# Patient Record
Sex: Male | Born: 1935
Health system: Southern US, Community
[De-identification: ages and names within clinical notes are randomized; demographics above are authoritative.]

## PROBLEM LIST (undated history)

## (undated) DIAGNOSIS — I4510 Unspecified right bundle-branch block: Secondary | ICD-10-CM

## (undated) DIAGNOSIS — R972 Elevated prostate specific antigen [PSA]: Secondary | ICD-10-CM

## (undated) DIAGNOSIS — I5189 Other ill-defined heart diseases: Secondary | ICD-10-CM

## (undated) DIAGNOSIS — I10 Essential (primary) hypertension: Secondary | ICD-10-CM

## (undated) DIAGNOSIS — Z9289 Personal history of other medical treatment: Secondary | ICD-10-CM

## (undated) DIAGNOSIS — I493 Ventricular premature depolarization: Secondary | ICD-10-CM

## (undated) DIAGNOSIS — C61 Malignant neoplasm of prostate: Secondary | ICD-10-CM

## (undated) HISTORY — DX: Essential (primary) hypertension: I10

## (undated) HISTORY — DX: Personal history of other medical treatment: Z92.89

## (undated) HISTORY — DX: Ventricular premature depolarization: I49.3

## (undated) HISTORY — DX: Elevated prostate specific antigen (PSA): R97.20

## (undated) HISTORY — DX: Unspecified right bundle-branch block: I45.10

## (undated) HISTORY — PX: NO PAST SURGERIES: SHX2092

## (undated) HISTORY — DX: Other ill-defined heart diseases: I51.89

## (undated) HISTORY — DX: Malignant neoplasm of prostate: C61

---

## 2015-05-22 DIAGNOSIS — M7520 Bicipital tendinitis, unspecified shoulder: Secondary | ICD-10-CM | POA: Insufficient documentation

## 2016-09-14 ENCOUNTER — Ambulatory Visit (INDEPENDENT_AMBULATORY_CARE_PROVIDER_SITE_OTHER): Payer: Medicare Other | Admitting: Family Medicine

## 2016-09-14 ENCOUNTER — Encounter: Payer: Self-pay | Admitting: Family Medicine

## 2016-09-14 VITALS — BP 142/72 | HR 40 | Temp 97.4°F | Ht 69.0 in | Wt 170.4 lb

## 2016-09-14 DIAGNOSIS — Z0001 Encounter for general adult medical examination with abnormal findings: Secondary | ICD-10-CM | POA: Insufficient documentation

## 2016-09-14 DIAGNOSIS — Z0110 Encounter for examination of ears and hearing without abnormal findings: Secondary | ICD-10-CM | POA: Insufficient documentation

## 2016-09-14 DIAGNOSIS — R001 Bradycardia, unspecified: Secondary | ICD-10-CM

## 2016-09-14 NOTE — Progress Notes (Signed)
Pre visit review using our clinic review tool, if applicable. No additional management support is needed unless otherwise documented below in the visit note. 

## 2016-09-14 NOTE — Patient Instructions (Signed)
We will call with your labs.  Follow up annually.  Take care  Dr. Adriana Simas   Health Maintenance, Male A healthy lifestyle and preventive care is important for your health and wellness. Ask your health care provider about what schedule of regular examinations is right for you. What should I know about weight and diet?  Eat a Healthy Diet  Eat plenty of vegetables, fruits, whole grains, low-fat dairy products, and lean protein.  Do not eat a lot of foods high in solid fats, added sugars, or salt. Maintain a Healthy Weight  Regular exercise can help you achieve or maintain a healthy weight. You should:  Do at least 150 minutes of exercise each week. The exercise should increase your heart rate and make you sweat (moderate-intensity exercise).  Do strength-training exercises at least twice a week. Watch Your Levels of Cholesterol and Blood Lipids  Have your blood tested for lipids and cholesterol every 5 years starting at 81 years of age. If you are at high risk for heart disease, you should start having your blood tested when you are 81 years old. You may need to have your cholesterol levels checked more often if:  Your lipid or cholesterol levels are high.  You are older than 81 years of age.  You are at high risk for heart disease. What should I know about cancer screening? Many types of cancers can be detected early and may often be prevented. Lung Cancer  You should be screened every year for lung cancer if:  You are a current smoker who has smoked for at least 30 years.  You are a former smoker who has quit within the past 15 years.  Talk to your health care provider about your screening options, when you should start screening, and how often you should be screened. Colorectal Cancer  Routine colorectal cancer screening usually begins at 81 years of age and should be repeated every 5-10 years until you are 81 years old. You may need to be screened more often if early forms  of precancerous polyps or small growths are found. Your health care provider may recommend screening at an earlier age if you have risk factors for colon cancer.  Your health care provider may recommend using home test kits to check for hidden blood in the stool.  A small camera at the end of a tube can be used to examine your colon (sigmoidoscopy or colonoscopy). This checks for the earliest forms of colorectal cancer. Prostate and Testicular Cancer  Depending on your age and overall health, your health care provider may do certain tests to screen for prostate and testicular cancer.  Talk to your health care provider about any symptoms or concerns you have about testicular or prostate cancer. Skin Cancer  Check your skin from head to toe regularly.  Tell your health care provider about any new moles or changes in moles, especially if:  There is a change in a mole's size, shape, or color.  You have a mole that is larger than a pencil eraser.  Always use sunscreen. Apply sunscreen liberally and repeat throughout the day.  Protect yourself by wearing long sleeves, pants, a wide-brimmed hat, and sunglasses when outside. What should I know about heart disease, diabetes, and high blood pressure?  If you are 22-51 years of age, have your blood pressure checked every 3-5 years. If you are 91 years of age or older, have your blood pressure checked every year. You should have your blood pressure  measured twice-once when you are at a hospital or clinic, and once when you are not at a hospital or clinic. Record the average of the two measurements. To check your blood pressure when you are not at a hospital or clinic, you can use:  An automated blood pressure machine at a pharmacy.  A home blood pressure monitor.  Talk to your health care provider about your target blood pressure.  If you are between 6-43 years old, ask your health care provider if you should take aspirin to prevent heart  disease.  Have regular diabetes screenings by checking your fasting blood sugar level.  If you are at a normal weight and have a low risk for diabetes, have this test once every three years after the age of 80.  If you are overweight and have a high risk for diabetes, consider being tested at a younger age or more often.  A one-time screening for abdominal aortic aneurysm (AAA) by ultrasound is recommended for men aged 76-75 years who are current or former smokers. What should I know about preventing infection? Hepatitis B  If you have a higher risk for hepatitis B, you should be screened for this virus. Talk with your health care provider to find out if you are at risk for hepatitis B infection. Hepatitis C  Blood testing is recommended for:  Everyone born from [birth year] through 1963.  Anyone with known risk factors for hepatitis C. Sexually Transmitted Diseases (STDs)  You should be screened each year for STDs including gonorrhea and chlamydia if:  You are sexually active and are younger than 80 years of age.  You are older than 81 years of age and your health care provider tells you that you are at risk for this type of infection.  Your sexual activity has changed since you were last screened and you are at an increased risk for chlamydia or gonorrhea. Ask your health care provider if you are at risk.  Talk with your health care provider about whether you are at high risk of being infected with HIV. Your health care provider may recommend a prescription medicine to help prevent HIV infection. What else can I do?  Schedule regular health, dental, and eye exams.  Stay current with your vaccines (immunizations).  Do not use any tobacco products, such as cigarettes, chewing tobacco, and e-cigarettes. If you need help quitting, ask your health care provider.  Limit alcohol intake to no more than 2 drinks per day. One drink equals 12 ounces of beer, 5 ounces of wine, or 1 ounces of hard  liquor.  Do not use street drugs.  Do not share needles.  Ask your health care provider for help if you need support or information about quitting drugs.  Tell your health care provider if you often feel depressed.  Tell your health care provider if you have ever been abused or do not feel safe at home. This information is not intended to replace advice given to you by your health care provider. Make sure you discuss any questions you have with your health care provider. Document Released: 10/29/2007 Document Revised: 12/30/2015 Document Reviewed: 02/03/2015 Elsevier Interactive Patient Education  2017 [employer].

## 2016-09-14 NOTE — Progress Notes (Signed)
   Subjective:  Patient ID: Joshua Sandoval, male    DOB: 26-Sep-1932  Age: 83 y.o. MRN: [id number]  CC: Establish care/physical exam   HPI [name] is a 83 y.o. male presents to the clinic today to establish care.  Preventative Healthcare  Colonoscopy: No longer indicated given age.   Immunizations - Declines immunizations.  Labs: Screening labs today.   Exercise: Tries to stay active; walking, playing golf.  Alcohol use: No.  Smoking/tobacco use: No.  PMH, Surgical Hx, Family Hx, Social History reviewed and updated as below.  PMH - Patient denies any PMH.  Past Surgical History:  Procedure Laterality Date  . NO PAST SURGERIES     Family History  Problem Relation Age of Onset  . Hypertension Mother    Social History  Substance Use Topics  . Smoking status: Never Smoker  . Smokeless tobacco: Never Used  . Alcohol use No   Review of Systems  Gastrointestinal:       Gas.  All other systems reviewed and are negative.   Objective:   Today's Vitals: BP (!) 142/72   Pulse (!) 40   Temp 97.4 F (36.3 C) (Oral)   Ht  (1.753 m)   Wt 170 lb 6 oz (77.3 kg)   SpO2 93%   BMI 25.16 kg/m   Physical Exam  Constitutional: He is oriented to person, place, and time. He appears well-developed. No distress.  HENT:  Head: Normocephalic and atraumatic.  Mouth/Throat: Oropharynx is clear and moist.  Eyes: Conjunctivae are normal.  Neck: Neck supple. No thyromegaly present.  Cardiovascular:  Regularly irregular. Bradycardic. 1-2/6 systolic murmur.  Pulmonary/Chest: Effort normal. He has no wheezes. He has no rales.  Abdominal: Soft. He exhibits no distension. There is no tenderness. There is no rebound and no guarding.  Musculoskeletal: Normal range of motion.  Neurological: He is alert and oriented to person, place, and time.  Skin: No rash noted.  Psychiatric: He has a normal mood and affect.  Vitals reviewed.  Assessment & Plan:   Problem List Items Addressed This  Visit    Encounter for health maintenance examination with abnormal findings - Primary    Declined immunizations. Screening labs today. Bradycardia noted on exam. As a result, EKG was obtained. It revealed right bundle-branch block and frequent PVC's. BP slightly elevated but reasonably well controlled given age. Will continue to monitor.       Relevant Orders   CBC   Hemoglobin A1c   Comprehensive metabolic panel   Lipid panel    Other Visit Diagnoses    Bradycardia       Relevant Orders   EKG 12-Lead (Completed)      Follow-up: Annual  Everlene Other DO [facility]

## 2016-09-14 NOTE — Assessment & Plan Note (Signed)
Declined immunizations. Screening labs today. Bradycardia noted on exam. As a result, EKG was obtained. It revealed right bundle-branch block and frequent PVC's. BP slightly elevated but reasonably well controlled given age. Will continue to monitor.

## 2016-09-14 NOTE — Addendum Note (Signed)
Addended by: Warden Fillers on: 09/14/2016 10:06 AM   Modules accepted: Orders

## 2017-12-18 ENCOUNTER — Ambulatory Visit: Payer: Medicare Other | Admitting: Family

## 2017-12-18 ENCOUNTER — Encounter: Payer: Self-pay | Admitting: Family

## 2017-12-18 VITALS — BP 165/90 | HR 87 | Temp 97.7°F | Resp 16 | Ht 68.0 in | Wt 165.2 lb

## 2017-12-18 DIAGNOSIS — R030 Elevated blood-pressure reading, without diagnosis of hypertension: Secondary | ICD-10-CM

## 2017-12-18 DIAGNOSIS — I10 Essential (primary) hypertension: Secondary | ICD-10-CM | POA: Diagnosis not present

## 2017-12-18 MED ORDER — AMLODIPINE BESYLATE 5 MG PO TABS: 5.0000 mg | ORAL_TABLET | Freq: Every day | ORAL | 3 refills | Status: DC

## 2017-12-18 NOTE — Progress Notes (Signed)
Subjective:    Patient ID: Joshua Sandoval, male    DOB: [birth year], 84 y.o.   MRN: [id number]  CC: Joshua Sandoval is a 84 y.o. male who presents today to establish care.    HPI: No h/o HTN. Feels well today.    Accompanied by wife.   Home care nurse checked blood pressure last week and it 190/93.   Denies exertional chest pain or pressure, numbness or tingling radiating to left arm or jaw, palpitations, dizziness, frequent headaches, changes in vision, or shortness of breath.  folllowing with Dr Hyacinth Meeker for right arm pain. No pain in arm today.        Seen at [facility] clinic for elevated blood pressure. 164/96. PVCsreferred to cardiology HISTORY:  History reviewed. No pertinent past medical history. Past Surgical History:  Procedure Laterality Date  . NO PAST SURGERIES     Family History  Problem Relation Age of Onset  . Hypertension Mother     Allergies: Other Current Outpatient Medications on File Prior to Visit  Medication Sig Dispense Refill  . aspirin EC 81 MG tablet Take 81 mg by mouth daily.     No current facility-administered medications on file prior to visit.     Social History   Tobacco Use  . Smoking status: Never Smoker  . Smokeless tobacco: Never Used  Substance Use Topics  . Alcohol use: No  . Drug use: No    Review of Systems  Constitutional: Negative for chills and fever.  Eyes: Negative for visual disturbance.  Respiratory: Negative for cough and shortness of breath.   Cardiovascular: Negative for chest pain and palpitations.  Gastrointestinal: Negative for nausea and vomiting.  Neurological: Negative for dizziness and headaches.      Objective:    BP (!) 165/90   Pulse 87   Temp 97.7 F (36.5 C) (Oral)   Resp 16   Ht 5\' 8"  (1.727 m)   Wt 165 lb 4 oz (75 kg)   SpO2 98%   BMI 25.13 kg/m  BP Readings from Last 3 Encounters:  12/18/17 (!) 165/90  09/14/16 (!) 142/72   Wt Readings from Last 3 Encounters:  12/18/17 165 lb 4 oz (75 kg)    09/14/16 170 lb 6 oz (77.3 kg)    Physical Exam  Constitutional: He appears well-developed and well-nourished.  HENT:  Right Ear: Hearing normal.  Left Ear: Hearing normal.  Mouth/Throat: Uvula is midline, oropharynx is clear and moist and mucous membranes are normal. No posterior oropharyngeal edema or posterior oropharyngeal erythema.  Eyes: Pupils are equal, round, and reactive to light. Conjunctivae, EOM and lids are normal. Lids are everted and swept, no foreign bodies found.  Normal fundus bilaterally.  Cardiovascular: Normal heart sounds. An irregular rhythm present.  Pulmonary/Chest: Effort normal and breath sounds normal. No respiratory distress. He has no wheezes. He has no rhonchi. He has no rales.  Lymphadenopathy:       Head (right side): No submental, no submandibular, no tonsillar, no preauricular, no posterior auricular and no occipital adenopathy present.       Head (left side): No submental, no submandibular, no tonsillar, no preauricular, no posterior auricular and no occipital adenopathy present.    He has no cervical adenopathy.  Neurological: He is alert. He has normal strength. No cranial nerve deficit or sensory deficit. He displays a negative Romberg sign.  Reflex Scores:      Bicep reflexes are 2+ on the right side and 2+ on the left  side.      Patellar reflexes are 2+ on the right side and 2+ on the left side. Grip equal and strong bilateral upper extremities. Gait strong and steady. Able to perform rapid alternating movement without difficulty.  Skin: Skin is warm and dry.  Psychiatric: He has a normal mood and affect. His speech is normal and behavior is normal.  Vitals reviewed.      Assessment & Plan:   Problem List Items Addressed This Visit      Cardiovascular and Mediastinum   HTN (hypertension) - Primary    Elevated today however improved as patient rested in exam room.  I am reassured by no signs or symptoms of hypertensive emergency or urgency  at this time.  EKG today shows sinus rhythm.  No ischemia or acute changes seen when compared to prior EKG of May 2018.  Patient is pending a cardiology referral this week.  I am concerned by his right bundle branch block and his irregular heart rate.  Advised patient and wife today to maintain very close vigilance in this interim while he is being evaluated.  Information regarding heart attack, stroke given to patient with strong emphasis on going to emergency room or call 911 if any symptoms present.  We will go ahead and start patient on low-dose amlodipine with close follow-up. Discussed with Dr Darrick Huntsmanullo regarding R BBB and other features to consider ( pulmonary HTN, OSA). Will discuss whether further evaluation appropriate at follow up after patient has been evaluated by cardiology.      Relevant Medications   aspirin EC 81 MG tablet   amLODipine (NORVASC) 5 MG tablet   Other Relevant Orders   Ambulatory referral to Cardiology       I am having Joshua Sandoval start on amLODipine. I am also having him maintain his aspirin EC.   Meds ordered this encounter  Medications  . amLODipine (NORVASC) 5 MG tablet    Sig: Take 1 tablet (5 mg total) by mouth daily.    Dispense:  90 tablet    Refill:  3    Order Specific Question:   Supervising Provider    Answer:   Sherlene ShamsULLO, TERESA L [[id number]]    Return precautions given.   Risks, benefits, and alternatives of the medications and treatment plan prescribed today were discussed, and patient expressed understanding.   Education regarding symptom management and diagnosis given to patient on AVS.  Continue to follow with Allegra GranaArnett, Paticia Moster G, FNP for routine health maintenance.   Joshua ArgyleEarl Sandoval and I agreed with plan.   Rennie PlowmanMargaret Jenie Parish, FNP

## 2017-12-18 NOTE — Patient Instructions (Addendum)
I am very concerned about your blood pressure today as we discussed at length.  Goal is to slowly bring  blood pressure down.  We will start the amlodipine 5 mg and you need to take this once a day.    As we also discussed, please make remain very vigilant regarding any signs or symptoms to suggest heart attack or stroke.  Literature given below.    Will await appointment with cardiology and we work to move earlier.    Stroke Prevention Some medical conditions and behaviors are associated with a higher chance of having a stroke. You can help prevent a stroke by making nutrition, lifestyle, and other changes, including managing any medical conditions you may have. What nutrition changes can be made?  Eat healthy foods. You can do this by: ? Choosing foods high in fiber, such as fresh fruits and vegetables and whole grains. ? Eating at least 5 or more servings of fruits and vegetables a day. Try to fill half of your plate at each meal with fruits and vegetables. ? Choosing lean protein foods, such as lean cuts of meat, poultry without skin, fish, tofu, beans, and nuts. ? Eating low-fat dairy products. ? Avoiding foods that are high in salt (sodium). This can help lower blood pressure. ? Avoiding foods that have saturated fat, trans fat, and cholesterol. This can help prevent high cholesterol. ? Avoiding processed and premade foods.  Follow your health care provider's specific guidelines for losing weight, controlling high blood pressure (hypertension), lowering high cholesterol, and managing diabetes. These may include: ? Reducing your daily calorie intake. ? Limiting your daily sodium intake to 1,500 milligrams (mg). ? Using only healthy fats for cooking, such as olive oil, canola oil, or sunflower oil. ? Counting your daily carbohydrate intake. What lifestyle changes can be made?  Maintain a healthy weight. Talk to your health care provider about your ideal weight.  Get at least 30  minutes of moderate physical activity at least 5 days a week. Moderate activity includes brisk walking, biking, and swimming.  Do not use any products that contain nicotine or tobacco, such as cigarettes and e-cigarettes. If you need help quitting, ask your health care provider. It may also be helpful to avoid exposure to secondhand smoke.  Limit alcohol intake to no more than 1 drink a day for nonpregnant women and 2 drinks a day for men. One drink equals 12 oz of beer, 5 oz of wine, or 1 oz of hard liquor.  Stop any illegal drug use.  Avoid taking birth control pills. Talk to your health care provider about the risks of taking birth control pills if: ? You are over 49 years old. ? You smoke. ? You get migraines. ? You have ever had a blood clot. What other changes can be made?  Manage your cholesterol levels. ? Eating a healthy diet is important for preventing high cholesterol. If cholesterol cannot be managed through diet alone, you may also need to take medicines. ? Take any prescribed medicines to control your cholesterol as told by your health care provider.  Manage your diabetes. ? Eating a healthy diet and exercising regularly are important parts of managing your blood sugar. If your blood sugar cannot be managed through diet and exercise, you may need to take medicines. ? Take any prescribed medicines to control your diabetes as told by your health care provider.  Control your hypertension. ? To reduce your risk of stroke, try to keep your blood pressure  below 130/80. ? Eating a healthy diet and exercising regularly are an important part of controlling your blood pressure. If your blood pressure cannot be managed through diet and exercise, you may need to take medicines. ? Take any prescribed medicines to control hypertension as told by your health care provider. ? Ask your health care provider if you should monitor your blood pressure at home. ? Have your blood pressure checked  every year, even if your blood pressure is normal. Blood pressure increases with age and some medical conditions.  Get evaluated for sleep disorders (sleep apnea). Talk to your health care provider about getting a sleep evaluation if you snore a lot or have excessive sleepiness.  Take over-the-counter and prescription medicines only as told by your health care provider. Aspirin or blood thinners (antiplatelets or anticoagulants) may be recommended to reduce your risk of forming blood clots that can lead to stroke.  Make sure that any other medical conditions you have, such as atrial fibrillation or atherosclerosis, are managed. What are the warning signs of a stroke? The warning signs of a stroke can be easily remembered as BEFAST.  B is for balance. Signs include: ? Dizziness. ? Loss of balance or coordination. ? Sudden trouble walking.  E is for eyes. Signs include: ? A sudden change in vision. ? Trouble seeing.  F is for face. Signs include: ? Sudden weakness or numbness of the face. ? The face or eyelid drooping to one side.  A is for arms. Signs include: ? Sudden weakness or numbness of the arm, usually on one side of the body.  S is for speech. Signs include: ? Trouble speaking (aphasia). ? Trouble understanding.  T is for time. ? These symptoms may represent a serious problem that is an emergency. Do not wait to see if the symptoms will go away. Get medical help right away. Call your local emergency services (911 in the [place] Do not drive yourself to the hospital.  Other signs of stroke may include: ? A sudden, severe headache with no known cause. ? Nausea or vomiting. ? Seizure.  Where to find more information: For more information, visit:  American Stroke Association: www.strokeassociation.org  National Stroke Association: www.stroke.org  Summary  You can prevent a stroke by eating healthy, exercising, not smoking, limiting alcohol intake, and managing any  medical conditions you may have.  Do not use any products that contain nicotine or tobacco, such as cigarettes and e-cigarettes. If you need help quitting, ask your health care provider. It may also be helpful to avoid exposure to secondhand smoke.  Remember BEFAST for warning signs of stroke. Get help right away if you or a loved one has any of these signs. This information is not intended to replace advice given to you by your health care provider. Make sure you discuss any questions you have with your health care provider. Document Released: 06/09/2004 Document Revised: 06/07/2016 Document Reviewed: 06/07/2016 Elsevier Interactive Patient Education  2018 [employer]  Heart Attack A heart attack (myocardial infarction, MI) causes damage to the heart that cannot be fixed. A heart attack often happens when a blood clot or other blockage cuts blood flow to the heart. When this happens, certain areas of the heart begin to die. This causes the pain you feel during a heart attack. Follow these instructions at home:  Take medicine as told by your doctor. You may need medicine to: ? Keep your blood from clotting too easily. ? Control your blood pressure. ?  Lower your cholesterol. ? Control abnormal heart rhythms.  Change certain behaviors as told by your doctor. This may include: ? Quitting smoking. ? Being active. ? Eating a heart-healthy diet. Ask your doctor for help with this diet. ? Keeping a healthy weight. ? Keeping your diabetes under control. ? Lessening stress. ? Limiting how much alcohol you drink. Do not take these medicines unless your doctor says that you can:  Nonsteroidal anti-inflammatory drugs (NSAIDs). These include: ? Ibuprofen. ? Naproxen. ? Celecoxib.  Vitamin supplements that have vitamin A, vitamin E, or both.  Hormone therapy that contains estrogen with or without progestin.  Get help right away if:  You have sudden chest discomfort.  You have sudden  discomfort in your: ? Arms. ? Back. ? Neck. ? Jaw.  You have shortness of breath at any time.  You have sudden sweating or clammy skin.  You feel sick to your stomach (nauseous) or throw up (vomit).  You suddenly get light-headed or dizzy.  You feel your heart beating fast or skipping beats. These symptoms may be an emergency. Do not wait to see if the symptoms will go away. Get medical help right away. Call your local emergency services (911 in the [place] Do not drive yourself to the hospital. This information is not intended to replace advice given to you by your health care provider. Make sure you discuss any questions you have with your health care provider. Document Released: 11/01/2011 Document Revised: 10/08/2015 Document Reviewed: 07/05/2013 Elsevier Interactive Patient Education  2017 [employer].

## 2017-12-20 NOTE — Assessment & Plan Note (Addendum)
Elevated today however improved as patient rested in exam room.  I am reassured by no signs or symptoms of hypertensive emergency or urgency at this time.  EKG today shows sinus rhythm.  No ischemia or acute changes seen when compared to prior EKG of May 2018.  Patient is pending a cardiology referral this week.  I am concerned by his right bundle branch block and his irregular heart rate.  Advised patient and wife today to maintain very close vigilance in this interim while he is being evaluated.  Information regarding heart attack, stroke given to patient with strong emphasis on going to emergency room or call 911 if any symptoms present.  We will go ahead and start patient on low-dose amlodipine with close follow-up. Discussed with Dr Darrick Huntsmanullo regarding R BBB and other features to consider ( pulmonary HTN, OSA). Will discuss whether further evaluation appropriate at follow up after patient has been evaluated by cardiology.

## 2017-12-21 ENCOUNTER — Encounter

## 2017-12-21 ENCOUNTER — Encounter: Payer: Self-pay | Admitting: Internal Medicine

## 2017-12-21 ENCOUNTER — Ambulatory Visit: Payer: Medicare Other | Admitting: Internal Medicine

## 2017-12-21 VITALS — BP 170/80 | HR 90 | Ht 70.5 in | Wt 162.5 lb

## 2017-12-21 DIAGNOSIS — I10 Essential (primary) hypertension: Secondary | ICD-10-CM | POA: Diagnosis not present

## 2017-12-21 DIAGNOSIS — I4510 Unspecified right bundle-branch block: Secondary | ICD-10-CM

## 2017-12-21 NOTE — Progress Notes (Signed)
New Outpatient Visit Date: 12/21/2017  Referring Provider: Rennie PlowmanMargaret Arnett, NP [address] [place] [id number]  Chief Complaint: High blood pressure  HPI:  [name] is a 84 y.o. male who is being seen today for the evaluation of hypertension at the request of Rennie PlowmanMargaret Arnett, NP. He has a history of recently diagnosed hypertension.  He notes that he has not followed with physicians regularly.  He was recently seen by a [facility] health nurse from [facility], who noted significantly elevated blood pressure and irregular heartbeat.  [name] was advised to go to the ER and he subsequently presented to urgent care where he was noted to be hypertensive.  He was placed on amlodipine and followed up with Ms. Arnett in the office.  He is now taking amlodipine for 3 days and is tolerating it well.  He is without complaints, denying chest pain, shortness of breath, palpitations, lightheadedness, orthopnea, PND, and edema.  Mr. Arloa KohDaye's only complaint is of intermittent right shoulder pain that has been present for more than a year.  He states that he fell and injured his right chest and shoulder in the remote past and continues to have occasional pain when he moves his right arm in certain ways.  He does not have any exertional chest pain.  [facility].  If anything, he feels like his energy and stamina have improved with regular exercise.  --------------------------------------------------------------------------------------------------  Cardiovascular History & Procedures: Cardiovascular Problems:  Hypertension  Risk Factors:  Hypertension, male gender, and age greater than 5355  Cath/PCI:  None  CV Surgery:  None  EP Procedures and Devices:  None  Non-Invasive Evaluation(s):  None  --------------------------------------------------------------------------------------------------  Past Medical History:  Diagnosis Date  .  Hypertension     Past Surgical History:  Procedure Laterality Date  . NO PAST SURGERIES      No outpatient medications have been marked as taking for the 12/21/17 encounter (Office Visit) with Torii Royse, Cristal Deerhristopher, MD.    Allergies: Other  Social History   Tobacco Use  . Smoking status: Former Smoker    Types: Cigarettes    Last attempt to quit: 1980    Years since quitting: 39.6  . Smokeless tobacco: Never Used  Substance Use Topics  . Alcohol use: Not Currently  . Drug use: No    Family History  Problem Relation Age of Onset  . Hypertension Mother   . Stroke Mother     Review of Systems: A 12-system review of systems was performed and was negative except as noted in the HPI.  --------------------------------------------------------------------------------------------------  Physical Exam: BP (!) 170/80 (BP Location: Right Arm, Patient Position: Sitting, Cuff Size: Normal)   Pulse 90   Ht 5' 10.5" (1.791 m)   Wt 162 lb 8 oz (73.7 kg)   SpO2 98%   BMI 22.99 kg/m   General: NAD.  Accompanied by his wife. HEENT: No conjunctival pallor or scleral icterus. Moist mucous membranes. OP clear. Neck: Supple without lymphadenopathy, thyromegaly, JVD, or HJR. No carotid bruit. Lungs: Normal work of breathing. Clear to auscultation bilaterally without wheezes or crackles. Heart: Regular rate and rhythm with occasional extrasystoles.  No murmurs, rubs, or gallops. Non-displaced PMI. Abd: Bowel sounds present. Soft, NT/ND without hepatosplenomegaly Ext: No lower extremity edema. Radial, PT, and DP pulses are 2+ bilaterally Skin: Warm and dry without rash. Neuro: CNIII-XII intact. Strength and fine-touch sensation intact in upper and lower extremities bilaterally. Psych: Normal mood and  affect.  EKG: Normal sinus rhythm with left axis deviation and RBBB.  Borderline LVH.  No results found for: WBC, HGB, HCT, MCV, PLT  No results found for: NA, K, CL, CO2, BUN, CREATININE,  GLUCOSE, ALT  No results found for: CHOL, HDL, LDLCALC, LDLDIRECT, TRIG, CHOLHDL   --------------------------------------------------------------------------------------------------  ASSESSMENT AND PLAN: Essential hypertension and right bundle branch block Recently diagnosed, though likely long-standing given lack of medical care for many years.  Fortunately, the patient is asymptomatic.  We have discussed the importance of blood pressure control for prevention of cardiovascular disease.  EKG shows some changes that could be associated with hypertensive heart disease including right bundle branch block and borderline LVH.  We have agreed to obtain a transthoracic echocardiogram for further evaluation.  I think is reasonable to continue amlodipine, which was just recently started.  I have also counseled Mr. Maish on sodium restriction and have provided him with information about the DASH diet.  If his blood pressure remains suboptimally controlled follow-up, escalation of amlodipine and or addition of a second agent will need to be considered.  We will defer additional risk stratification such as lipid panel for now, though this will need to be considered in the future.  Follow-up: Return to clinic in 1 month.  Yvonne Kendall, MD 12/23/2017 10:32 AM

## 2017-12-21 NOTE — Patient Instructions (Addendum)
Medication Instructions:  Your physician recommends that you continue on your current medications as directed. Please refer to the Current Medication list given to you today.   Labwork: none  Testing/Procedures: Your physician has requested that you have an echocardiogram. Echocardiography is a painless test that uses sound waves to create images of your heart. It provides your doctor with information about the size and shape of your heart and how well your heart's chambers and valves are working. This procedure takes approximately one hour. There are no restrictions for this procedure. You may get an IV, if needed, to receive an ultrasound enhancing agent through to better visualize your heart.    Follow-Up: Your physician recommends that you schedule a follow-up appointment in: 1 MONTH WITH DR END OR APP.    Echocardiogram An echocardiogram, or echocardiography, uses sound waves (ultrasound) to produce an image of your heart. The echocardiogram is simple, painless, obtained within a short period of time, and offers valuable information to your health care provider. The images from an echocardiogram can provide information such as:  Evidence of coronary artery disease (CAD).  Heart size.  Heart muscle function.  Heart valve function.  Aneurysm detection.  Evidence of a past heart attack.  Fluid buildup around the heart.  Heart muscle thickening.  Assess heart valve function.  Tell a health care provider about:  Any allergies you have.  All medicines you are taking, including vitamins, herbs, eye drops, creams, and over-the-counter medicines.  Any problems you or family members have had with anesthetic medicines.  Any blood disorders you have.  Any surgeries you have had.  Any medical conditions you have.  Whether you are pregnant or may be pregnant. What happens before the procedure? No special preparation is needed. Eat and drink normally. What happens during  the procedure?  In order to produce an image of your heart, gel will be applied to your chest and a wand-like tool (transducer) will be moved over your chest. The gel will help transmit the sound waves from the transducer. The sound waves will harmlessly bounce off your heart to allow the heart images to be captured in real-time motion. These images will then be recorded.  You may need an IV to receive a medicine that improves the quality of the pictures. What happens after the procedure? You may return to your normal schedule including diet, activities, and medicines, unless your health care provider tells you otherwise. This information is not intended to replace advice given to you by your health care provider. Make sure you discuss any questions you have with your health care provider. Document Released: 04/29/2000 Document Revised: 12/19/2015 Document Reviewed: 01/07/2013 Elsevier Interactive Patient Education  2017 Elsevier Inc.  DASH Eating Plan DASH stands for "Dietary Approaches to Stop Hypertension." The DASH eating plan is a healthy eating plan that has been shown to reduce high blood pressure (hypertension). It may also reduce your risk for type 2 diabetes, heart disease, and stroke. The DASH eating plan may also help with weight loss. What are tips for following this plan? General guidelines  Avoid eating more than 2,300 mg (milligrams) of salt (sodium) a day. If you have hypertension, you may need to reduce your sodium intake to 1,500 mg a day.  Limit alcohol intake to no more than 1 drink a day for nonpregnant women and 2 drinks a day for men. One drink equals 12 oz of beer, 5 oz of wine, or 1 oz of hard liquor.  Work  with your health care provider to maintain a healthy body weight or to lose weight. Ask what an ideal weight is for you.  Get at least 30 minutes of exercise that causes your heart to beat faster (aerobic exercise) most days of the week. Activities may include  walking, swimming, or biking.  Work with your health care provider or diet and nutrition specialist (dietitian) to adjust your eating plan to your individual calorie needs. Reading food labels  Check food labels for the amount of sodium per serving. Choose foods with less than 5 percent of the Daily Value of sodium. Generally, foods with less than 300 mg of sodium per serving fit into this eating plan.  To find whole grains, look for the word "whole" as the first word in the ingredient list. Shopping  Buy products labeled as "low-sodium" or "no salt added."  Buy fresh foods. Avoid canned foods and premade or frozen meals. Cooking  Avoid adding salt when cooking. Use salt-free seasonings or herbs instead of table salt or sea salt. Check with your health care provider or pharmacist before using salt substitutes.  Do not fry foods. Cook foods using healthy methods such as baking, boiling, grilling, and broiling instead.  Cook with heart-healthy oils, such as olive, canola, soybean, or sunflower oil. Meal planning   Eat a balanced diet that includes: ? 5 or more servings of fruits and vegetables each day. At each meal, try to fill half of your plate with fruits and vegetables. ? Up to 6-8 servings of whole grains each day. ? Less than 6 oz of lean meat, poultry, or fish each day. A 3-oz serving of meat is about the same size as a deck of cards. One egg equals 1 oz. ? 2 servings of low-fat dairy each day. ? A serving of nuts, seeds, or beans 5 times each week. ? Heart-healthy fats. Healthy fats called Omega-3 fatty acids are found in foods such as flaxseeds and coldwater fish, like sardines, salmon, and mackerel.  Limit how much you eat of the following: ? Canned or prepackaged foods. ? Food that is high in trans fat, such as fried foods. ? Food that is high in saturated fat, such as fatty meat. ? Sweets, desserts, sugary drinks, and other foods with added sugar. ? Full-fat dairy  products.  Do not salt foods before eating.  Try to eat at least 2 vegetarian meals each week.  Eat more home-cooked food and less restaurant, buffet, and fast food.  When eating at a restaurant, ask that your food be prepared with less salt or no salt, if possible. What foods are recommended? The items listed may not be a complete list. Talk with your dietitian about what dietary choices are best for you. Grains Whole-grain or whole-wheat bread. Whole-grain or whole-wheat pasta. Brown rice. Orpah Cobb. Bulgur. Whole-grain and low-sodium cereals. Pita bread. Low-fat, low-sodium crackers. Whole-wheat flour tortillas. Vegetables Fresh or frozen vegetables (raw, steamed, roasted, or grilled). Low-sodium or reduced-sodium tomato and vegetable juice. Low-sodium or reduced-sodium tomato sauce and tomato paste. Low-sodium or reduced-sodium canned vegetables. Fruits All fresh, dried, or frozen fruit. Canned fruit in natural juice (without added sugar). Meat and other protein foods Skinless chicken or [place]. Ground chicken or [place]. Pork with fat trimmed off. Fish and seafood. Egg whites. Dried beans, peas, or lentils. Unsalted nuts, nut butters, and seeds. Unsalted canned beans. Lean cuts of beef with fat trimmed off. Low-sodium, lean deli meat. Dairy Low-fat (1%) or fat-free (skim) milk.  Fat-free, low-fat, or reduced-fat cheeses. Nonfat, low-sodium ricotta or cottage cheese. Low-fat or nonfat yogurt. Low-fat, low-sodium cheese. Fats and oils Soft margarine without trans fats. Vegetable oil. Low-fat, reduced-fat, or light mayonnaise and salad dressings (reduced-sodium). Canola, safflower, olive, soybean, and sunflower oils. Avocado. Seasoning and other foods Herbs. Spices. Seasoning mixes without salt. Unsalted popcorn and pretzels. Fat-free sweets. What foods are not recommended? The items listed may not be a complete list. Talk with your dietitian about what dietary choices are best for  you. Grains Baked goods made with fat, such as croissants, muffins, or some breads. Dry pasta or rice meal packs. Vegetables Creamed or fried vegetables. Vegetables in a cheese sauce. Regular canned vegetables (not low-sodium or reduced-sodium). Regular canned tomato sauce and paste (not low-sodium or reduced-sodium). Regular tomato and vegetable juice (not low-sodium or reduced-sodium). Rosita FirePickles. Olives. Fruits Canned fruit in a light or heavy syrup. Fried fruit. Fruit in cream or butter sauce. Meat and other protein foods Fatty cuts of meat. Ribs. Fried meat. Tomasa BlaseBacon. Sausage. Bologna and other processed lunch meats. Salami. Fatback. Hotdogs. Bratwurst. Salted nuts and seeds. Canned beans with added salt. Canned or smoked fish. Whole eggs or egg yolks. Chicken or [place] with skin. Dairy Whole or 2% milk, cream, and half-and-half. Whole or full-fat cream cheese. Whole-fat or sweetened yogurt. Full-fat cheese. Nondairy creamers. Whipped toppings. Processed cheese and cheese spreads. Fats and oils Butter. Stick margarine. Lard. Shortening. Ghee. Bacon fat. Tropical oils, such as coconut, palm kernel, or palm oil. Seasoning and other foods Salted popcorn and pretzels. Onion salt, garlic salt, seasoned salt, table salt, and sea salt. Worcestershire sauce. Tartar sauce. Barbecue sauce. Teriyaki sauce. Soy sauce, including reduced-sodium. Steak sauce. Canned and packaged gravies. Fish sauce. Oyster sauce. Cocktail sauce. Horseradish that you find on the shelf. Ketchup. Mustard. Meat flavorings and tenderizers. Bouillon cubes. Hot sauce and Tabasco sauce. Premade or packaged marinades. Premade or packaged taco seasonings. Relishes. Regular salad dressings. Where to find more information:  National Heart, Lung, and Blood Institute: PopSteam.iswww.nhlbi.nih.gov  American Heart Association: www.heart.org Summary  The DASH eating plan is a healthy eating plan that has been shown to reduce high blood pressure  (hypertension). It may also reduce your risk for type 2 diabetes, heart disease, and stroke.  With the DASH eating plan, you should limit salt (sodium) intake to 2,300 mg a day. If you have hypertension, you may need to reduce your sodium intake to 1,500 mg a day.  When on the DASH eating plan, aim to eat more fresh fruits and vegetables, whole grains, lean proteins, low-fat dairy, and heart-healthy fats.  Work with your health care provider or diet and nutrition specialist (dietitian) to adjust your eating plan to your individual calorie needs. This information is not intended to replace advice given to you by your health care provider. Make sure you discuss any questions you have with your health care provider. Document Released: 04/21/2011 Document Revised: 04/25/2016 Document Reviewed: 04/25/2016 Elsevier Interactive Patient Education  [employer]2018 Elsevier Inc.

## 2017-12-22 ENCOUNTER — Other Ambulatory Visit: Payer: Self-pay | Admitting: Internal Medicine

## 2017-12-23 ENCOUNTER — Encounter: Payer: Self-pay | Admitting: Internal Medicine

## 2017-12-23 DIAGNOSIS — I4510 Unspecified right bundle-branch block: Secondary | ICD-10-CM | POA: Insufficient documentation

## 2018-01-01 ENCOUNTER — Ambulatory Visit (INDEPENDENT_AMBULATORY_CARE_PROVIDER_SITE_OTHER): Payer: Medicare Other

## 2018-01-01 ENCOUNTER — Other Ambulatory Visit: Payer: Self-pay

## 2018-01-01 DIAGNOSIS — I4510 Unspecified right bundle-branch block: Secondary | ICD-10-CM

## 2018-01-01 DIAGNOSIS — I10 Essential (primary) hypertension: Secondary | ICD-10-CM | POA: Diagnosis not present

## 2018-01-22 ENCOUNTER — Ambulatory Visit: Payer: Medicare Other | Admitting: Family

## 2018-01-29 ENCOUNTER — Ambulatory Visit (INDEPENDENT_AMBULATORY_CARE_PROVIDER_SITE_OTHER): Payer: Medicare Other | Admitting: Nurse Practitioner

## 2018-01-29 ENCOUNTER — Encounter: Payer: Self-pay | Admitting: Nurse Practitioner

## 2018-01-29 VITALS — BP 128/60 | HR 88 | Ht 71.0 in | Wt 166.0 lb

## 2018-01-29 DIAGNOSIS — I5189 Other ill-defined heart diseases: Secondary | ICD-10-CM | POA: Diagnosis not present

## 2018-01-29 DIAGNOSIS — I493 Ventricular premature depolarization: Secondary | ICD-10-CM

## 2018-01-29 DIAGNOSIS — I10 Essential (primary) hypertension: Secondary | ICD-10-CM | POA: Diagnosis not present

## 2018-01-29 MED ORDER — METOPROLOL SUCCINATE ER 25 MG PO TB24: 12.5000 mg | ORAL_TABLET | Freq: Every day | ORAL | 1 refills | Status: DC

## 2018-01-29 NOTE — Progress Notes (Signed)
Office Visit    Patient Name: Joshua Sandoval Date of Encounter: 01/29/2018  Primary Care Provider:  Allegra GranaArnett, Margaret G, FNP Primary Cardiologist:  Yvonne Kendallhristopher End, MD  Chief Complaint    84 year old male who presents for follow-up related to hypertension and diastolic dysfunction.  Past Medical History    Past Medical History:  Diagnosis Date  . Diastolic dysfunction    a. 12/2017 Echo: EF 50-55%, no rwma, Gr1 DD, nl RV fxn.  . Hypertension   . PVC's (premature ventricular contractions)   . RBBB    Past Surgical History:  Procedure Laterality Date  . NO PAST SURGERIES      Allergies  Allergies  Allergen Reactions  . Other     NKDA    History of Present Illness    84 year old male with the above past medical history including recently diagnosed poorly controlled hypertension.  In that setting, he was placed on amlodipine therapy by his primary care provider and referred to Dr. Okey DupreEnd for evaluation.  He underwent echocardiography in August which showed low normal LV function with an EF of 50 to 55%.  Grade 1 diastolic dysfunction was noted.  Frequent PVCs occurred during the echocardiogram as well.  Since his last visit, he has done well.  He checks his blood pressure periodically and notes that it has been much better controlled.  Typically in the 120s.  [facility], mostly light weight training, 3 days a week.  He is not so a walking for exercise but is able to complete yard work such as push mowing without any chest pain or dyspnea.  He has frequent PVCs today and he denies palpitations, presyncope, or any history of syncope.  Further, he denies PND, orthopnea, edema, or early satiety.  Home Medications    Prior to Admission medications   Medication Sig Start Date End Date Taking? Authorizing Provider  amLODipine (NORVASC) 5 MG tablet Take 1 tablet (5 mg total) by mouth daily. 12/18/17   Allegra GranaArnett, Margaret G, FNP  aspirin EC 81 MG tablet Take 81 mg by  mouth daily.    [provider]    Review of Systems    He denies chest pain, palpitations, dyspnea, pnd, orthopnea, n, v, dizziness, syncope, edema, weight gain, or early satiety.  All other systems reviewed and are otherwise negative except as noted above.  Physical Exam    VS:  BP 128/60 (BP Location: Left Arm, Patient Position: Sitting, Cuff Size: Normal)   Pulse 88   Ht 5\' 11"  (1.803 m)   Wt 166 lb (75.3 kg)   BMI 23.15 kg/m  , BMI Body mass index is 23.15 kg/m. GEN: Well nourished, well developed, in no acute distress. HEENT: normal. Neck: Supple, no JVD, carotid bruits, or masses. Cardiac: Irreg, freq ectopy, no murmurs, rubs, or gallops. No clubbing, cyanosis, edema.  Radials/DP/PT 2+ and equal bilaterally.  Respiratory:  Respirations regular and unlabored, clear to auscultation bilaterally. GI: Soft, nontender, nondistended, BS + x 4. MS: no deformity or atrophy. Skin: warm and dry, no rash. Neuro:  Strength and sensation are intact. Psych: Normal affect.  Accessory Clinical Findings    ECG personally reviewed by me today -regular sinus rhythm, 88, left axis, right bundle branch block, frequent PVCs- no acute changes.  Assessment & Plan    1.  Essential hypertension: Blood pressure much better controlled on amlodipine 5 mg daily.  He has frequent PVCs and I am adding low-dose metoprolol as  well.  2.  Diastolic dysfunction: Recent echo showed low normal LV function with an EF of 50 to 55% and grade 1 diastolic dysfunction.  He is euvolemic on exam.  With frequent PVCs, adding low-dose beta-blocker.  3.  PVCs: Asymptomatic.  I will check a basic metabolic panel, magnesium, and TSH today.  I am also adding Toprol-XL 12.5 mg daily.  In light of frequency of PVCs, I will pursue an ischemic evaluation with exercise stress testing and also place a 24-hour Holter monitor to assess his burden of PVCs.  4.  Lipid status: Currently unknown.  I will check lipids and a  direct LDL today.  5.  Disposition: Follow-up lab work and testing as above.  Follow-up in clinic in 1 month or sooner if necessary.   Nicolasa Ducking, NP 01/29/2018, 2:54 PM

## 2018-01-29 NOTE — Patient Instructions (Addendum)
Medication Instructions: START Metoprolol (Toprol XL) 12.5 mg daily.  If you need a refill on your cardiac medications before your next appointment, please call your pharmacy.   Procedures: Your physician has recommended that you wear 24 holter monitor. Holter monitors are medical devices that record the heart's electrical activity. Doctors most often use these monitors to diagnose arrhythmias. Arrhythmias are problems with the speed or rhythm of the heartbeat. The monitor is a small, portable device. You can wear one while you do your normal daily activities. This is usually used to diagnose what is causing palpitations/syncope (passing out).  Your physician has requested that you have a lexiscan myoview. For further information please visit [email or web address]. Please follow instruction sheet, as given.   Labwork: Your provider would like for you to have the following labs today: BMET, TSH, Lipids, Direct LDL, Magnesium.  Follow-Up: Your physician wants you to follow-up in one month with Dr. Okey DupreEnd.   Instructions:   [facility] MY[facility]IEW  Your provider has ordered a Stress Test with nuclear imaging. The purpose of this test is to evaluate the blood supply to your heart muscle. This procedure is referred to as a "Non-Invasive Stress Test." This is because other than having an IV started in your vein, nothing is inserted or "invades" your body. Cardiac stress tests are done to find areas of poor blood flow to the heart by determining the extent of coronary artery disease (CAD). Some patients exercise on a treadmill, which naturally increases the blood flow to your heart, while others who are unable to walk on a treadmill due to physical limitations have a pharmacologic/chemical stress agent called Lexiscan . This medicine will mimic walking on a treadmill by temporarily increasing your coronary blood flow.   Please note: these test may take anywhere between 2-4 hours to complete  PLEASE REPORT TO [facility]  MEDICAL MALL ENTRANCE  THE VOLUNTEERS AT THE FIRST DESK WILL DIRECT YOU WHERE TO GO  Date of Procedure:_____________________________________  Arrival Time for Procedure:______________________________  Instructions regarding medication:   __X_:  Hold betablocker(s) night before procedure and morning of procedure (Hold Metoprolol)   PLEASE NOTIFY THE OFFICE AT LEAST 24 HOURS IN ADVANCE IF YOU ARE UNABLE TO KEEP YOUR APPOINTMENT.  [phone] AND  PLEASE NOTIFY [facility] AT [facility] AT LEAST 24 HOURS IN ADVANCE IF YOU ARE UNABLE TO KEEP YOUR APPOINTMENT. [phone]  How to prepare for your Myoview test:  1. Do not eat or drink after midnight 2. No caffeine for 24 hours prior to test 3. No smoking 24 hours prior to test. 4. Your medication may be taken with water.  If your doctor stopped a medication because of this test, do not take that medication. 5. Ladies, please do not wear dresses.  Skirts or pants are appropriate. Please wear a short sleeve shirt. 6. No perfume, cologne or lotion. 7. Wear comfortable walking shoes. No heels!    [facility] you for choosing [facility]!

## 2018-01-30 LAB — BASIC METABOLIC PANEL
BUN/Creatinine Ratio: 19 (ref 10–24)
BUN: 19 mg/dL (ref 8–27)
CALCIUM: 9.5 mg/dL (ref 8.6–10.2)
CO2: 23 mmol/L (ref 20–29)
Chloride: 100 mmol/L (ref 96–106)
Creatinine, Ser: 1.01 mg/dL (ref 0.76–1.27)
GFR calc non Af Amer: 68 mL/min/{1.73_m2} (ref >59)
GFR, EST AFRICAN AMERICAN: 79 mL/min/{1.73_m2} (ref >59)
Glucose: 145 mg/dL — ABNORMAL HIGH (ref 65–99)
Potassium: 4.1 mmol/L (ref 3.5–5.2)
Sodium: 139 mmol/L (ref 134–144)

## 2018-01-30 LAB — LIPID PANEL
CHOL/HDL RATIO: 3.8 ratio (ref 0.0–5.0)
Cholesterol, Total: 224 mg/dL — ABNORMAL HIGH (ref 100–199)
HDL: 59 mg/dL (ref >39)
LDL Calculated: 146 mg/dL — ABNORMAL HIGH (ref 0–99)
TRIGLYCERIDES: 93 mg/dL (ref 0–149)
VLDL Cholesterol Cal: 19 mg/dL (ref 5–40)

## 2018-01-30 LAB — TSH: TSH: 1.21 u[IU]/mL (ref 0.450–4.500)

## 2018-01-30 LAB — MAGNESIUM: Magnesium: 2 mg/dL (ref 1.6–2.3)

## 2018-01-30 LAB — LDL CHOLESTEROL, DIRECT: LDL Direct: 150 mg/dL — ABNORMAL HIGH (ref 0–99)

## 2018-01-31 ENCOUNTER — Telehealth: Payer: Self-pay | Admitting: Nurse Practitioner

## 2018-01-31 ENCOUNTER — Telehealth: Payer: Self-pay | Admitting: *Deleted

## 2018-01-31 NOTE — Telephone Encounter (Signed)
-----   Message from Creig Hineshristopher Ronald Berge, NP sent at 01/30/2018  3:57 PM EDT ----- Electrolytes, kidney function, thyroid function.  His cholesterol is elevated.  At the age of 84, it's less clear to what extent that he might benefit from a cholesterol medicine.  Let's wait and see how his stress test turns out and we can make a decision at that point.

## 2018-01-31 NOTE — Telephone Encounter (Signed)
Results called to pt. Pt verbalized understanding of results and plan of care.  

## 2018-01-31 NOTE — Telephone Encounter (Signed)
Pt is returning your call

## 2018-01-31 NOTE — Telephone Encounter (Signed)
lmov to schedule  °

## 2018-01-31 NOTE — Telephone Encounter (Signed)
No answer. Left message to call back.   

## 2018-01-31 NOTE — Telephone Encounter (Signed)
-----   Message from Sandi MariscalLisa Y Richardson, RN sent at 01/31/2018 10:54 AM EDT ----- Regarding: 24 hour monitor Patient needs to be scheduled for a 24 hour monitor, please. Thank you

## 2018-02-02 NOTE — Telephone Encounter (Signed)
Patient scheduled for 03/02/18   Nothing else needed.

## 2018-02-05 ENCOUNTER — Ambulatory Visit: Payer: Medicare Other | Admitting: Cardiovascular Disease

## 2018-02-06 ENCOUNTER — Encounter
Admission: RE | Admit: 2018-02-06 | Discharge: 2018-02-06 | Disposition: A | Discharge status: Home / Self Care | Payer: Medicare Other | Source: Ambulatory Visit | Attending: Nurse Practitioner | Admitting: Nurse Practitioner

## 2018-02-06 DIAGNOSIS — I493 Ventricular premature depolarization: Secondary | ICD-10-CM | POA: Insufficient documentation

## 2018-02-06 LAB — NM MYOCAR MULTI W/SPECT W/WALL MOTION / EF
CSEPPHR: 142 {beats}/min
Estimated workload: 4.6 METS
Exercise duration (min): 3 min
Exercise duration (sec): 35 s
LV dias vol: 105 mL (ref 62–150)
LV sys vol: 47 mL
NUC STRESS TID: 1.2
Percent HR: 103 %
Rest HR: 75 {beats}/min

## 2018-02-06 MED ORDER — TECHNETIUM TC 99M TETROFOSMIN IV KIT
10.0000 | PACK | Freq: Once | INTRAVENOUS | Status: AC | PRN
  Administered 2018-02-06: 10.89 via INTRAVENOUS

## 2018-02-06 MED ORDER — TECHNETIUM TC 99M TETROFOSMIN IV KIT
30.0000 | PACK | Freq: Once | INTRAVENOUS | Status: AC | PRN
  Administered 2018-02-06: 30.93 via INTRAVENOUS

## 2018-02-07 ENCOUNTER — Telehealth: Payer: Self-pay | Admitting: *Deleted

## 2018-02-07 NOTE — Telephone Encounter (Signed)
Results called to pt. Pt verbalized understanding.  

## 2018-02-07 NOTE — Telephone Encounter (Signed)
Pt is returning your call

## 2018-02-07 NOTE — Telephone Encounter (Signed)
-----   Message from Creig Hines, NP sent at 02/07/2018  9:30 AM EDT ----- Stress test did not show any indication of poor blood flow.  This is good news and he should keep on exercising.

## 2018-02-07 NOTE — Telephone Encounter (Signed)
No answer. Left message to call back.   

## 2018-02-19 ENCOUNTER — Encounter: Payer: Self-pay | Admitting: *Deleted

## 2018-02-19 ENCOUNTER — Emergency Department
Admission: EM | Admit: 2018-02-19 | Discharge: 2018-02-19 | Disposition: A | Discharge status: Home / Self Care | Payer: Medicare Other | Attending: Emergency Medicine | Admitting: Emergency Medicine

## 2018-02-19 ENCOUNTER — Other Ambulatory Visit: Payer: Self-pay

## 2018-02-19 DIAGNOSIS — R21 Rash and other nonspecific skin eruption: Secondary | ICD-10-CM | POA: Diagnosis present

## 2018-02-19 DIAGNOSIS — I5032 Chronic diastolic (congestive) heart failure: Secondary | ICD-10-CM | POA: Diagnosis not present

## 2018-02-19 DIAGNOSIS — Z79899 Other long term (current) drug therapy: Secondary | ICD-10-CM | POA: Diagnosis not present

## 2018-02-19 DIAGNOSIS — L2389 Allergic contact dermatitis due to other agents: Secondary | ICD-10-CM | POA: Insufficient documentation

## 2018-02-19 DIAGNOSIS — Z7982 Long term (current) use of aspirin: Secondary | ICD-10-CM | POA: Insufficient documentation

## 2018-02-19 DIAGNOSIS — I110 Hypertensive heart disease with heart failure: Secondary | ICD-10-CM | POA: Diagnosis not present

## 2018-02-19 DIAGNOSIS — Z87891 Personal history of nicotine dependence: Secondary | ICD-10-CM | POA: Diagnosis not present

## 2018-02-19 MED ORDER — PREDNISONE 50 MG PO TABS: ORAL_TABLET | ORAL | 0 refills | Status: DC

## 2018-02-19 MED ORDER — PREDNISONE 20 MG PO TABS
60.0000 mg | ORAL_TABLET | Freq: Once | ORAL | Status: AC
  Administered 2018-02-19: 60 mg via ORAL
  Filled 2018-02-19: qty 3

## 2018-02-19 NOTE — ED Notes (Signed)
Sunday when he was at his brothers house he was sitting on the couch watching a football game. When he got home around 9pm his back was itching and red, today during the day he was fine and then this evening it started itching again

## 2018-02-19 NOTE — ED Provider Notes (Signed)
[facility] Emergency Department Provider Note  ____________________________________________  Time seen: Approximately 9:42 PM  I have reviewed the triage vital signs and the nursing notes.   HISTORY  Chief Complaint Rash    HPI Joshua Sandoval is a 84 y.o. male presents to the emergency department with hives of the back and left posterior arm.  Patient is unsure of a source for urticaria.  He does report that he has changed soaps recently.  No shortness of breath, chest tightness, chest pain, nausea, vomiting, diarrhea or syncope.  Patient does have an extensive cardiac history and is hesitant to take new medications.  No alleviating measures have been attempted.   Past Medical History:  Diagnosis Date  . Diastolic dysfunction    a. 12/2017 Echo: EF 50-55%, no rwma, Gr1 DD, nl RV fxn.  . Hypertension   . PVC's (premature ventricular contractions)   . RBBB     Patient Active Problem List   Diagnosis Date Noted  . Right bundle branch block 12/23/2017  . HTN (hypertension) 12/18/2017  . Encounter for health maintenance examination with abnormal findings 09/14/2016    Past Surgical History:  Procedure Laterality Date  . NO PAST SURGERIES      Prior to Admission medications   Medication Sig Start Date End Date Taking? Authorizing Provider  amLODipine (NORVASC) 5 MG tablet Take 1 tablet (5 mg total) by mouth daily. 12/18/17   Allegra Grana, FNP  aspirin EC 81 MG tablet Take 81 mg by mouth daily.    [provider]  metoprolol succinate (TOPROL XL) 25 MG 24 hr tablet Take 0.5 tablets (12.5 mg total) by mouth daily. 01/29/18   Creig Hines, NP  predniSONE (DELTASONE) 50 MG tablet Take one 50 mg tablet once daily for the next five days. 02/19/18   Orvil Feil, PA-C    Allergies Other  Family History  Problem Relation Age of Onset  . Hypertension Mother   . Stroke Mother     Social History Social History   Tobacco Use  .  Smoking status: Former Smoker    Types: Cigarettes    Last attempt to quit: 1980    Years since quitting: 39.7  . Smokeless tobacco: Never Used  Substance Use Topics  . Alcohol use: Not Currently  . Drug use: No     Review of Systems  Constitutional: No fever/chills Eyes: No visual changes. No discharge ENT: No upper respiratory complaints. Cardiovascular: no chest pain. Respiratory: no cough. No SOB. Gastrointestinal: No abdominal pain.  No nausea, no vomiting.  No diarrhea.  No constipation. Genitourinary: Negative for dysuria. No hematuria Musculoskeletal: Negative for musculoskeletal pain. Skin: Patient has urticaria of back and left upper arm. Neurological: Negative for headaches, focal weakness or numbness.   ____________________________________________   PHYSICAL EXAM:  VITAL SIGNS: ED Triage Vitals  Enc Vitals Group     BP 02/19/18 2052 (!) 161/87     Pulse Rate 02/19/18 2052 87     Resp 02/19/18 2052 20     Temp 02/19/18 2052 97.8 F (36.6 C)     Temp Source 02/19/18 2052 Oral     SpO2 02/19/18 2052 97 %     Weight 02/19/18 2053 162 lb (73.5 kg)     Height 02/19/18 2053 5\' 11"  (1.803 m)     Head Circumference --      Peak Flow --      Pain Score 02/19/18 2053 0     Pain  Loc --      Pain Edu? --      Excl. in GC? --      Constitutional: Alert and oriented. Well appearing and in no acute distress. Eyes: Conjunctivae are normal. PERRL. EOMI. Head: Atraumatic. ENT:      Ears: TMs are pearly.      Nose: No congestion/rhinnorhea.      Mouth/Throat: Mucous membranes are moist.  Neck: No stridor.  No cervical spine tenderness to palpation. Cardiovascular: Normal rate, regular rhythm. Normal S1 and S2.  Good peripheral circulation. Respiratory: Normal respiratory effort without tachypnea or retractions. Lungs CTAB. Good air entry to the bases with no decreased or absent breath sounds. Musculoskeletal: Full range of motion to all extremities. No gross  deformities appreciated. Neurologic:  Normal speech and language. No gross focal neurologic deficits are appreciated.  Skin: Patient has urticaria of back and left upper arm. Psychiatric: Mood and affect are normal. Speech and behavior are normal. Patient exhibits appropriate insight and judgement.   ____________________________________________   LABS (all labs ordered are listed, but only abnormal results are displayed)  Labs Reviewed - No data to display ____________________________________________  EKG   ____________________________________________  RADIOLOGY  No results found.  ____________________________________________    PROCEDURES  Procedure(s) performed:    Procedures    Medications  predniSONE (DELTASONE) tablet 60 mg (has no administration in time range)     ____________________________________________   INITIAL IMPRESSION / ASSESSMENT AND PLAN / ED COURSE  Pertinent labs & imaging results that were available during my care of the patient were reviewed by me and considered in my medical decision making (see chart for details).  Review of the [place] CSRS was performed in accordance of the [facility] prior to dispensing any controlled drugs.      Assessment and plan Urticaria Patient presents to the emergency department with urticaria of the back and left upper arm.  No shortness of breath, chest tightness, chest pain, nausea, vomiting, diarrhea or syncope that would suggest anaphylaxis.  Patient's wife showed me pictures of urticaria from earlier in the night and hives seem to be improving on their own.  Patient is very hesitant to take multiple medications as he has an extensive cardiac history.  Patient was discharged with prednisone given his first dose of prednisone in the emergency department.  Strict return precautions were given to return with new or worsening symptoms.  Vital signs are reassuring prior to discharge.  All patient questions were  answered.    ____________________________________________  FINAL CLINICAL IMPRESSION(S) / ED DIAGNOSES  Final diagnoses:  Allergic contact dermatitis due to other agents      NEW MEDICATIONS STARTED DURING THIS VISIT:  ED Discharge Orders         Ordered    predniSONE (DELTASONE) 50 MG tablet     02/19/18 2136              This chart was dictated using voice recognition software/Dragon. Despite best efforts to proofread, errors can occur which can change the meaning. Any change was purely unintentional.    Gasper Lloyd 02/19/18 2146    Sharman Cheek, MD 02/19/18 2315

## 2018-02-19 NOTE — ED Triage Notes (Signed)
Pt has red rash on back.  No pain.  Pt has itching.   sx began last night.  Pt alert.

## 2018-02-20 ENCOUNTER — Ambulatory Visit (INDEPENDENT_AMBULATORY_CARE_PROVIDER_SITE_OTHER): Payer: Medicare Other

## 2018-02-20 DIAGNOSIS — I493 Ventricular premature depolarization: Secondary | ICD-10-CM | POA: Diagnosis not present

## 2018-02-22 ENCOUNTER — Ambulatory Visit
Admission: RE | Admit: 2018-02-22 | Discharge: 2018-02-22 | Disposition: A | Discharge status: Home / Self Care | Payer: Medicare Other | Source: Ambulatory Visit | Attending: Nurse Practitioner | Admitting: Nurse Practitioner

## 2018-02-22 DIAGNOSIS — I4510 Unspecified right bundle-branch block: Secondary | ICD-10-CM | POA: Insufficient documentation

## 2018-02-23 ENCOUNTER — Other Ambulatory Visit: Payer: Self-pay

## 2018-02-23 ENCOUNTER — Encounter: Payer: Self-pay | Admitting: Emergency Medicine

## 2018-02-23 ENCOUNTER — Emergency Department
Admission: EM | Admit: 2018-02-23 | Discharge: 2018-02-23 | Disposition: A | Discharge status: Home / Self Care | Payer: Medicare Other | Attending: Emergency Medicine | Admitting: Emergency Medicine

## 2018-02-23 ENCOUNTER — Emergency Department: Payer: Medicare Other

## 2018-02-23 DIAGNOSIS — Z79899 Other long term (current) drug therapy: Secondary | ICD-10-CM | POA: Insufficient documentation

## 2018-02-23 DIAGNOSIS — Y92410 Unspecified street and highway as the place of occurrence of the external cause: Secondary | ICD-10-CM | POA: Insufficient documentation

## 2018-02-23 DIAGNOSIS — Z7982 Long term (current) use of aspirin: Secondary | ICD-10-CM | POA: Diagnosis not present

## 2018-02-23 DIAGNOSIS — Z87891 Personal history of nicotine dependence: Secondary | ICD-10-CM | POA: Insufficient documentation

## 2018-02-23 DIAGNOSIS — Y939 Activity, unspecified: Secondary | ICD-10-CM | POA: Diagnosis not present

## 2018-02-23 DIAGNOSIS — I10 Essential (primary) hypertension: Secondary | ICD-10-CM | POA: Diagnosis not present

## 2018-02-23 DIAGNOSIS — Y999 Unspecified external cause status: Secondary | ICD-10-CM | POA: Diagnosis not present

## 2018-02-23 DIAGNOSIS — S0990XA Unspecified injury of head, initial encounter: Secondary | ICD-10-CM | POA: Insufficient documentation
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
## 2018-02-23 MED ORDER — TRAMADOL HCL 50 MG PO TABS
50.0000 mg | ORAL_TABLET | Freq: Once | ORAL | Status: AC
  Administered 2018-02-23: 50 mg via ORAL
  Filled 2018-02-23: qty 1

## 2018-02-23 MED ORDER — CYCLOBENZAPRINE HCL 10 MG PO TABS
5.0000 mg | ORAL_TABLET | Freq: Once | ORAL | Status: AC
  Administered 2018-02-23: 5 mg via ORAL
  Filled 2018-02-23: qty 1

## 2018-02-23 MED ORDER — TRAMADOL HCL 50 MG PO TABS: 50.0000 mg | ORAL_TABLET | Freq: Two times a day (BID) | ORAL | 0 refills | Status: DC | PRN
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
## 2018-02-23 NOTE — ED Notes (Signed)
Pt alert and oriented X4, active, cooperative, pt in NAD. RR even and unlabored, color WNL.  Pt informed to return if any life threatening symptoms occur.  Discharge and followup instructions reviewed. Ambulates safely. Pt laughing at joking with family at bedside.

## 2018-02-23 NOTE — ED Notes (Signed)
Pt appears in no distress, able to move all extremities, walked to bathroom with no issues.

## 2018-02-26 ENCOUNTER — Encounter: Payer: Self-pay | Admitting: Family Medicine

## 2018-02-26 ENCOUNTER — Ambulatory Visit (INDEPENDENT_AMBULATORY_CARE_PROVIDER_SITE_OTHER): Payer: Medicare Other

## 2018-02-26 ENCOUNTER — Ambulatory Visit: Payer: Medicare Other | Admitting: Family Medicine

## 2018-02-26 DIAGNOSIS — R0781 Pleurodynia: Secondary | ICD-10-CM

## 2018-02-26 DIAGNOSIS — S4991XA Unspecified injury of right shoulder and upper arm, initial encounter: Secondary | ICD-10-CM

## 2018-02-26 DIAGNOSIS — S299XXA Unspecified injury of thorax, initial encounter: Secondary | ICD-10-CM | POA: Diagnosis not present

## 2018-02-26 DIAGNOSIS — M25511 Pain in right shoulder: Secondary | ICD-10-CM | POA: Diagnosis not present
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
## 2018-02-26 MED ORDER — METHYLPREDNISOLONE ACETATE 40 MG/ML IJ SUSP
40.0000 mg | Freq: Once | INTRAMUSCULAR | Status: AC
  Administered 2018-02-26: 40 mg via INTRAMUSCULAR

## 2018-02-26 MED ORDER — KETOROLAC TROMETHAMINE 30 MG/ML IJ SOLN
30.0000 mg | Freq: Once | INTRAMUSCULAR | Status: AC
  Administered 2018-02-26: 30 mg via INTRAMUSCULAR
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
## 2018-02-26 NOTE — Patient Instructions (Signed)
You can take 3000 mg of tylenol total in 24 hour period  You can also use aleve 220 mg 2 times per day WITH FOOD (440 mg total in 24 hour period) for pain for the next 1-2 weeks.

## 2018-03-01 ENCOUNTER — Telehealth: Payer: Self-pay | Admitting: Family

## 2018-03-01 NOTE — Telephone Encounter (Signed)
Pt copy of CD of xrays done in office are placed up front in the folder for pt to pick up.

## 2018-03-01 NOTE — Telephone Encounter (Signed)
Copied from CRM [id number]. Topic: General - Inquiry >> Mar 01, 2018 12:28 PM Lorrine Kin, NT wrote: Patient's daughter calling and state that the patient is seeing an orthopedic doctor tomorrow(03/02/18). States that they need to have a copy of his x-rays/CT/MRI's (any imaging) that was done, so they can take that to the orthopedic doctor. States that the patient's wife will be pick that up when it is ready.  CB#: [phone]

## 2018-03-01 NOTE — Telephone Encounter (Signed)
I called the daughters cell phone twice ,but her voice mail has not been set up. Therefore, I called Mr. Poage to inform him to go by [facility] where he had two of  his images done and come by her to obtain the CD of the xray that was done here.

## 2018-03-02 DIAGNOSIS — R0781 Pleurodynia: Secondary | ICD-10-CM | POA: Insufficient documentation

## 2018-03-02 DIAGNOSIS — S46019A Strain of muscle(s) and tendon(s) of the rotator cuff of unspecified shoulder, initial encounter: Secondary | ICD-10-CM | POA: Insufficient documentation

## 2018-03-02 NOTE — Telephone Encounter (Signed)
Patient calling back to let us know he doesn't need these.

## 2018-03-05 NOTE — Progress Notes (Unsigned)
Closing Encounter.

## 2018-03-07 ENCOUNTER — Ambulatory Visit (INDEPENDENT_AMBULATORY_CARE_PROVIDER_SITE_OTHER): Payer: Medicare Other | Admitting: Internal Medicine

## 2018-03-07 ENCOUNTER — Encounter: Payer: Self-pay | Admitting: Internal Medicine

## 2018-03-07 VITALS — BP 150/80 | HR 75 | Ht 71.0 in | Wt 162.8 lb

## 2018-03-07 DIAGNOSIS — E7800 Pure hypercholesterolemia, unspecified: Secondary | ICD-10-CM

## 2018-03-07 DIAGNOSIS — Z23 Encounter for immunization: Secondary | ICD-10-CM | POA: Diagnosis not present

## 2018-03-07 DIAGNOSIS — I493 Ventricular premature depolarization: Secondary | ICD-10-CM | POA: Diagnosis not present

## 2018-03-07 DIAGNOSIS — I10 Essential (primary) hypertension: Secondary | ICD-10-CM

## 2018-03-07 MED ORDER — METOPROLOL SUCCINATE ER 25 MG PO TB24: 25.0000 mg | ORAL_TABLET | Freq: Every day | ORAL | 2 refills | Status: DC

## 2018-03-07 NOTE — Patient Instructions (Signed)
Medication Instructions:  Your physician has recommended you make the following change in your medication:  1- INCREASE Metoprolol succinate to 25 mg (1 tablet) by mouth once a day.  If you need a refill on your cardiac medications before your next appointment, please call your pharmacy.   Lab work: none If you have labs (blood work) drawn today and your tests are completely normal, you will receive your results only by: Marland Kitchen MyChart Message (if you have MyChart) OR . A paper copy in the mail If you have any lab test that is abnormal or we need to change your treatment, we will call you to review the results.  Testing/Procedures: none  Follow-Up: At [facility], you and your health needs are our priority.  As part of our continuing mission to provide you with exceptional heart care, we have created designated Provider Care Teams.  These Care Teams include your primary Cardiologist (physician) and Advanced Practice Providers (APPs -  Physician Assistants and Nurse Practitioners) who all work together to provide you with the care you need, when you need it. You will need a follow up appointment in 3 months. You may see Yvonne Kendall, MD or one of the following Advanced Practice Providers on your designated Care Team:   Nicolasa Ducking, NP Eula Listen, PA-C . Marisue Ivan, PA-C
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
## 2018-03-13 ENCOUNTER — Other Ambulatory Visit: Payer: Self-pay | Admitting: *Deleted

## 2018-03-13 ENCOUNTER — Telehealth: Payer: Self-pay | Admitting: Internal Medicine

## 2018-03-13 DIAGNOSIS — I10 Essential (primary) hypertension: Secondary | ICD-10-CM

## 2018-03-13 MED ORDER — AMLODIPINE BESYLATE 5 MG PO TABS: 5.0000 mg | ORAL_TABLET | Freq: Every day | ORAL | 2 refills | Status: DC

## 2018-03-13 NOTE — Telephone Encounter (Signed)
°*  STAT* If patient is at the pharmacy, call can be transferred to refill team.   1. Which medications need to be refilled? (please list name of each medication and dose if known) patient came by office - wants a blood pressure medication refilled but medication name is unknown at this time  2. Which pharmacy/location (including street and city if local pharmacy) is medication to be sent to? [facility] on [address] and [address]  3. Do they need a 30 day or 90 day supply? 30 day

## 2018-03-13 NOTE — Telephone Encounter (Signed)
Requested Prescriptions   Signed Prescriptions Disp Refills  . amLODipine (NORVASC) 5 MG tablet 30 tablet 2    Sig: Take 1 tablet (5 mg total) by mouth daily.    Authorizing Provider: END, CHRISTOPHER    Ordering User: Kendrick Fries

## 2018-05-30 ENCOUNTER — Encounter: Payer: Self-pay | Admitting: Internal Medicine

## 2018-05-30 ENCOUNTER — Ambulatory Visit: Payer: Medicare Other | Admitting: Internal Medicine

## 2018-05-30 VITALS — BP 140/62 | HR 66 | Ht 71.0 in | Wt 170.2 lb

## 2018-05-30 DIAGNOSIS — I493 Ventricular premature depolarization: Secondary | ICD-10-CM

## 2018-05-30 DIAGNOSIS — I10 Essential (primary) hypertension: Secondary | ICD-10-CM | POA: Diagnosis not present

## 2018-05-30 NOTE — Patient Instructions (Signed)
Medication Instructions:  Your physician recommends that you continue on your current medications as directed. Please refer to the Current Medication list given to you today.  If you need a refill on your cardiac medications before your next appointment, please call your pharmacy.   Lab work: NONE If you have labs (blood work) drawn today and your tests are completely normal, you will receive your results only by: Marland Kitchen MyChart Message (if you have MyChart) OR . A paper copy in the mail If you have any lab test that is abnormal or we need to change your treatment, we will call you to review the results.  Testing/Procedures: NONE  Follow-Up: At [facility], you and your health needs are our priority.  As part of our continuing mission to provide you with exceptional heart care, we have created designated Provider Care Teams.  These Care Teams include your primary Cardiologist (physician) and Advanced Practice Providers (APPs -  Physician Assistants and Nurse Practitioners) who all work together to provide you with the care you need, when you need it. You will need a follow up appointment in 6 months.  Please call our office 2 months in advance to schedule this appointment.  You may see Yvonne Kendall, MD or one of the following Advanced Practice Providers on your designated Care Team:   Nicolasa Ducking, NP Eula Listen, PA-C . Marisue Ivan, PA-C  Any Other Special Instructions Will Be Listed Below (If Applicable).  Please call us if your blood pressure consistently greater than 140/90 when you check it at home.       DASH Eating Plan DASH stands for "Dietary Approaches to Stop Hypertension." The DASH eating plan is a healthy eating plan that has been shown to reduce high blood pressure (hypertension). It may also reduce your risk for type 2 diabetes, heart disease, and stroke. The DASH eating plan may also help with weight loss. What are tips for following this plan?  General  guidelines  Avoid eating more than 2,300 mg (milligrams) of salt (sodium) a day. If you have hypertension, you may need to reduce your sodium intake to 1,500 mg a day.  Limit alcohol intake to no more than 1 drink a day for nonpregnant women and 2 drinks a day for men. One drink equals 12 oz of beer, 5 oz of wine, or 1 oz of hard liquor.  Work with your health care provider to maintain a healthy body weight or to lose weight. Ask what an ideal weight is for you.  Get at least 30 minutes of exercise that causes your heart to beat faster (aerobic exercise) most days of the week. Activities may include walking, swimming, or biking.  Work with your health care provider or diet and nutrition specialist (dietitian) to adjust your eating plan to your individual calorie needs. Reading food labels   Check food labels for the amount of sodium per serving. Choose foods with less than 5 percent of the Daily Value of sodium. Generally, foods with less than 300 mg of sodium per serving fit into this eating plan.  To find whole grains, look for the word "whole" as the first word in the ingredient list. Shopping  Buy products labeled as "low-sodium" or "no salt added."  Buy fresh foods. Avoid canned foods and premade or frozen meals. Cooking  Avoid adding salt when cooking. Use salt-free seasonings or herbs instead of table salt or sea salt. Check with your health care provider or pharmacist before using salt substitutes.  Do not fry foods. Cook foods using healthy methods such as baking, boiling, grilling, and broiling instead.  Cook with heart-healthy oils, such as olive, canola, soybean, or sunflower oil. Meal planning  Eat a balanced diet that includes: ? 5 or more servings of fruits and vegetables each day. At each meal, try to fill half of your plate with fruits and vegetables. ? Up to 6-8 servings of whole grains each day. ? Less than 6 oz of lean meat, poultry, or fish each day. A 3-oz  serving of meat is about the same size as a deck of cards. One egg equals 1 oz. ? 2 servings of low-fat dairy each day. ? A serving of nuts, seeds, or beans 5 times each week. ? Heart-healthy fats. Healthy fats called Omega-3 fatty acids are found in foods such as flaxseeds and coldwater fish, like sardines, salmon, and mackerel.  Limit how much you eat of the following: ? Canned or prepackaged foods. ? Food that is high in trans fat, such as fried foods. ? Food that is high in saturated fat, such as fatty meat. ? Sweets, desserts, sugary drinks, and other foods with added sugar. ? Full-fat dairy products.  Do not salt foods before eating.  Try to eat at least 2 vegetarian meals each week.  Eat more home-cooked food and less restaurant, buffet, and fast food.  When eating at a restaurant, ask that your food be prepared with less salt or no salt, if possible. What foods are recommended? The items listed may not be a complete list. Talk with your dietitian about what dietary choices are best for you. Grains Whole-grain or whole-wheat bread. Whole-grain or whole-wheat pasta. Brown rice. Orpah Cobbatmeal. Quinoa. Bulgur. Whole-grain and low-sodium cereals. Pita bread. Low-fat, low-sodium crackers. Whole-wheat flour tortillas. Vegetables Fresh or frozen vegetables (raw, steamed, roasted, or grilled). Low-sodium or reduced-sodium tomato and vegetable juice. Low-sodium or reduced-sodium tomato sauce and tomato paste. Low-sodium or reduced-sodium canned vegetables. Fruits All fresh, dried, or frozen fruit. Canned fruit in natural juice (without added sugar). Meat and other protein foods Skinless chicken or Malawiturkey. Ground chicken or Mal[place]urkey. Pork with fat trimmed off. Fish and seafood. Egg whites. Dried beans, peas, or lentils. Unsalted nuts, nut butters, and seeds. Unsalted canned beans. Lean cuts of beef with fat trimmed off. Low-sodium, lean deli meat. Dairy Low-fat (1%) or fat-free (skim) milk.  Fat-free, low-fat, or reduced-fat cheeses. Nonfat, low-sodium ricotta or cottage cheese. Low-fat or nonfat yogurt. Low-fat, low-sodium cheese. Fats and oils Soft margarine without trans fats. Vegetable oil. Low-fat, reduced-fat, or light mayonnaise and salad dressings (reduced-sodium). Canola, safflower, olive, soybean, and sunflower oils. Avocado. Seasoning and other foods Herbs. Spices. Seasoning mixes without salt. Unsalted popcorn and pretzels. Fat-free sweets. What foods are not recommended? The items listed may not be a complete list. Talk with your dietitian about what dietary choices are best for you. Grains Baked goods made with fat, such as croissants, muffins, or some breads. Dry pasta or rice meal packs. Vegetables Creamed or fried vegetables. Vegetables in a cheese sauce. Regular canned vegetables (not low-sodium or reduced-sodium). Regular canned tomato sauce and paste (not low-sodium or reduced-sodium). Regular tomato and vegetable juice (not low-sodium or reduced-sodium). Rosita FirePickles. Olives. Fruits Canned fruit in a light or heavy syrup. Fried fruit. Fruit in cream or butter sauce. Meat and other protein foods Fatty cuts of meat. Ribs. Fried meat. Tomasa BlaseBacon. Sausage. Bologna and other processed lunch meats. Salami. Fatback. Hotdogs. Bratwurst. Salted nuts and seeds. Canned beans with  added salt. Canned or smoked fish. Whole eggs or egg yolks. Chicken or Malaw[place]urkey with skin. Dairy Whole or 2% milk, cream, and half-and-half. Whole or full-fat cream cheese. Whole-fat or sweetened yogurt. Full-fat cheese. Nondairy creamers. Whipped toppings. Processed cheese and cheese spreads. Fats and oils Butter. Stick margarine. Lard. Shortening. Ghee. Bacon fat. Tropical oils, such as coconut, palm kernel, or palm oil. Seasoning and other foods Salted popcorn and pretzels. Onion salt, garlic salt, seasoned salt, table salt, and sea salt. Worcestershire sauce. Tartar sauce. Barbecue sauce. Teriyaki sauce.  Soy sauce, including reduced-sodium. Steak sauce. Canned and packaged gravies. Fish sauce. Oyster sauce. Cocktail sauce. Horseradish that you find on the shelf. Ketchup. Mustard. Meat flavorings and tenderizers. Bouillon cubes. Hot sauce and Tabasco sauce. Premade or packaged marinades. Premade or packaged taco seasonings. Relishes. Regular salad dressings. Where to find more information:  National Heart, Lung, and Blood Institute: PopSteam.iswww.nhlbi.nih.gov  American Heart Association: www.heart.org Summary  The DASH eating plan is a healthy eating plan that has been shown to reduce high blood pressure (hypertension). It may also reduce your risk for type 2 diabetes, heart disease, and stroke.  With the DASH eating plan, you should limit salt (sodium) intake to 2,300 mg a day. If you have hypertension, you may need to reduce your sodium intake to 1,500 mg a day.  When on the DASH eating plan, aim to eat more fresh fruits and vegetables, whole grains, lean proteins, low-fat dairy, and heart-healthy fats.  Work with your health care provider or diet and nutrition specialist (dietitian) to adjust your eating plan to your individual calorie needs. This information is not intended to replace advice given to you by your health care provider. Make sure you discuss any questions you have with your health care provider. Document Released: 04/21/2011 Document Revised: 04/25/2016 Document Reviewed: 04/25/2016 Elsevier Interactive Patient Education  2019 [employer]
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
## 2018-07-30 ENCOUNTER — Other Ambulatory Visit: Payer: Self-pay | Admitting: Internal Medicine

## 2018-07-30 MED ORDER — METOPROLOL SUCCINATE ER 25 MG PO TB24: 25.0000 mg | ORAL_TABLET | Freq: Every day | ORAL | 3 refills | Status: DC

## 2018-07-30 NOTE — Telephone Encounter (Signed)
°*  STAT* If patient is at the pharmacy, call can be transferred to refill team.   1. Which medications need to be refilled? (please list name of each medication and dose if known) metoprolol succinate 25 MG 1/2 tablet daily   2. Which pharmacy/location (including street and city if local pharmacy) is medication to be sent to? [facility] on [address]  3. Do they need a 30 day or 90 day supply? 90 day

## 2018-11-22 DIAGNOSIS — M545 Low back pain, unspecified: Secondary | ICD-10-CM | POA: Insufficient documentation

## 2018-12-11 ENCOUNTER — Telehealth: Payer: Self-pay

## 2018-12-11 NOTE — Telephone Encounter (Signed)
Copied from [place] [id number]. Topic: General - Other >> Dec 11, 2018  9:37 AM Celene Kras A wrote: Reason for CRM: Pt called stating his PT told him at his visit yesterday that there were four shots that he needed to be taking. Please advise.

## 2018-12-14 NOTE — Telephone Encounter (Signed)
I called patient & informed him of below. He is scheduled 8/18 to have Prevnar 13 in office.

## 2018-12-14 NOTE — Telephone Encounter (Signed)
Call pt Yes, he may have shingrex series and tdap at local pharmacy.  You may schedule prevnar 13; one year later he may have pneumococcal 23.   I dont think he needs a rx for shingrex. I have not written those, but certainly if you know otherwise, please send for him   Thank you!

## 2019-01-01 ENCOUNTER — Other Ambulatory Visit: Payer: Self-pay

## 2019-01-01 ENCOUNTER — Ambulatory Visit (INDEPENDENT_AMBULATORY_CARE_PROVIDER_SITE_OTHER): Payer: Medicare Other

## 2019-01-01 DIAGNOSIS — Z23 Encounter for immunization: Secondary | ICD-10-CM | POA: Diagnosis not present

## 2019-01-01 NOTE — Progress Notes (Signed)
Pt presents today for B12 injection. Left deltoid, IM. Pt voiced no concerns nor showed and signs of distress during injection.

## 2019-01-28 ENCOUNTER — Ambulatory Visit: Payer: Medicare Other | Admitting: Internal Medicine

## 2019-01-28 ENCOUNTER — Other Ambulatory Visit: Payer: Self-pay

## 2019-01-28 ENCOUNTER — Encounter: Payer: Self-pay | Admitting: Internal Medicine

## 2019-01-28 VITALS — BP 166/80 | HR 89 | Ht 70.0 in | Wt 166.0 lb

## 2019-01-28 DIAGNOSIS — I10 Essential (primary) hypertension: Secondary | ICD-10-CM

## 2019-01-28 DIAGNOSIS — I493 Ventricular premature depolarization: Secondary | ICD-10-CM | POA: Diagnosis not present

## 2019-01-28 MED ORDER — METOPROLOL SUCCINATE ER 50 MG PO TB24: 50.0000 mg | ORAL_TABLET | Freq: Every day | ORAL | 3 refills | Status: DC

## 2019-01-28 NOTE — Patient Instructions (Signed)
Medication Instructions:  Your physician has recommended you make the following change in your medication:  1- INCREASE Metoprolol to 50 mg by mouth once a day.  If you need a refill on your cardiac medications before your next appointment, please call your pharmacy.   Lab work: - None ordered.  If you have labs (blood work) drawn today and your tests are completely normal, you will receive your results only by: Marland Kitchen MyChart Message (if you have MyChart) OR . A paper copy in the mail If you have any lab test that is abnormal or we need to change your treatment, we will call you to review the results.  Testing/Procedures: - None ordered.   Follow-Up: At [facility], you and your health needs are our priority.  As part of our continuing mission to provide you with exceptional heart care, we have created designated Provider Care Teams.  These Care Teams include your primary Cardiologist (physician) and Advanced Practice Providers (APPs -  Physician Assistants and Nurse Practitioners) who all work together to provide you with the care you need, when you need it. You will need a follow up appointment in 3 months.  Please call our office 2 months in advance to schedule this appointment.  You may see Nelva Bush, MD or one of the following Advanced Practice Providers on your designated Care Team:   Murray Hodgkins, NP Christell Faith, PA-C . Marrianne Mood, PA-C   Your physician has requested that you regularly monitor and record your blood pressure readings at home. Please use the same machine at the same time of day to check your readings and let us know if it is running consistently greater than 140/90.
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
## 2019-01-29 ENCOUNTER — Encounter: Payer: Self-pay | Admitting: Internal Medicine

## 2019-02-26 ENCOUNTER — Ambulatory Visit (INDEPENDENT_AMBULATORY_CARE_PROVIDER_SITE_OTHER): Payer: Medicare Other

## 2019-02-26 ENCOUNTER — Other Ambulatory Visit: Payer: Self-pay

## 2019-02-26 DIAGNOSIS — Z0000 Encounter for general adult medical examination without abnormal findings: Secondary | ICD-10-CM | POA: Diagnosis not present

## 2019-02-26 NOTE — Progress Notes (Signed)
Subjective:   Joshua Sandoval is a [age] y.o. male who presents for an Initial Medicare Annual Wellness Visit.  Review of Systems  No ROS.  Medicare Wellness Virtual Visit.  Visual/audio telehealth visit, UTA vital signs.   See social history for additional risk factors.   Cardiac Risk Factors include: advanced age (>3755men, 64>65 women);male gender;hypertension    Objective:    Today's Vitals   There is no height or weight on file to calculate BMI.  Advanced Directives 02/26/2019 02/23/2018  Does Patient Have a Medical Advance Directive? No No  Would patient like information on creating a medical advance directive? No - Patient declined -    Current Medications (verified) Outpatient Encounter Medications as of 02/26/2019  Medication Sig  . amLODipine (NORVASC) 5 MG tablet Take 1 tablet (5 mg total) by mouth daily.  Marland Kitchen. aspirin EC 81 MG tablet Take 81 mg by mouth daily.  . metoprolol succinate (TOPROL-XL) 50 MG 24 hr tablet Take 1 tablet (50 mg total) by mouth daily. Take with or immediately following a meal.  . traMADol (ULTRAM) 50 MG tablet Take 1 tablet (50 mg total) by mouth every 12 (twelve) hours as needed.   No facility-administered encounter medications on file as of 02/26/2019.     Allergies (verified) Other   History: Past Medical History:  Diagnosis Date  . Diastolic dysfunction    a. 12/2017 Echo: EF 50-55%, no rwma, Gr1 DD, nl RV fxn.  . Hypertension   . PVC's (premature ventricular contractions)   . RBBB    Past Surgical History:  Procedure Laterality Date  . NO PAST SURGERIES     Family History  Problem Relation Age of Onset  . Hypertension Mother   . Stroke Mother    Social History   Socioeconomic History  . Marital status: Married    Spouse name: Not on file  . Number of children: Not on file  . Years of education: Not on file  . Highest education level: Not on file  Occupational History  . Not on file  Social Needs  . Financial resource strain:  Not hard at all  . Food insecurity    Worry: Never true    Inability: Never true  . Transportation needs    Medical: No    Non-medical: No  Tobacco Use  . Smoking status: Former Smoker    Types: Cigarettes    Quit date: 1980    Years since quitting: 40.8  . Smokeless tobacco: Never Used  Substance and Sexual Activity  . Alcohol use: Not Currently  . Drug use: No  . Sexual activity: Yes    Partners: Female  Lifestyle  . Physical activity    Days per week: 1 day    Minutes per session: 60 min  . Stress: Not at all  Relationships  . Social Musicianconnections    Talks on phone: Not on file    Gets together: Not on file    Attends religious service: Not on file    Active member of club or organization: Not on file    Attends meetings of clubs or organizations: Not on file    Relationship status: Not on file  Other Topics Concern  . Not on file  Social History Narrative  . Not on file   Tobacco Counseling Counseling given: Not Answered   Clinical Intake:  Pre-visit preparation completed: Yes        Diabetes: No  How often do you need to have  someone help you when you read instructions, pamphlets, or other written materials from your doctor or pharmacy?: 1 - Never  Interpreter Needed?: No     Activities of Daily Living In your present state of health, do you have any difficulty performing the following activities: 02/26/2019  Hearing? N  Vision? N  Difficulty concentrating or making decisions? N  Walking or climbing stairs? N  Dressing or bathing? N  Doing errands, shopping? N  Preparing Food and eating ? N  Using the Toilet? N  In the past six months, have you accidently leaked urine? N  Do you have problems with loss of bowel control? N  Managing your Medications? N  Managing your Finances? N  Housekeeping or managing your Housekeeping? N  Some recent data might be hidden     Immunizations and Health Maintenance Immunization History  Administered  Date(s) Administered  . Influenza,inj,Quad PF,6+ Mos 03/07/2018  . Pneumococcal Conjugate-13 01/01/2019   Health Maintenance Due  Topic Date Due  . TETANUS/TDAP  10/21/1951  . INFLUENZA VACCINE  12/15/2018    Patient Care Team: Allegra Grana, FNP as PCP - General (Family Medicine) End, Cristal Deer, MD as PCP - Cardiology (Cardiology)  Indicate any recent Medical Services you may have received from other than [facility] providers in the past year (date may be approximate).    Assessment:   This is a routine wellness examination for Joshua Sandoval.  I connected with patient 02/26/19 at  1:00 PM EDT by an audio enabled telemedicine application and verified that I am speaking with the correct person using two identifiers. Patient stated full name and DOB. Patient gave permission to continue with virtual visit. Patient's location was at home and Nurse's location was at [place] office.   Health Maintenance Due: -Influenza vaccine 2020- discussed; to be completed in season with doctor or local pharmacy.   Update all pending maintenance due as appropriate.   See completed HM at the end of note.   Eye: Visual acuity not assessed. Virtual visit. Wears corrective lenses. Followed by their ophthalmologist.  Dental: Dentures- yes  Hearing: Demonstrates normal hearing during visit.  Safety:  Patient feels safe at home- yes Patient does have smoke detectors at home- yes Patient does wear sunscreen or protective clothing when in direct sunlight - yes Patient does wear seat belt when in a moving vehicle - yes Patient drives- yes Adequate lighting in walkways free from debris- yes Grab bars and handrails used as appropriate- yes Ambulates with no assistive device Cell phone on person when ambulating outside of the home- yes  Social: Alcohol intake - no   Smoking history- former    Smokers in home? none Illicit drug use? none  Depression: PHQ 2 &9 complete. See screening below. Denies  irritability, anhedonia, sadness/tearfullness.    Falls: See screening below.    Medication: Taking as directed and without issues.   Covid-19: Precautions and sickness symptoms discussed. Wears mask, social distancing, hand hygiene as appropriate.   Activities of Daily Living Patient denies needing assistance with: household chores, feeding themselves, getting from bed to chair, getting to the toilet, bathing/showering, dressing, managing money, or preparing meals.   Memory: Patient is alert. Patient denies difficulty focusing or concentrating. Correctly identified the president of the [place], season and recall.  BMI- discussed the importance of a healthy diet, water intake and the benefits of aerobic exercise.  Educational material provided.  Physical activity- physical therapy once a week, 60 minutes.  Diet: regular Water: good intake  Caffeine: none  Other Providers Patient Care Team: Burnard Hawthorne, FNP as PCP - General (Family Medicine) End, Harrell Gave, MD as PCP - Cardiology (Cardiology)  Hearing/Vision screen  Hearing Screening   125Hz  250Hz  500Hz  1000Hz  2000Hz  3000Hz  4000Hz  6000Hz  8000Hz   Right ear:           Left ear:           Comments: Patient is able to hear conversational tones without difficulty.  No issues reported.  Vision Screening Comments: Visual acuity not assessed, virtual visit.       Dietary issues and exercise activities discussed: Current Exercise Habits: Structured exercise class, Intensity: Moderate  Goals      Patient Stated   . Increase physical activity (pt-stated)     I want to start lifting weights again       Depression Screen PHQ 2/9 Scores 02/26/2019 09/14/2016  PHQ - 2 Score 0 0  PHQ- 9 Score - 0    Fall Risk Fall Risk  02/26/2019 09/14/2016  Falls in the past year? 0 No   Timed Get Up and Go performed: no, virtual visit  Cognitive Function:     6CIT Screen 02/26/2019  What Year? 0 points  What month? 0 points   What time? 0 points  Count back from 20 0 points  Months in reverse 0 points  Repeat phrase 2 points  Total Score 2    Screening Tests Health Maintenance  Topic Date Due  . TETANUS/TDAP  10/21/1951  . INFLUENZA VACCINE  12/15/2018  . PNA vac Low Risk Adult (2 of 2 - PPSV23) 01/01/2020       Plan:   Keep all routine maintenance appointments.   Next nurse visit. 02/27/19. Flu shot.  Medicare Attestation I have personally reviewed: The patient's medical and social history Their use of alcohol, tobacco or illicit drugs Their current medications and supplements The patient's functional ability including ADLs,fall risks, home safety risks, cognitive, and hearing and visual impairment Diet and physical activities Evidence for depression   In addition, I have reviewed and discussed with patient certain preventive protocols, quality metrics, and best practice recommendations. A written personalized care plan for preventive services as well as general preventive health recommendations were provided to patient via mail.     Varney Biles, LPN   [id number]

## 2019-02-26 NOTE — Patient Instructions (Addendum)
  Mr. Hargadon , Thank you for taking time to come for your Medicare Wellness Visit. I appreciate your ongoing commitment to your health goals. Please review the following plan we discussed and let me know if I can assist you in the future.   These are the goals we discussed: Goals      Patient Stated   . Increase physical activity (pt-stated)     I want to start lifting weights again        This is a list of the screening recommended for you and due dates:  Health Maintenance  Topic Date Due  . Tetanus Vaccine  10/21/1951  . Flu Shot  12/15/2018  . Pneumonia vaccines (2 of 2 - PPSV23) 01/01/2020

## 2019-02-27 ENCOUNTER — Other Ambulatory Visit: Payer: Self-pay

## 2019-02-27 ENCOUNTER — Ambulatory Visit (INDEPENDENT_AMBULATORY_CARE_PROVIDER_SITE_OTHER): Payer: Medicare Other

## 2019-02-27 DIAGNOSIS — Z23 Encounter for immunization: Secondary | ICD-10-CM

## 2019-03-05 ENCOUNTER — Other Ambulatory Visit: Payer: Self-pay | Admitting: Internal Medicine

## 2019-03-05 DIAGNOSIS — I10 Essential (primary) hypertension: Secondary | ICD-10-CM

## 2019-03-05 MED ORDER — AMLODIPINE BESYLATE 5 MG PO TABS: 5.0000 mg | ORAL_TABLET | Freq: Every day | ORAL | 2 refills | Status: DC

## 2019-03-05 NOTE — Telephone Encounter (Signed)
Requested Prescriptions   Signed Prescriptions Disp Refills  . amLODipine (NORVASC) 5 MG tablet 30 tablet 2    Sig: Take 1 tablet (5 mg total) by mouth daily.    Authorizing Provider: END, CHRISTOPHER    Ordering User: Raelene Bott, BRANDY L

## 2019-03-05 NOTE — Telephone Encounter (Signed)
°*  STAT* If patient is at the pharmacy, call can be transferred to refill team.   1. Which medications need to be refilled? (please list name of each medication and dose if known) amlodipine 5 MG 1 tablet daily    2. Which pharmacy/location (including street and city if local pharmacy) is medication to be sent to? [facility] on [address]   3. Do they need a 30 day or 90 day supply? 30 day

## 2019-04-15 ENCOUNTER — Other Ambulatory Visit: Payer: Self-pay

## 2019-04-15 DIAGNOSIS — Z20822 Contact with and (suspected) exposure to covid-19: Secondary | ICD-10-CM

## 2019-04-18 LAB — NOVEL CORONAVIRUS, NAA: SARS-CoV-2, NAA: NOT DETECTED

## 2019-05-01 ENCOUNTER — Encounter: Payer: Self-pay | Admitting: Internal Medicine

## 2019-05-01 ENCOUNTER — Other Ambulatory Visit: Payer: Self-pay

## 2019-05-01 ENCOUNTER — Ambulatory Visit (INDEPENDENT_AMBULATORY_CARE_PROVIDER_SITE_OTHER): Payer: Medicare Other | Admitting: Internal Medicine

## 2019-05-01 VITALS — BP 140/78 | HR 84 | Ht 70.5 in | Wt 165.2 lb

## 2019-05-01 DIAGNOSIS — I10 Essential (primary) hypertension: Secondary | ICD-10-CM

## 2019-05-01 DIAGNOSIS — I493 Ventricular premature depolarization: Secondary | ICD-10-CM | POA: Diagnosis not present

## 2019-05-01 DIAGNOSIS — R200 Anesthesia of skin: Secondary | ICD-10-CM | POA: Diagnosis not present

## 2019-05-01 MED ORDER — AMLODIPINE BESYLATE 10 MG PO TABS: 10.0000 mg | ORAL_TABLET | Freq: Every day | ORAL | 3 refills | Status: DC

## 2019-05-01 NOTE — Patient Instructions (Signed)
Medication Instructions:  Your physician has recommended you make the following change in your medication:  1- INCREASE Amlodipine to 10 mg by mouth once a day.  *If you need a refill on your cardiac medications before your next appointment, please call your pharmacy*  Lab Work: none If you have labs (blood work) drawn today and your tests are completely normal, you will receive your results only by: Marland Kitchen MyChart Message (if you have MyChart) OR . A paper copy in the mail If you have any lab test that is abnormal or we need to change your treatment, we will call you to review the results.  Testing/Procedures: none  Follow-Up: At [facility], you and your health needs are our priority.  As part of our continuing mission to provide you with exceptional heart care, we have created designated Provider Care Teams.  These Care Teams include your primary Cardiologist (physician) and Advanced Practice Providers (APPs -  Physician Assistants and Nurse Practitioners) who all work together to provide you with the care you need, when you need it.  Your next appointment:   3 month(s)  The format for your next appointment:   In Person  Provider:    You may see Nelva Bush, MD or one of the following Advanced Practice Providers on your designated Care Team:    Murray Hodgkins, NP  Christell Faith, PA-C  Marrianne Mood, PA-C
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
## 2019-05-03 ENCOUNTER — Encounter: Payer: Self-pay | Admitting: Internal Medicine

## 2019-05-03 DIAGNOSIS — R200 Anesthesia of skin: Secondary | ICD-10-CM | POA: Insufficient documentation

## 2019-05-29 ENCOUNTER — Other Ambulatory Visit: Payer: Self-pay

## 2019-05-29 ENCOUNTER — Telehealth: Payer: Self-pay | Admitting: Internal Medicine

## 2019-05-29 MED ORDER — METOPROLOL SUCCINATE ER 50 MG PO TB24: 50.0000 mg | ORAL_TABLET | Freq: Every day | ORAL | 0 refills | Status: DC

## 2019-05-29 NOTE — Telephone Encounter (Signed)
°*  STAT* If patient is at the pharmacy, call can be transferred to refill team.   1. Which medications need to be refilled? (please list name of each medication and dose if known) metoprolol 50 mg daily  2. Which pharmacy/location (including street and city if local pharmacy) is medication to be sent to? [facility] on [address]  3. Do they need a 30 day or 90 day supply? 90

## 2019-05-29 NOTE — Telephone Encounter (Signed)
Requested Prescriptions   Signed Prescriptions Disp Refills  . metoprolol succinate (TOPROL-XL) 50 MG 24 hr tablet 30 tablet 0    Sig: Take 1 tablet (50 mg total) by mouth daily. Take with or immediately following a meal.    Authorizing Provider: END, CHRISTOPHER    Ordering User: Ethelle Lyon

## 2019-05-29 NOTE — Telephone Encounter (Signed)
Requested Prescriptions   Signed Prescriptions Disp Refills  . metoprolol succinate (TOPROL-XL) 50 MG 24 hr tablet 30 tablet 0    Sig: Take 1 tablet (50 mg total) by mouth daily. Take with or immediately following a meal.    Authorizing Provider: END, CHRISTOPHER    Ordering User: Kolbie Lepkowski M    

## 2019-06-24 ENCOUNTER — Other Ambulatory Visit: Payer: Self-pay

## 2019-06-24 ENCOUNTER — Ambulatory Visit: Payer: Medicare PPO | Attending: Internal Medicine

## 2019-06-24 ENCOUNTER — Ambulatory Visit: Payer: Medicare Other | Admitting: Podiatry

## 2019-06-24 DIAGNOSIS — Z23 Encounter for immunization: Secondary | ICD-10-CM

## 2019-06-24 NOTE — Progress Notes (Signed)
   Covid-19 Vaccination Clinic  Name:  Donne Baley    MRN: [id number] DOB: April 06, 1933  06/24/2019  Mr. Gallego was observed post Covid-19 immunization for 15 minutes without incidence. He was provided with Vaccine Information Sheet and instruction to access the V-Safe system.   Mr. Kaeser was instructed to call 911 with any severe reactions post vaccine: Marland Kitchen Difficulty breathing  . Swelling of your face and throat  . A fast heartbeat  . A bad rash all over your body  . Dizziness and weakness    Immunizations Administered    Name Date Dose VIS Date Route   Moderna COVID-19 Vaccine 06/24/2019  2:05 PM 0.5 mL 04/16/2019 Intramuscular   Manufacturer: Moderna   Lot: [id number]   NDC: 58948-347-58

## 2019-06-28 ENCOUNTER — Encounter: Payer: Self-pay | Admitting: Podiatry

## 2019-06-28 ENCOUNTER — Other Ambulatory Visit: Payer: Self-pay

## 2019-06-28 ENCOUNTER — Ambulatory Visit: Payer: Medicare Other | Admitting: Podiatry

## 2019-06-28 DIAGNOSIS — B351 Tinea unguium: Secondary | ICD-10-CM | POA: Diagnosis not present

## 2019-06-28 DIAGNOSIS — M79675 Pain in left toe(s): Secondary | ICD-10-CM | POA: Diagnosis not present

## 2019-06-28 DIAGNOSIS — M79674 Pain in right toe(s): Secondary | ICD-10-CM | POA: Diagnosis not present

## 2019-07-02 NOTE — Progress Notes (Signed)
   SUBJECTIVE Patient presents to office today complaining of elongated, thickened nails that cause pain while ambulating in shoes. He is unable to trim his own nails. Patient is here for further evaluation and treatment.  Past Medical History:  Diagnosis Date  . Diastolic dysfunction    a. 12/2017 Echo: EF 50-55%, no rwma, Gr1 DD, nl RV fxn.  . Hypertension   . PVC's (premature ventricular contractions)   . RBBB     OBJECTIVE General Patient is awake, alert, and oriented x 3 and in no acute distress. Derm Skin is dry and supple bilateral. Negative open lesions or macerations. Remaining integument unremarkable. Nails are tender, long, thickened and dystrophic with subungual debris, consistent with onychomycosis, 1-5 bilateral. No signs of infection noted. Vasc  DP and PT pedal pulses palpable bilaterally. Temperature gradient within normal limits.  Neuro Epicritic and protective threshold sensation grossly intact bilaterally.  Musculoskeletal Exam No symptomatic pedal deformities noted bilateral. Muscular strength within normal limits.  ASSESSMENT 1. Onychodystrophic nails 1-5 bilateral with hyperkeratosis of nails.  2. Onychomycosis of nail due to dermatophyte bilateral 3. Pain in foot bilateral  PLAN OF CARE 1. Patient evaluated today.  2. Instructed to maintain good pedal hygiene and foot care.  3. Mechanical debridement of nails 1-5 bilaterally performed using a nail nipper. Filed with dremel without incident.  4. Recommended shoes from [employer] in [place].  5. Return to clinic in 3 mos.    Felecia Shelling, DPM [facility]  Dr. Felecia Shelling, DPM    [address]                                        [place], [place] [address]                Office [phone]  Fax [phone]

## 2019-07-10 ENCOUNTER — Other Ambulatory Visit: Payer: Self-pay | Admitting: Internal Medicine

## 2019-07-10 MED ORDER — METOPROLOL SUCCINATE ER 50 MG PO TB24: 50.0000 mg | ORAL_TABLET | Freq: Every day | ORAL | 0 refills | Status: DC

## 2019-07-10 NOTE — Telephone Encounter (Signed)
°*  STAT* If patient is at the pharmacy, call can be transferred to refill team.   1. Which medications need to be refilled? (please list name of each medication and dose if known) metoprolol 50 mg  2. Which pharmacy/location (including street and city if local pharmacy) is medication to be sent to? [facility] (near [employer])  3. Do they need a 30 day or 90 day supply? 30

## 2019-07-10 NOTE — Telephone Encounter (Signed)
Requested Prescriptions   Signed Prescriptions Disp Refills   metoprolol succinate (TOPROL-XL) 50 MG 24 hr tablet 30 tablet 0    Sig: Take 1 tablet (50 mg total) by mouth daily. Take with or immediately following a meal.    Authorizing Provider: END, CHRISTOPHER    Ordering User: NEWCOMER MCCLAIN, Elizbeth Posa L

## 2019-07-11 ENCOUNTER — Other Ambulatory Visit: Payer: Self-pay | Admitting: *Deleted

## 2019-07-11 MED ORDER — METOPROLOL SUCCINATE ER 50 MG PO TB24: 50.0000 mg | ORAL_TABLET | Freq: Every day | ORAL | 0 refills | Status: DC

## 2019-07-11 NOTE — Telephone Encounter (Signed)
Requested Prescriptions   Signed Prescriptions Disp Refills  . metoprolol succinate (TOPROL-XL) 50 MG 24 hr tablet 90 tablet 0    Sig: Take 1 tablet (50 mg total) by mouth daily. Take with or immediately following a meal.    Authorizing Provider: END, CHRISTOPHER    Ordering User: Kendrick Fries

## 2019-07-24 ENCOUNTER — Ambulatory Visit: Payer: Medicare PPO | Attending: Internal Medicine

## 2019-07-24 DIAGNOSIS — Z23 Encounter for immunization: Secondary | ICD-10-CM | POA: Insufficient documentation

## 2019-07-24 NOTE — Progress Notes (Signed)
   Covid-19 Vaccination Clinic  Name:  Joshua Sandoval    MRN: [id number] DOB: February 08, 1933  07/24/2019  [name] was observed post Covid-19 immunization for 15 minutes without incident. He was provided with Vaccine Information Sheet and instruction to access the V-Safe system.   [name] was instructed to call 911 with any severe reactions post vaccine: Marland Kitchen Difficulty breathing  . Swelling of face and throat  . A fast heartbeat  . A bad rash all over body  . Dizziness and weakness   Immunizations Administered    Name Date Dose VIS Date Route   Moderna COVID-19 Vaccine 07/24/2019 12:02 PM 0.5 mL 04/16/2019 Intramuscular   Manufacturer: Moderna   Lot: [id number]   NDC: 83358-251-89

## 2019-08-07 ENCOUNTER — Ambulatory Visit: Payer: Medicare Other | Admitting: Internal Medicine

## 2019-08-07 NOTE — Progress Notes (Deleted)
   Follow-up Outpatient Visit Date: 08/07/2019  Primary Care Provider: Allegra Grana, FNP [address] [place] [address]  Chief Complaint: ***  HPI:  Joshua Sandoval is a 86 y.o. male with history of hypertension, frequent PVC's,and RBBB, who presents for follow-up of hypertension and PVCs.  I last saw him in 04/2019, at which time he was feeling well with resolution of right-sided chest wall pain after participating in physical therapy.  Blood pressure was suboptimally controlled, prompting [place] to increase amlodipine to 10 mg daily.  --------------------------------------------------------------------------------------------------  Cardiovascular History & Procedures: Cardiovascular Problems:  Hypertension  Frequent PVC's  RBBB  Risk Factors:  Hypertension, male gender, and age greater than 47  Cath/PCI:  None  CV Surgery:  None  EP Procedures and Devices:  Holter monitor (02/21/18): Predominantly sinus rhythm with frequent PVC's (15% burden), rare PAC's, and brief atrial runs.  Non-Invasive Evaluation(s):  Echo (01/01/18): Normal LV size with LVEF 50-55% and grade 1 diastolic dysfunction. Normal RV size and function. Normal PA pressure. Frequent PVC's noted during study.  Exercise MPI (02/06/18): Low risk study without ischemia or scar. Normal LVEF. Poor exercise capacity.  Recent CV Pertinent Labs: Lab Results  Component Value Date   CHOL 224 (H) 01/29/2018   HDL 59 01/29/2018   LDLCALC 146 (H) 01/29/2018   LDLDIRECT 150 (H) 01/29/2018   TRIG 93 01/29/2018   CHOLHDL 3.8 01/29/2018   K 4.1 01/29/2018   MG 2.0 01/29/2018   BUN 19 01/29/2018   CREATININE 1.01 01/29/2018    Past medical and surgical history were reviewed and updated in EPIC.  No outpatient medications have been marked as taking for the 08/07/19 encounter (Appointment) with Yulian Gosney, Cristal Deer, MD.    Allergies: Other  Social History   Tobacco Use  . Smoking status:  Former Smoker    Types: Cigarettes    Quit date: 1980    Years since quitting: 41.2  . Smokeless tobacco: Never Used  Substance Use Topics  . Alcohol use: Not Currently  . Drug use: No    Family History  Problem Relation Age of Onset  . Hypertension Mother   . Stroke Mother     Review of Systems: A 12-system review of systems was performed and was negative except as noted in the HPI.  --------------------------------------------------------------------------------------------------  Physical Exam: There were no vitals taken for this visit.  General:  *** HEENT: No conjunctival pallor or scleral icterus. Facemask in place. Neck: Supple without lymphadenopathy, thyromegaly, JVD, or HJR. Lungs: Normal work of breathing. Clear to auscultation bilaterally without wheezes or crackles. Heart: Regular rate and rhythm without murmurs, rubs, or gallops. Non-displaced PMI. Abd: Bowel sounds present. Soft, NT/ND without hepatosplenomegaly Ext: No lower extremity edema. Radial, PT, and DP pulses are 2+ bilaterally. Skin: Warm and dry without rash.  EKG:  ***  No results found for: WBC, HGB, HCT, MCV, PLT  Lab Results  Component Value Date   NA 139 01/29/2018   K 4.1 01/29/2018   CL 100 01/29/2018   CO2 23 01/29/2018   BUN 19 01/29/2018   CREATININE 1.01 01/29/2018   GLUCOSE 145 (H) 01/29/2018    Lab Results  Component Value Date   CHOL 224 (H) 01/29/2018   HDL 59 01/29/2018   LDLCALC 146 (H) 01/29/2018   LDLDIRECT 150 (H) 01/29/2018   TRIG 93 01/29/2018   CHOLHDL 3.8 01/29/2018    --------------------------------------------------------------------------------------------------  ASSESSMENT AND PLAN: Cristal Deer Ailish Prospero, MD 08/07/2019 1:15 PM

## 2019-08-08 ENCOUNTER — Encounter: Payer: Self-pay | Admitting: Internal Medicine

## 2019-08-14 ENCOUNTER — Other Ambulatory Visit: Payer: Self-pay | Admitting: Internal Medicine

## 2019-08-14 ENCOUNTER — Other Ambulatory Visit: Payer: Self-pay | Admitting: Cardiovascular Disease

## 2019-08-14 MED ORDER — METOPROLOL SUCCINATE ER 50 MG PO TB24: 50.0000 mg | ORAL_TABLET | Freq: Every day | ORAL | 0 refills | Status: DC

## 2019-08-14 NOTE — Telephone Encounter (Signed)
*  STAT* If patient is at the pharmacy, call can be transferred to refill team.   1. Which medications need to be refilled? (please list name of each medication and dose if known) metoprolol 50 mg  2. Which pharmacy/location (including street and city if local pharmacy) is medication to be sent to? [facility] [address]  3. Do they need a 30 day or 90 day supply? 30

## 2019-08-14 NOTE — Telephone Encounter (Signed)
Requested Prescriptions   Signed Prescriptions Disp Refills  . metoprolol succinate (TOPROL-XL) 50 MG 24 hr tablet 30 tablet 0    Sig: Take 1 tablet (50 mg total) by mouth daily. Take with or immediately following a meal.    Authorizing Provider: Antonieta Iba    Ordering User: Kendrick Fries

## 2019-08-23 ENCOUNTER — Ambulatory Visit: Payer: Medicare PPO | Admitting: Nurse Practitioner

## 2019-08-28 ENCOUNTER — Encounter: Payer: Self-pay | Admitting: Physician Assistant

## 2019-08-28 ENCOUNTER — Ambulatory Visit (INDEPENDENT_AMBULATORY_CARE_PROVIDER_SITE_OTHER): Payer: Medicare PPO | Admitting: Physician Assistant

## 2019-08-28 ENCOUNTER — Other Ambulatory Visit: Payer: Self-pay

## 2019-08-28 VITALS — BP 140/70 | HR 53 | Ht 68.0 in | Wt 163.5 lb

## 2019-08-28 DIAGNOSIS — I5189 Other ill-defined heart diseases: Secondary | ICD-10-CM

## 2019-08-28 DIAGNOSIS — I4510 Unspecified right bundle-branch block: Secondary | ICD-10-CM

## 2019-08-28 DIAGNOSIS — I10 Essential (primary) hypertension: Secondary | ICD-10-CM

## 2019-08-28 DIAGNOSIS — I493 Ventricular premature depolarization: Secondary | ICD-10-CM | POA: Diagnosis not present

## 2019-08-28 MED ORDER — AMLODIPINE BESYLATE 10 MG PO TABS: 10.0000 mg | ORAL_TABLET | Freq: Every day | ORAL | 11 refills | Status: DC

## 2019-08-28 MED ORDER — METOPROLOL SUCCINATE ER 50 MG PO TB24: ORAL_TABLET | ORAL | 11 refills | Status: DC
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
## 2019-08-28 NOTE — Patient Instructions (Signed)
Medication Instructions:  Your physician recommends that you continue on your current medications as directed. Please refer to the Current Medication list given to you today.  *If you need a refill on your cardiac medications before your next appointment, please call your pharmacy*   Lab Work: None ordered  If you have labs (blood work) drawn today and your tests are completely normal, you will receive your results only by: Marland Kitchen MyChart Message (if you have MyChart) OR . A paper copy in the mail If you have any lab test that is abnormal or we need to change your treatment, we will call you to review the results.   Testing/Procedures: None ordered    Follow-Up: At [facility], you and your health needs are our priority.  As part of our continuing mission to provide you with exceptional heart care, we have created designated Provider Care Teams.  These Care Teams include your primary Cardiologist (physician) and Advanced Practice Providers (APPs -  Physician Assistants and Nurse Practitioners) who all work together to provide you with the care you need, when you need it.  We recommend signing up for the patient portal called "MyChart".  Sign up information is provided on this After Visit Summary.  MyChart is used to connect with patients for Virtual Visits (Telemedicine).  Patients are able to view lab/test results, encounter notes, upcoming appointments, etc.  Non-urgent messages can be sent to your provider as well.   To learn more about what you can do with MyChart, go to [email or web address].    Your next appointment:   6 month(s)  The format for your next appointment:   In Person  Provider:     You may see Yvonne Kendall, MD or Marisue Ivan, PA-C.    Other Instructions Please bring BP logs to each cardiology appt.  How to use a home blood pressure monitor. . Be still. Don't smoke, drink caffeinated beverages or exercise within 30 minutes before measuring your blood  pressure. . Sit correctly. Sit with your back straight and supported (on a dining chair, rather than a sofa). Your feet should be flat on the floor and your legs should not be crossed. Your arm should be supported on a flat surface (such as a table) with the upper arm at heart level. Make sure the bottom of the cuff is placed directly above the bend of the elbow.  . Measure at the same time every day. It's important to take the readings at the same time each day, such as morning and evening. Take reading approximately 1 hour after BP medications.  Readings consistently over 140/90 should be addressed. Please call the office for further assistance.

## 2019-09-30 ENCOUNTER — Ambulatory Visit: Payer: Medicare PPO | Admitting: Podiatry

## 2019-11-19 ENCOUNTER — Telehealth: Payer: Self-pay | Admitting: Internal Medicine

## 2019-11-19 ENCOUNTER — Other Ambulatory Visit: Payer: Self-pay | Admitting: *Deleted

## 2019-11-19 MED ORDER — METOPROLOL SUCCINATE ER 50 MG PO TB24: ORAL_TABLET | ORAL | 3 refills | Status: DC

## 2019-11-19 MED ORDER — AMLODIPINE BESYLATE 10 MG PO TABS: 10.0000 mg | ORAL_TABLET | Freq: Every day | ORAL | 3 refills | Status: DC

## 2019-11-19 NOTE — Telephone Encounter (Signed)
Requested Prescriptions   Signed Prescriptions Disp Refills   amLODipine (NORVASC) 10 MG tablet 30 tablet 3    Sig: Take 1 tablet (10 mg total) by mouth daily.    Authorizing Provider: Marisue Ivan D    Ordering User: Iverson Alamin C   metoprolol succinate (TOPROL-XL) 50 MG 24 hr tablet 30 tablet 3    Sig: TAKE 1 TABLET(50 MG) BY MOUTH DAILY WITH OR IMMEDIATELY FOLLOWING A MEAL    Authorizing Provider: Marisue Ivan D    Ordering User: Kendrick Fries

## 2019-11-19 NOTE — Telephone Encounter (Signed)
amLODipine (NORVASC) 10 MG tablet 30 tablet 3 11/19/2019 02/17/2020   Sig - Route: Take 1 tablet (10 mg total) by mouth daily. - Oral   Sent to pharmacy as: amLODipine (NORVASC) 10 MG tablet   E-Prescribing Status: Receipt confirmed by pharmacy (11/19/2019  4:00 PM EDT)

## 2019-11-19 NOTE — Telephone Encounter (Signed)
*  STAT* If patient is at the pharmacy, call can be transferred to refill team.   1. Which medications need to be refilled? (please list name of each medication and dose if known) amlodipine 10mg  daily, metroprolol succinate 50 mg  2. Which pharmacy/location (including street and city if local pharmacy) is medication to be sent to? [facility] by [address]  3. Do they need a 30 day or 90 day supply? 30

## 2019-11-19 NOTE — Telephone Encounter (Signed)
metoprolol succinate (TOPROL-XL) 50 MG 24 hr tablet 30 tablet 3 11/19/2019    Sig: TAKE 1 TABLET(50 MG) BY MOUTH DAILY WITH OR IMMEDIATELY FOLLOWING A MEAL   Sent to pharmacy as: metoprolol succinate (TOPROL-XL) 50 MG 24 hr tablet   E-Prescribing Status: Receipt confirmed by pharmacy (11/19/2019  4:00 PM EDT)

## 2020-02-07 ENCOUNTER — Telehealth: Payer: Self-pay | Admitting: Internal Medicine

## 2020-02-07 NOTE — Telephone Encounter (Signed)
No answer. Left message to call back.   

## 2020-02-07 NOTE — Telephone Encounter (Signed)
Pt c/o swelling: STAT is pt has developed SOB within 24 hours  1) How much weight have you gained and in what time span? No weight gain  2) If swelling, where is the swelling located? Not sure, states his wife says he has swelling  3) Are you currently taking a fluid pill? no  4) Are you currently SOB? no  5) Do you have a log of your daily weights (if so, list)? no  6) Have you gained 3 pounds in a day or 5 pounds in a week? No weight gain 7) Have you traveled recently?  No  Patient states it is ok to leave a detailed message.

## 2020-02-10 NOTE — Telephone Encounter (Signed)
Spoke to patient.  His wife thinks his feet are swelling. Patient denies swelling in his legs, feet or ankles. Denies shortness of breath, chest pain, dizziness. He is agreeable to discuss with Dr End at his upcoming appointment on 02/26/20.

## 2020-02-26 ENCOUNTER — Other Ambulatory Visit: Payer: Self-pay

## 2020-02-26 ENCOUNTER — Encounter: Payer: Self-pay | Admitting: Internal Medicine

## 2020-02-26 ENCOUNTER — Ambulatory Visit: Payer: Medicare PPO | Admitting: Internal Medicine

## 2020-02-26 VITALS — BP 126/78 | HR 65 | Ht 68.0 in | Wt 162.0 lb

## 2020-02-26 DIAGNOSIS — R0609 Other forms of dyspnea: Secondary | ICD-10-CM

## 2020-02-26 DIAGNOSIS — R0600 Dyspnea, unspecified: Secondary | ICD-10-CM | POA: Diagnosis not present

## 2020-02-26 DIAGNOSIS — I493 Ventricular premature depolarization: Secondary | ICD-10-CM | POA: Diagnosis not present

## 2020-02-26 DIAGNOSIS — R0602 Shortness of breath: Secondary | ICD-10-CM

## 2020-02-26 MED ORDER — METOPROLOL SUCCINATE ER 50 MG PO TB24: ORAL_TABLET | ORAL | 2 refills | Status: DC

## 2020-02-26 MED ORDER — AMLODIPINE BESYLATE 10 MG PO TABS: 10.0000 mg | ORAL_TABLET | Freq: Every day | ORAL | 2 refills | Status: DC

## 2020-02-26 NOTE — Patient Instructions (Signed)
Medication Instructions:  Your physician recommends that you continue on your current medications as directed. Please refer to the Current Medication list given to you today.  *If you need a refill on your cardiac medications before your next appointment, please call your pharmacy*  Lab Work: Your physician recommends that you return for lab work in: TODAY - CBC, CMP, TSH.   If you have labs (blood work) drawn today and your tests are completely normal, you will receive your results only by:  MyChart Message (if you have MyChart) OR  A paper copy in the mail If you have any lab test that is abnormal or we need to change your treatment, we will call you to review the results.  Testing/Procedures: Your physician has requested that you have an echocardiogram. Echocardiography is a painless test that uses sound waves to create images of your heart. It provides your doctor with information about the size and shape of your heart and how well your hearts chambers and valves are working. This procedure takes approximately one hour. There are no restrictions for this procedure. You may get an IV, if needed, to receive an ultrasound enhancing agent through to better visualize your heart.   Follow-Up: At [facility], you and your health needs are our priority.  As part of our continuing mission to provide you with exceptional heart care, we have created designated Provider Care Teams.  These Care Teams include your primary Cardiologist (physician) and Advanced Practice Providers (APPs -  Physician Assistants and Nurse Practitioners) who all work together to provide you with the care you need, when you need it.  We recommend signing up for the patient portal called "MyChart".  Sign up information is provided on this After Visit Summary.  MyChart is used to connect with patients for Virtual Visits (Telemedicine).  Patients are able to view lab/test results, encounter notes, upcoming appointments, etc.   Non-urgent messages can be sent to your provider as well.   To learn more about what you can do with MyChart, go to [email or web address].    Your next appointment:   6 week(s)  The format for your next appointment:   In Person  Provider:   You may see Yvonne Kendall, MD or one of the following Advanced Practice Providers on your designated Care Team:    Nicolasa Ducking, NP  Eula Listen, PA-C  Marisue Ivan, PA-C  C[place], [place]
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
## 2020-02-27 ENCOUNTER — Ambulatory Visit (INDEPENDENT_AMBULATORY_CARE_PROVIDER_SITE_OTHER): Payer: Medicare PPO

## 2020-02-27 ENCOUNTER — Encounter: Payer: Self-pay | Admitting: Internal Medicine

## 2020-02-27 VITALS — Ht 68.0 in | Wt 162.0 lb

## 2020-02-27 DIAGNOSIS — Z0000 Encounter for general adult medical examination without abnormal findings: Secondary | ICD-10-CM

## 2020-02-27 DIAGNOSIS — R0609 Other forms of dyspnea: Secondary | ICD-10-CM | POA: Insufficient documentation

## 2020-02-27 LAB — COMPREHENSIVE METABOLIC PANEL
ALT: 11 IU/L (ref 0–44)
AST: 20 IU/L (ref 0–40)
Albumin/Globulin Ratio: 1.6 (ref 1.2–2.2)
Albumin: 4.4 g/dL (ref 3.6–4.6)
Alkaline Phosphatase: 50 IU/L (ref 44–121)
BUN/Creatinine Ratio: 16 (ref 10–24)
BUN: 16 mg/dL (ref 8–27)
Bilirubin Total: 0.8 mg/dL (ref 0.0–1.2)
CO2: 21 mmol/L (ref 20–29)
Calcium: 9.7 mg/dL (ref 8.6–10.2)
Chloride: 102 mmol/L (ref 96–106)
Creatinine, Ser: 1.02 mg/dL (ref 0.76–1.27)
GFR calc Af Amer: 77 mL/min/{1.73_m2} (ref >59)
GFR calc non Af Amer: 67 mL/min/{1.73_m2} (ref >59)
Globulin, Total: 2.7 g/dL (ref 1.5–4.5)
Glucose: 103 mg/dL — ABNORMAL HIGH (ref 65–99)
Potassium: 4.4 mmol/L (ref 3.5–5.2)
Sodium: 138 mmol/L (ref 134–144)
Total Protein: 7.1 g/dL (ref 6.0–8.5)

## 2020-02-27 LAB — CBC
Hematocrit: 40.9 % (ref 37.5–51.0)
Hemoglobin: 14 g/dL (ref 13.0–17.7)
MCH: 28.4 pg (ref 26.6–33.0)
MCHC: 34.2 g/dL (ref 31.5–35.7)
MCV: 83 fL (ref 79–97)
Platelets: 214 10*3/uL (ref 150–450)
RBC: 4.93 x10E6/uL (ref 4.14–5.80)
RDW: 12.7 % (ref 11.6–15.4)
WBC: 6.9 10*3/uL (ref 3.4–10.8)

## 2020-02-27 LAB — TSH: TSH: 1.59 u[IU]/mL (ref 0.450–4.500)

## 2020-02-27 NOTE — Patient Instructions (Addendum)
Joshua Sandoval , Thank you for taking time to come for your Medicare Wellness Visit. I appreciate your ongoing commitment to your health goals. Please review the following plan we discussed and let me know if I can assist you in the future.   These are the goals we discussed: Goals      Patient Stated     Increase physical activity (pt-stated)      I want to start lifting weights again        This is a list of the screening recommended for you and due dates:  Health Maintenance  Topic Date Due   Tetanus Vaccine  Never done   Flu Shot  12/15/2019   Pneumonia vaccines (2 of 2 - PPSV23) 01/01/2020   COVID-19 Vaccine  Completed    Immunizations Immunization History  Administered Date(s) Administered   Fluad Quad(high Dose 65+) 02/27/2019   Influenza,inj,Quad PF,6+ Mos 03/07/2018   Moderna SARS-COVID-2 Vaccination 06/24/2019, 07/24/2019   Pneumococcal Conjugate-13 01/01/2019   Conditions/risks identified: none new.  Follow up in one year for your annual wellness visit.   Preventive Care 40 Years and Older, Male Preventive care refmillsers to lifestyle choices and visits with your health care provider that can promote health and wellness. What does preventive care include?  A yearly physical exam. This is also called an annual well check.  Dental exams once or twice a year.  Routine eye exams. Ask your health care provider how often you should have your eyes checked.  Personal lifestyle choices, including:  Daily care of your teeth and gums.  Regular physical activity.  Eating a healthy diet.  Avoiding tobacco and drug use.  Limiting alcohol use.  Practicing safe sex.  Taking low doses of aspirin every day.  Taking vitamin and mineral supplements as recommended by your health care provider. What happens during an annual well check? The services and screenings done by your health care provider during your annual well check will depend on your age, overall  health, lifestyle risk factors, and family history of disease. Counseling  Your health care provider may ask you questions about your:  Alcohol use.  Tobacco use.  Drug use.  Emotional well-being.  Home and relationship well-being.  Sexual activity.  Eating habits.  History of falls.  Memory and ability to understand (cognition).  Work and work Astronomer. Screening  You may have the following tests or measurements:  Height, weight, and BMI.  Blood pressure.  Lipid and cholesterol levels. These may be checked every 5 years, or more frequently if you are over 86 years old.  Skin check.  Lung cancer screening. You may have this screening every year starting at age 28 if you have a 30-pack-year history of smoking and currently smoke or have quit within the past 15 years.  Fecal occult blood test (FOBT) of the stool. You may have this test every year starting at age 47.  Flexible sigmoidoscopy or colonoscopy. You may have a sigmoidoscopy every 5 years or a colonoscopy every 10 years starting at age 67.  Prostate cancer screening. Recommendations will vary depending on your family history and other risks.  Hepatitis C blood test.  Hepatitis B blood test.  Sexually transmitted disease (STD) testing.  Diabetes screening. This is done by checking your blood sugar (glucose) after you have not eaten for a while (fasting). You may have this done every 1-3 years.  Abdominal aortic aneurysm (AAA) screening. You may need this if you are a current or  former smoker.  Osteoporosis. You may be screened starting at age 66 if you are at high risk. Talk with your health care provider about your test results, treatment options, and if necessary, the need for more tests. Vaccines  Your health care provider may recommend certain vaccines, such as:  Influenza vaccine. This is recommended every year.  Tetanus, diphtheria, and acellular pertussis (Tdap, Td) vaccine. You may need a Td  booster every 10 years.  Zoster vaccine. You may need this after age 78.  Pneumococcal 13-valent conjugate (PCV13) vaccine. One dose is recommended after age 27.  Pneumococcal polysaccharide (PPSV23) vaccine. One dose is recommended after age 25. Talk to your health care provider about which screenings and vaccines you need and how often you need them. This information is not intended to replace advice given to you by your health care provider. Make sure you discuss any questions you have with your health care provider. Document Released: 05/29/2015 Document Revised: 01/20/2016 Document Reviewed: 03/03/2015 Elsevier Interactive Patient Education  2017 [employer].  Fall Prevention in the Home Falls can cause injuries. They can happen to people of all ages. There are many things you can do to make your home safe and to help prevent falls. What can I do on the outside of my home?  Regularly fix the edges of walkways and driveways and fix any cracks.  Remove anything that might make you trip as you walk through a door, such as a raised step or threshold.  Trim any bushes or trees on the path to your home.  Use bright outdoor lighting.  Clear any walking paths of anything that might make someone trip, such as rocks or tools.  Regularly check to see if handrails are loose or broken. Make sure that both sides of any steps have handrails.  Any raised decks and porches should have guardrails on the edges.  Have any leaves, snow, or ice cleared regularly.  Use sand or salt on walking paths during winter.  Clean up any spills in your garage right away. This includes oil or grease spills. What can I do in the bathroom?  Use night lights.  Install grab bars by the toilet and in the tub and shower. Do not use towel bars as grab bars.  Use non-skid mats or decals in the tub or shower.  If you need to sit down in the shower, use a plastic, non-slip stool.  Keep the floor dry. Clean up  any water that spills on the floor as soon as it happens.  Remove soap buildup in the tub or shower regularly.  Attach bath mats securely with double-sided non-slip rug tape.  Do not have throw rugs and other things on the floor that can make you trip. What can I do in the bedroom?  Use night lights.  Make sure that you have a light by your bed that is easy to reach.  Do not use any sheets or blankets that are too big for your bed. They should not hang down onto the floor.  Have a firm chair that has side arms. You can use this for support while you get dressed.  Do not have throw rugs and other things on the floor that can make you trip. What can I do in the kitchen?  Clean up any spills right away.  Avoid walking on wet floors.  Keep items that you use a lot in easy-to-reach places.  If you need to reach something above you, use  a strong step stool that has a grab bar.  Keep electrical cords out of the way.  Do not use floor polish or wax that makes floors slippery. If you must use wax, use non-skid floor wax.  Do not have throw rugs and other things on the floor that can make you trip. What can I do with my stairs?  Do not leave any items on the stairs.  Make sure that there are handrails on both sides of the stairs and use them. Fix handrails that are broken or loose. Make sure that handrails are as long as the stairways.  Check any carpeting to make sure that it is firmly attached to the stairs. Fix any carpet that is loose or worn.  Avoid having throw rugs at the top or bottom of the stairs. If you do have throw rugs, attach them to the floor with carpet tape.  Make sure that you have a light switch at the top of the stairs and the bottom of the stairs. If you do not have them, ask someone to add them for you. What else can I do to help prevent falls?  Wear shoes that:  Do not have high heels.  Have rubber bottoms.  Are comfortable and fit you well.  Are  closed at the toe. Do not wear sandals.  If you use a stepladder:  Make sure that it is fully opened. Do not climb a closed stepladder.  Make sure that both sides of the stepladder are locked into place.  Ask someone to hold it for you, if possible.  Clearly mark and make sure that you can see:  Any grab bars or handrails.  First and last steps.  Where the edge of each step is.  Use tools that help you move around (mobility aids) if they are needed. These include:  Canes.  Walkers.  Scooters.  Crutches.  Turn on the lights when you go into a dark area. Replace any light bulbs as soon as they burn out.  Set up your furniture so you have a clear path. Avoid moving your furniture around.  If any of your floors are uneven, fix them.  If there are any pets around you, be aware of where they are.  Review your medicines with your doctor. Some medicines can make you feel dizzy. This can increase your chance of falling. Ask your doctor what other things that you can do to help prevent falls. This information is not intended to replace advice given to you by your health care provider. Make sure you discuss any questions you have with your health care provider. Document Released: 02/26/2009 Document Revised: 10/08/2015 Document Reviewed: 06/06/2014 Elsevier Interactive Patient Education  2017 [employer].

## 2020-02-27 NOTE — Progress Notes (Addendum)
Subjective:   Joshua Sandoval is a 86 y.o. male who presents for Medicare Annual/Subsequent preventive examination.  Review of Systems    No ROS.  Medicare Wellness Virtual Visit.  Cardiac Risk Factors include: advanced age (>68men, >29 women);hypertension;male gender     Objective:    Today's Vitals   02/27/20 1304  Weight: 162 lb (73.5 kg)  Height: 5\' 8"  (1.727 m)   Body mass index is 24.63 kg/m.  Advanced Directives 02/27/2020 02/26/2019 02/23/2018  Does Patient Have a Medical Advance Directive? No No No  Would patient like information on creating a medical advance directive? No - Patient declined No - Patient declined -    Current Medications (verified) Outpatient Encounter Medications as of 02/27/2020  Medication Sig  . amLODipine (NORVASC) 10 MG tablet Take 1 tablet (10 mg total) by mouth daily.  02/29/2020 aspirin EC 81 MG tablet Take 81 mg by mouth as needed.   . metoprolol succinate (TOPROL-XL) 50 MG 24 hr tablet TAKE 1 TABLET(50 MG) BY MOUTH DAILY WITH OR IMMEDIATELY FOLLOWING A MEAL   No facility-administered encounter medications on file as of 02/27/2020.    Allergies (verified) Other   History: Past Medical History:  Diagnosis Date  . Diastolic dysfunction    a. 12/2017 Echo: EF 50-55%, no rwma, Gr1 DD, nl RV fxn.  . Hypertension   . PVC's (premature ventricular contractions)   . RBBB    Past Surgical History:  Procedure Laterality Date  . NO PAST SURGERIES     Family History  Problem Relation Age of Onset  . Hypertension Mother   . Stroke Mother    Social History   Socioeconomic History  . Marital status: Married    Spouse name: Not on file  . Number of children: Not on file  . Years of education: Not on file  . Highest education level: Not on file  Occupational History  . Not on file  Tobacco Use  . Smoking status: Former Smoker    Types: Cigarettes    Quit date: 1980    Years since quitting: 41.8  . Smokeless tobacco: Never Used  Substance and  Sexual Activity  . Alcohol use: Not Currently  . Drug use: No  . Sexual activity: Yes    Partners: Female  Other Topics Concern  . Not on file  Social History Narrative  . Not on file   Social Determinants of Health   Financial Resource Strain: Low Risk   . Difficulty of Paying Living Expenses: Not hard at all  Food Insecurity: No Food Insecurity  . Worried About 01/2018 in the Last Year: Never true  . Ran Out of Food in the Last Year: Never true  Transportation Needs: No Transportation Needs  . Lack of Transportation (Medical): No  . Lack of Transportation (Non-Medical): No  Physical Activity:   . Days of Exercise per Week: Not on file  . Minutes of Exercise per Session: Not on file  Stress: No Stress Concern Present  . Feeling of Stress : Not at all  Social Connections:   . Frequency of Communication with Friends and Family: Not on file  . Frequency of Social Gatherings with Friends and Family: Not on file  . Attends Religious Services: Not on file  . Active Member of Clubs or Organizations: Not on file  . Attends Programme researcher, broadcasting/film/video Meetings: Not on file  . Marital Status: Not on file    Tobacco Counseling Counseling given: Not Answered  Clinical Intake:  Pre-visit preparation completed: Yes        Diabetes: No  How often do you need to have someone help you when you read instructions, pamphlets, or other written materials from your doctor or pharmacy?: 1 - Never  Interpreter Needed?: No      Activities of Daily Living In your present state of health, do you have any difficulty performing the following activities: 02/27/2020  Hearing? N  Vision? N  Difficulty concentrating or making decisions? N  Walking or climbing stairs? N  Dressing or bathing? N  Doing errands, shopping? N  Preparing Food and eating ? N  Using the Toilet? N  In the past six months, have you accidently leaked urine? N  Do you have problems with loss of bowel  control? N  Managing your Medications? N  Managing your Finances? N  Housekeeping or managing your Housekeeping? N  Some recent data might be hidden    Patient Care Team: Allegra Grana, FNP as PCP - General (Family Medicine) End, Cristal Deer, MD as PCP - Cardiology (Cardiology)  Indicate any recent Medical Services you may have received from other than [facility] providers in the past year (date may be approximate).     Assessment:   This is a routine wellness examination for Joshua Sandoval.  I connected with Joshua Sandoval today by telephone and verified that I am speaking with the correct person using two identifiers. Location patient: home Location provider: work Persons participating in the virtual visit: patient, Engineer, civil (consulting).    I discussed the limitations, risks, security and privacy concerns of performing an evaluation and management service by telephone and the availability of in person appointments. The patient expressed understanding and verbally consented to this telephonic visit.    Interactive audio and video telecommunications were attempted between this provider and patient, however failed, due to patient having technical difficulties OR patient did not have access to video capability.  We continued and completed visit with audio only.  Some vital signs may be absent or patient reported.   Hearing/Vision screen  Hearing Screening   125Hz  250Hz  500Hz  1000Hz  2000Hz  3000Hz  4000Hz  6000Hz  8000Hz   Right ear:           Left ear:           Comments: Patient is able to hear conversational tones without difficulty. No issues reported.  Vision Screening Comments: Visual acuity not assessed, virtual visit  Dietary issues and exercise activities discussed: Current Exercise Habits: Home exercise routine, Type of exercise: walking, Intensity: Mild  Goals      Patient Stated   .  Increase physical activity (pt-stated)      I want to start lifting weights again       Depression Screen PHQ 2/9  Scores 02/27/2020 02/26/2019 09/14/2016  PHQ - 2 Score 0 0 0  PHQ- 9 Score - - 0    Fall Risk Fall Risk  02/27/2020 02/26/2019 09/14/2016  Falls in the past year? 0 0 No  Number falls in past yr: 0 - -  Follow up Falls evaluation completed - -   Handrails in use when climbing stairs? Yes Home free of loose throw rugs in walkways, pet beds, electrical cords, etc? Yes  Adequate lighting in your home to reduce risk of falls? Yes   TIMED UP AND GO: Was the test performed? No . Virtual visit.   Cognitive Function: Patient is alert and oriented x3.  Denies difficulty focusing, making decisions, memory loss.  6CIT Screen 02/26/2019  What Year? 0 points  What month? 0 points  What time? 0 points  Count back from 20 0 points  Months in reverse 0 points  Repeat phrase 2 points  Total Score 2    Immunizations Immunization History  Administered Date(s) Administered  . Fluad Quad(high Dose 65+) 02/27/2019  . Influenza,inj,Quad PF,6+ Mos 03/07/2018  . Moderna SARS-COVID-2 Vaccination 06/24/2019, 07/24/2019  . Pneumococcal Conjugate-13 01/01/2019   Health Maintenance Health Maintenance  Topic Date Due  . TETANUS/TDAP  Never done  . INFLUENZA VACCINE  12/15/2019  . PNA vac Low Risk Adult (2 of 2 - PPSV23) 01/01/2020  . COVID-19 Vaccine  Completed   Dental Screening: Recommended annual dental exams for proper oral hygiene  Community Resource Referral / Chronic Care Management: CRR required this visit?  No   CCM required this visit?  No      Plan:   Keep all routine maintenance appointments.   I have personally reviewed and noted the following in the patient's chart:   . Medical and social history . Use of alcohol, tobacco or illicit drugs  . Current medications and supplements . Functional ability and status . Nutritional status . Physical activity . Advanced directives . List of other physicians . Hospitalizations, surgeries, and ER visits in previous 12  months . Vitals . Screenings to include cognitive, depression, and falls . Referrals and appointments  In addition, I have reviewed and discussed with patient certain preventive protocols, quality metrics, and best practice recommendations. A written personalized care plan for preventive services as well as general preventive health recommendations were provided to patient via mychart.     Ashok Pall, LPN   [id number]/67/1245   Agree with plan. Rennie Plowman, NP

## 2020-03-17 ENCOUNTER — Other Ambulatory Visit: Payer: Self-pay

## 2020-03-17 ENCOUNTER — Ambulatory Visit (INDEPENDENT_AMBULATORY_CARE_PROVIDER_SITE_OTHER): Payer: Medicare PPO

## 2020-03-17 DIAGNOSIS — R0609 Other forms of dyspnea: Secondary | ICD-10-CM

## 2020-03-17 DIAGNOSIS — R0600 Dyspnea, unspecified: Secondary | ICD-10-CM

## 2020-03-17 DIAGNOSIS — I493 Ventricular premature depolarization: Secondary | ICD-10-CM | POA: Diagnosis not present

## 2020-03-17 LAB — ECHOCARDIOGRAM COMPLETE
Area-P 1/2: 2.75 cm2
S' Lateral: 3 cm

## 2020-03-19 ENCOUNTER — Telehealth: Payer: Self-pay | Admitting: *Deleted

## 2020-03-19 DIAGNOSIS — R0609 Other forms of dyspnea: Secondary | ICD-10-CM

## 2020-03-19 DIAGNOSIS — R0600 Dyspnea, unspecified: Secondary | ICD-10-CM

## 2020-03-19 NOTE — Telephone Encounter (Signed)
-----   Message from Yvonne Kendall, MD sent at 03/19/2020  6:26 AM EDT ----- Please let Mr. Youngblood know that his heart is contracting well though it is somewhat stiff could be contributing to some of his dyspnea on exertion.  I recommend that we obtain a pharmacologic myocardial perfusion stress test to exclude underlying ischemia.

## 2020-03-19 NOTE — Telephone Encounter (Addendum)
Results called to pt. Pt verbalized understanding. He is agreeable to [employer]. He verbalized understanding of the instructions below.   [facility] LEXISCAN MYOVIEW  Your caregiver has ordered a Stress Test with nuclear imaging. The purpose of this test is to evaluate the blood supply to your heart muscle. This procedure is referred to as a "Non-Invasive Stress Test." This is because other than having an IV started in your vein, nothing is inserted or "invades" your body. Cardiac stress tests are done to find areas of poor blood flow to the heart by determining the extent of coronary artery disease (CAD). Some patients exercise on a treadmill, which naturally increases the blood flow to your heart, while others who are  unable to walk on a treadmill due to physical limitations have a pharmacologic/chemical stress agent called Lexiscan . This medicine will mimic walking on a treadmill by temporarily increasing your coronary blood flow.   Please note: these test may take anywhere between 2-4 hours to complete  PLEASE REPORT TO [facility]  THE VOLUNTEERS AT THE FIRST DESK WILL DIRECT YOU WHERE TO GO  Date of Procedure:_______11/12/21_______________  Arrival Time for Procedure:______08:15 am_____________   PLEASE NOTIFY THE OFFICE AT LEAST 24 HOURS IN ADVANCE IF YOU ARE UNABLE TO KEEP YOUR APPOINTMENT.  [phone] AND  PLEASE NOTIFY NUCLEAR MEDICINE AT [facility] AT LEAST 24 HOURS IN ADVANCE IF YOU ARE UNABLE TO KEEP YOUR APPOINTMENT. [phone]  How to prepare for your Myoview test:  1. Do not eat or drink after midnight 2. No caffeine for 24 hours prior to test 3. No smoking 24 hours prior to test. 4. Your medication may be taken with water.  If your doctor stopped a medication because of this test, do not take that medication. 5. Please wear a short sleeve shirt. 6. No perfume, cologne or lotion. 7. Wear comfortable walking shoes.

## 2020-03-27 ENCOUNTER — Other Ambulatory Visit: Payer: Medicare PPO

## 2020-04-08 ENCOUNTER — Encounter: Payer: Self-pay | Admitting: Nurse Practitioner

## 2020-04-08 ENCOUNTER — Ambulatory Visit: Payer: Medicare PPO | Admitting: Nurse Practitioner

## 2020-04-08 NOTE — Progress Notes (Deleted)
Office Visit    Patient Name: Joshua Sandoval Date of Encounter: 04/08/2020  Primary Care Provider:  Allegra Grana, FNP Primary Cardiologist:  Yvonne Kendall, MD  Chief Complaint    86 year old male with a history of hypertension, frequent PVCs, right bundle branch block, diastolic dysfunction, resents for follow-up secondary to dyspnea on exertion.  Past Medical History    Past Medical History:  Diagnosis Date  . Diastolic dysfunction    a. 12/2017 Echo: EF 50-55%, no rwma, Gr1 DD, nl RV fxn; b. 03/2020 Echo: EF 55-60%, no rwma, Gr1 DD, Mildly red RV fxn, triv MR, mild to mod AoV sclerosis w/o stenosis.  Marland Kitchen History of stress test    a. 01/2018 MV: EF 55%. No ischemia/infarct-->Low risk.  Marland Kitchen Hypertension   . PVC's (premature ventricular contractions)   . RBBB    Past Surgical History:  Procedure Laterality Date  . NO PAST SURGERIES      Allergies  Allergies  Allergen Reactions  . Other     NKDA    History of Present Illness    86 year old male with above past medical history including hypertension, frequent PVCs, right bundle branch block, and diastolic dysfunction.  He was previously evaluated for poorly controlled hypertension in August 2019 with echocardiogram showing an EF of 50-55% with grade 1 diastolic dysfunction.  Frequent PVCs were noted during echocardiogram and thus stress testing was undertaken and was low risk.  He was most recently seen October 13 secondary to worsening fatigue and shortness of breath.  Repeat echocardiogram showed an EF of 55-60% with grade 1 diastolic dysfunction, mildly reduced RV function, trivial MR, and mild to moderate aortic sclerosis.  Recommendation was made for stress testing.  Home Medications    Prior to Admission medications   Medication Sig Start Date End Date Taking? Authorizing Provider  amLODipine (NORVASC) 10 MG tablet Take 1 tablet (10 mg total) by mouth daily. 02/26/20 05/26/20  End, Cristal Deer, MD  aspirin EC 81 MG  tablet Take 81 mg by mouth as needed.     [provider]  metoprolol succinate (TOPROL-XL) 50 MG 24 hr tablet TAKE 1 TABLET(50 MG) BY MOUTH DAILY WITH OR IMMEDIATELY FOLLOWING A MEAL 02/26/20   End, Cristal Deer, MD    Review of Systems    ***.  All other systems reviewed and are otherwise negative except as noted above.  Physical Exam    VS:  There were no vitals taken for this visit. , BMI There is no height or weight on file to calculate BMI. GEN: Well nourished, well developed, in no acute distress. HEENT: normal. Neck: Supple, no JVD, carotid bruits, or masses. Cardiac: RRR, no murmurs, rubs, or gallops. No clubbing, cyanosis, edema.  Radials/DP/PT 2+ and equal bilaterally.  Respiratory:  Respirations regular and unlabored, clear to auscultation bilaterally. GI: Soft, nontender, nondistended, BS + x 4. MS: no deformity or atrophy. Skin: warm and dry, no rash. Neuro:  Strength and sensation are intact. Psych: Normal affect.  Accessory Clinical Findings    ECG personally reviewed by me today - *** - no acute changes.  Lab Results  Component Value Date   WBC 6.9 02/26/2020   HGB 14.0 02/26/2020   HCT 40.9 02/26/2020   MCV 83 02/26/2020   PLT 214 02/26/2020   Lab Results  Component Value Date   CREATININE 1.02 02/26/2020   BUN 16 02/26/2020   NA 138 02/26/2020   K 4.4 02/26/2020   CL 102 02/26/2020  CO2 21 02/26/2020   Lab Results  Component Value Date   ALT 11 02/26/2020   AST 20 02/26/2020   ALKPHOS 50 02/26/2020   BILITOT 0.8 02/26/2020   Lab Results  Component Value Date   CHOL 224 (H) 01/29/2018   HDL 59 01/29/2018   LDLCALC 146 (H) 01/29/2018   LDLDIRECT 150 (H) 01/29/2018   TRIG 93 01/29/2018   CHOLHDL 3.8 01/29/2018    No results found for: HGBA1C  Assessment & Plan    1.  ***   Nicolasa Ducking, NP 04/08/2020, 10:41 AM

## 2020-04-13 ENCOUNTER — Encounter: Payer: Self-pay | Admitting: Nurse Practitioner

## 2020-07-03 ENCOUNTER — Ambulatory Visit: Payer: Medicare PPO | Admitting: Nurse Practitioner

## 2020-07-03 ENCOUNTER — Encounter: Payer: Self-pay | Admitting: Nurse Practitioner

## 2020-07-03 ENCOUNTER — Other Ambulatory Visit: Payer: Self-pay

## 2020-07-03 VITALS — BP 120/70 | HR 65 | Ht 70.0 in | Wt 164.0 lb

## 2020-07-03 DIAGNOSIS — I493 Ventricular premature depolarization: Secondary | ICD-10-CM

## 2020-07-03 DIAGNOSIS — I452 Bifascicular block: Secondary | ICD-10-CM

## 2020-07-03 DIAGNOSIS — I10 Essential (primary) hypertension: Secondary | ICD-10-CM

## 2020-07-03 DIAGNOSIS — R0600 Dyspnea, unspecified: Secondary | ICD-10-CM | POA: Diagnosis not present

## 2020-07-03 DIAGNOSIS — R0609 Other forms of dyspnea: Secondary | ICD-10-CM

## 2020-07-03 NOTE — Progress Notes (Signed)
Office Visit    Patient Name: Joshua Sandoval Date of Encounter: 07/03/2020  Primary Care Provider:  Allegra Grana, FNP Primary Cardiologist:  Yvonne Kendall, MD  Chief Complaint    87 y/o male with a history of HTN, frequent PVCs, RBBB, diastolic dysfunction, for follow up because he would like to travel to [place] by plane and train to see his daughter. He has no complaints.   Past Medical History    Past Medical History:  Diagnosis Date  . Diastolic dysfunction    a. 12/2017 Echo: EF 50-55%, no rwma, Gr1 DD, nl RV fxn; b. 03/2020 Echo: EF 55-60%, no rwma, Gr1 DD, Mildly red RV fxn, triv MR, mild to mod AoV sclerosis w/o stenosis.  Marland Kitchen History of stress test    a. 01/2018 MV: EF 55%. No ischemia/infarct-->Low risk.  Marland Kitchen Hypertension   . PVC's (premature ventricular contractions)   . RBBB    Past Surgical History:  Procedure Laterality Date  . NO PAST SURGERIES      Allergies  Allergies  Allergen Reactions  . Other     NKDA    History of Present Illness    87 y/o male with a history of HTN, frequent PVCs, RBBB, diastolic dysfunction, for follow-up of HTN, RBBB, frequent PVCs. He established care in 2019 for HTN per referral from PCP and has been followed for RBBB, HTN, and frequent PVCs. He was last seen in Oct 2021 by Dr. Okey Dupre with c/o worsening fatigue and dyspnea on exertion and was advised to have lab and echo. His lab work was unremarkable and gave no obvious explanation for symptoms.  His echo done 11/21 showed LVEF 55-60%, no RWA, with G1DD that Dr. Okey Dupre felt could be contributing to dyspnea on exertion. He was advised to schedule a lexiscan myoview but the patient did not complete the test.  Today, he reports that he has been doing well since his last visit and that he did not complete the stress test because he was out of town. He reports that he is feeling better but does not give a clear explanation for improvement in symptoms of fatigue and dyspnea. He denies  specific complaints but would like to travel to [place] to see his daughter's family and wants "clearance" to do so. He will travel alone.  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. He states he has not been walking as much recently because of the weather and he likes to walk outside around the track at the school. He does not have palpitations or other concerns with his PVC burden. He states he thinks he is doing very well for his age.   Home Medications    Prior to Admission medications   Medication Sig Start Date End Date Taking? Authorizing Provider  amLODipine (NORVASC) 10 MG tablet Take 1 tablet (10 mg total) by mouth daily. 02/26/20 05/26/20  End, Cristal Deer, MD  aspirin EC 81 MG tablet Take 81 mg by mouth as needed.     [provider]  metoprolol succinate (TOPROL-XL) 50 MG 24 hr tablet TAKE 1 TABLET(50 MG) BY MOUTH DAILY WITH OR IMMEDIATELY FOLLOWING A MEAL 02/26/20   End, Cristal Deer, MD    Review of Systems    He denies chest discomfort, dyspnea, palpitations, edema, syncope, orthopnea, early satiety, or PND.  All other systems reviewed and are otherwise negative except as noted above.  Physical Exam    VS:   Today's  Vitals   07/03/20 1116  BP: 120/70  Pulse: 65  SpO2: 98%  Weight: 164 lb (74.4 kg)  Height: 5\' 10"  (1.778 m)   BMI Body mass index is 23.53 kg/m. GEN: Well nourished, well developed, in no acute distress. HEENT: normal. Neck: Supple, no JVD, carotid bruits, or masses. Cardiac: Irregular, no murmurs, rubs, or gallops. No clubbing, cyanosis, edema.  Radials/PT 2+ and equal bilaterally.  Respiratory:  Respirations regular and unlabored, clear to auscultation bilaterally. GI: Soft, nontender, nondistended, BS + x 4. MS: no deformity or atrophy. Skin: warm and dry, no rash. Neuro:  Strength and sensation are intact. Psych: Normal affect.  Accessory Clinical Findings    ECG personally reviewed by me today - RSR @  65 bpm with RBBB and left ant fascicular block - no acute changes.  Lab Results  Component Value Date   WBC 6.9 02/26/2020   HGB 14.0 02/26/2020   HCT 40.9 02/26/2020   MCV 83 02/26/2020   PLT 214 02/26/2020   Lab Results  Component Value Date   CREATININE 1.02 02/26/2020   BUN 16 02/26/2020   NA 138 02/26/2020   K 4.4 02/26/2020   CL 102 02/26/2020   CO2 21 02/26/2020   Lab Results  Component Value Date   ALT 11 02/26/2020   AST 20 02/26/2020   ALKPHOS 50 02/26/2020   BILITOT 0.8 02/26/2020   Lab Results  Component Value Date   CHOL 224 (H) 01/29/2018   HDL 59 01/29/2018   LDLCALC 146 (H) 01/29/2018   LDLDIRECT 150 (H) 01/29/2018   TRIG 93 01/29/2018   CHOLHDL 3.8 01/29/2018     Assessment & Plan    1.  Essential HTN: BP stable @ 120/70 today. He reports similar readings at home. He reports tolerating Toprol and amlodipine without adverse effect. He states he has reduced his intake of hot dogs because he needed to be more careful with the sodium in his diet. He exercises on a regular basis without concerning symptoms. Continue current medications.   2. Dyspnea on exertion: He reports that he is feeling well without shortness of breath or activity intolerance. His echo Nov 2021 showed diastolic dysfunction and he was advised to complete pharmacologic nuclear stress test which he did not complete. He does not give a clear explanation of resolution of symptoms but is adamant that he feels well and has no concerns with SOB. If symptoms return, we can discuss scheduling the stress test.   3. Bifascicular block: Ecg today shows no acute change from previous. No significant bradycardia.  He is asymptomatic without pre-syncope, syncope, palpitations, dizziness, lightheadedness, fatigue, dyspnea, or chest discomfort. Echo done Nov 2021 shows normal LVEF. Continue to monitor.   4. PVCs: He continues to be asymptomatic and has normal LVEF as noted above. Tolerating Toprol XL 50 mg  without difficulty. Will continue  blocker.   5. Disposition: Continue current medications and follow up in 6 months with Dr. Dec 2021 or APP.    Okey Dupre, NP 07/03/2020, 5:36 PM

## 2020-07-03 NOTE — Patient Instructions (Signed)
Medication Instructions:   Your physician recommends that you continue on your current medications as directed. Please refer to the Current Medication list given to you today.  *If you need a refill on your cardiac medications before your next appointment, please call your pharmacy*   Lab Work: None ordered    Testing/Procedures: None ordered   Follow-Up: At [facility], you and your health needs are our priority.  As part of our continuing mission to provide you with exceptional heart care, we have created designated Provider Care Teams.  These Care Teams include your primary Cardiologist (physician) and Advanced Practice Providers (APPs -  Physician Assistants and Nurse Practitioners) who all work together to provide you with the care you need, when you need it.  We recommend signing up for the patient portal called "MyChart".  Sign up information is provided on this After Visit Summary.  MyChart is used to connect with patients for Virtual Visits (Telemedicine).  Patients are able to view lab/test results, encounter notes, upcoming appointments, etc.  Non-urgent messages can be sent to your provider as well.   To learn more about what you can do with MyChart, go to [email or web address].    Your next appointment:   6 month(s)  The format for your next appointment:   In Person  Provider:   You may see Christopher End, MD or one of the following Advanced Practice Providers on your designated Care Team:    Christopher Berge, NP   

## 2020-12-06 ENCOUNTER — Other Ambulatory Visit: Payer: Self-pay | Admitting: Internal Medicine

## 2021-01-06 ENCOUNTER — Ambulatory Visit: Payer: Medicare PPO | Admitting: Internal Medicine

## 2021-01-06 ENCOUNTER — Other Ambulatory Visit: Payer: Self-pay

## 2021-01-06 ENCOUNTER — Encounter: Payer: Self-pay | Admitting: Internal Medicine

## 2021-01-06 VITALS — BP 154/78 | HR 85 | Ht 66.0 in | Wt 160.2 lb

## 2021-01-06 DIAGNOSIS — I10 Essential (primary) hypertension: Secondary | ICD-10-CM | POA: Diagnosis not present

## 2021-01-06 DIAGNOSIS — R0600 Dyspnea, unspecified: Secondary | ICD-10-CM

## 2021-01-06 DIAGNOSIS — I493 Ventricular premature depolarization: Secondary | ICD-10-CM

## 2021-01-06 DIAGNOSIS — R0609 Other forms of dyspnea: Secondary | ICD-10-CM

## 2021-01-06 MED ORDER — LOSARTAN POTASSIUM 25 MG PO TABS: 25.0000 mg | ORAL_TABLET | Freq: Every day | ORAL | 2 refills | Status: DC

## 2021-01-06 NOTE — Progress Notes (Signed)
Follow-up Outpatient Visit Date: 01/06/2021  Primary Care Provider: Burnard Hawthorne, FNP [address] [place] [address]  Chief Complaint: Follow-up PVC's and hypertension  HPI:  Joshua Sandoval is a 87 y.o. male with history of hypertension, frequent PVCs, and right bundle branch block, who presents for follow-up of hypertension and PVCs.  I last saw him in 02/2020, at which time he reported worsening fatigue and exertional dyspnea.  Subsequent echo showed preserved LVEF with grade 1 diastolic dysfunction and elevated filling pressures.  Moderately reduced RV function was noted.  No significant valvular abnormality was seen.  Myocardial perfusion stress test was recommended, though it does not appear that this was ever performed.  Today, Joshua Sandoval reports that he has been feeling fairly well.  He notes mild shortness of breath at times but continues to go to the [facility] to exercise.  He reports an ill-defined feeling in his chest from time to time, which he states is not painful but is unable to describe further.  It is not exertional.  He denies palpitations, lightheadedness, and edema.  Home BP readings are typically similar to today's reading in the office.  --------------------------------------------------------------------------------------------------  Cardiovascular History & Procedures: Cardiovascular Problems: Hypertension Frequent PVC's RBBB   Risk Factors: Hypertension, male gender, and age greater than 57   Cath/PCI: None   CV Surgery: None   EP Procedures and Devices: Holter monitor (02/21/18): Predominantly sinus rhythm with frequent PVC's (15% burden), rare PAC's, and brief atrial runs.   Non-Invasive Evaluation(s): Echo (03/17/2020): Normal LV size with mild asymmetric hypertrophy of the basal septum.  LVEF 55-60% with grade 1 diastolic dysfunction and elevated filling pressure.  Normal RV size with mildly reduced function.  Normal biatrial size.  Mild MAC  with trivial mitral regurgitation.  Trivial tricuspid regurgitation.  Aortic sclerosis without stenosis.  Normal CVP. Echo (01/01/18): Normal LV size with LVEF 50-55% and grade 1 diastolic dysfunction.  Normal RV size and function.  Normal PA pressure.  Frequent PVC's noted during study. Exercise MPI (02/06/18): Low risk study without ischemia or scar.  Normal LVEF.  Poor exercise capacity.  Recent CV Pertinent Labs: Lab Results  Component Value Date   CHOL 224 (H) 01/29/2018   HDL 59 01/29/2018   LDLCALC 146 (H) 01/29/2018   LDLDIRECT 150 (H) 01/29/2018   TRIG 93 01/29/2018   CHOLHDL 3.8 01/29/2018   K 4.4 02/26/2020   MG 2.0 01/29/2018   BUN 16 02/26/2020   CREATININE 1.02 02/26/2020    Past medical and surgical history were reviewed and updated in EPIC.  Current Meds  Medication Sig   amLODipine (NORVASC) 10 MG tablet TAKE 1 TABLET(10 MG) BY MOUTH DAILY   aspirin EC 81 MG tablet Take 81 mg by mouth as needed.    metoprolol succinate (TOPROL-XL) 50 MG 24 hr tablet TAKE 1 TABLET(50 MG) BY MOUTH DAILY WITH OR IMMEDIATELY FOLLOWING A MEAL    Allergies: Other  Social History   Tobacco Use   Smoking status: Former    Types: Cigarettes    Quit date: 1980    Years since quitting: 42.6   Smokeless tobacco: Never  Substance Use Topics   Alcohol use: Not Currently   Drug use: No    Family History  Problem Relation Age of Onset   Hypertension Mother    Stroke Mother     Review of Systems: A 12-system review of systems was performed and was negative except as noted in the HPI.  --------------------------------------------------------------------------------------------------  Physical Exam: BP (!) 154/78 (BP Location: Left Arm, Patient Position: Sitting, Cuff Size: Normal)   Pulse 85   Ht _0  (1.676 m)   Wt 160 lb 4 oz (72.7 kg)   SpO2 97%   BMI 25.87 kg/m   General:  NAD. Neck: No JVD or HJR. Lungs: Clear to auscultation bilaterally without wheezes or  crackles. Heart: Irregular rhythm with 1/6 systolic murmur. Abdomen: Soft, nontender, nondistended. Extremities: No lower extremity edema.  EKG:  NSR with frequent PVC's and ventricular couplets, LVH, and RBBB.  Compared with prior tracing form 07/03/2020, LAFB is no longer present.  Frequent PVC's are noted today.  Lab Results  Component Value Date   WBC 6.9 02/26/2020   HGB 14.0 02/26/2020   HCT 40.9 02/26/2020   MCV 83 02/26/2020   PLT 214 02/26/2020    Lab Results  Component Value Date   NA 138 02/26/2020   K 4.4 02/26/2020   CL 102 02/26/2020   CO2 21 02/26/2020   BUN 16 02/26/2020   CREATININE 1.02 02/26/2020   GLUCOSE 103 (H) 02/26/2020   ALT 11 02/26/2020    Lab Results  Component Value Date   CHOL 224 (H) 01/29/2018   HDL 59 01/29/2018   LDLCALC 146 (H) 01/29/2018   LDLDIRECT 150 (H) 01/29/2018   TRIG 93 01/29/2018   CHOLHDL 3.8 01/29/2018    --------------------------------------------------------------------------------------------------  ASSESSMENT AND PLAN: Frequent PVC's: Asymptomatic but very frequent on today's EKG.  Prior Holter monitor in 02/2018 showed 15% PVC burden.  We previously discussed MPI, though this was never completed.  We have agreed to move forward with MPI to evaluate for underlying ischemia that may be contributing to PVC's.  Based on results, we will need to consider referral to EP.  Continue current dose of metoprolol.  We will check a BMP, Mg level, and TSH today.  Hypertension: BP suboptimally controlled today.  We have agreed to add losartan 25 mg daily to his regimen of metoprolol and amlodipine.  We will check a BMP today as well as in ~2 weeks.  Dyspnea on exertion: Stable from prior visits with echo in 03/2020 showing normal LVEF but grade 1 diastolic dysfunction with elevated filling pressures.  RV systolic function was also mildly reduced.  We will move forward with MPI and have a low threshold for EP referral, as frequent  PVC's could be contributing to DOE.  Shared Decision Making/Informed Consent The risks [chest pain, shortness of breath, cardiac arrhythmias, dizziness, blood pressure fluctuations, myocardial infarction, stroke/transient ischemic attack, nausea, vomiting, allergic reaction, radiation exposure, metallic taste sensation and life-threatening complications (estimated to be 1 in 10,000)], benefits (risk stratification, diagnosing coronary artery disease, treatment guidance) and alternatives of a nuclear stress test were discussed in detail with Joshua Sandoval and he agrees to proceed.   Follow-up: Return to clinic in 6 weeks.  Nelva Bush, MD 01/06/2021 11:15 AM

## 2021-01-06 NOTE — Patient Instructions (Signed)
Medication Instructions:   Your physician has recommended you make the following change in your medication:   START Losartan 25 mg daily   *If you need a refill on your cardiac medications before your next appointment, please call your pharmacy*   Lab Work:  1) TODAY: BMET, Magnesium, TSH  2) Your physician recommends that you return for lab work (same day as Art therapist) to our office.     -  This will be scheduled in our office: BMET   If you have labs (blood work) drawn today and your tests are completely normal, you will receive your results only by: MyChart Message (if you have MyChart) OR A paper copy in the mail If you have any lab test that is abnormal or we need to change your treatment, we will call you to review the results.   Testing/Procedures:  [facility] MYOVIEW  Your caregiver has ordered a Chartered certified accountant) Stress Test with nuclear imaging. The purpose of this test is to evaluate the blood supply to your heart muscle. This procedure is referred to as a "Non-Invasive Stress Test." This is because other than having an IV started in your vein, nothing is inserted or "invades" your body. Cardiac stress tests are done to find areas of poor blood flow to the heart by determining the extent of coronary artery disease (CAD). Some patients exercise on a treadmill, which naturally increases the blood flow to your heart, while others who are  unable to walk on a treadmill due to physical limitations have a pharmacologic/chemical stress agent called Lexiscan . This medicine will mimic walking on a treadmill by temporarily increasing your coronary blood flow.   Please note: these test may take anywhere between 2-4 hours to complete  PLEASE REPORT TO [facility]ICAL [facility] ENTRANCE  THE VOLUNTEERS AT THE FIRST DESK WILL DIRECT YOU WHERE TO GO  Date of Procedure:_____________________________________  Arrival Time for Procedure:______________________________  Instructions  regarding medication:   ____ : Hold diabetes medication morning of procedure  __X__:  Hold betablocker(s) night before procedure and morning of procedure - Metoprolol    PLEASE NOTIFY THE OFFICE AT LEAST 24 HOURS IN ADVANCE IF YOU ARE UNABLE TO KEEP YOUR APPOINTMENT.  [phone] AND  PLEASE NOTIFY NUCLEAR MEDICINE AT [facility] AT LEAST 24 HOURS IN ADVANCE IF YOU ARE UNABLE TO KEEP YOUR APPOINTMENT. [phone]  How to prepare for your Myoview test:  Do not eat or drink after midnight No caffeine for 24 hours prior to test No smoking 24 hours prior to test. Your medication may be taken with water.  If your doctor stopped a medication because of this test, do not take that medication. Please wear a short sleeve shirt. No perfume, cologne or lotion. Wear comfortable walking shoes.    Follow-Up: At [facility], you and your health needs are our priority.  As part of our continuing mission to provide you with exceptional heart care, we have created designated Provider Care Teams.  These Care Teams include your primary Cardiologist (physician) and Advanced Practice Providers (APPs -  Physician Assistants and Nurse Practitioners) who all work together to provide you with the care you need, when you need it.  We recommend signing up for the patient portal called "MyChart".  Sign up information is provided on this After Visit Summary.  MyChart is used to connect with patients for Virtual Visits (Telemedicine).  Patients are able to view lab/test results, encounter notes, upcoming appointments, etc.  Non-urgent messages can be sent  to your provider as well.   To learn more about what you can do with MyChart, go to [email or web address].    Your next appointment:   6 week(s)  The format for your next appointment:   In Person  Provider:   You may see Yvonne Kendall, MD or one of the following Advanced Practice Providers on your designated Care Team:   Nicolasa Ducking, NP Eula Listen,  PA-C Marisue Ivan, PA-C [place], [place]

## 2021-01-07 LAB — BASIC METABOLIC PANEL
BUN/Creatinine Ratio: 15 (ref 10–24)
BUN: 16 mg/dL (ref 8–27)
CO2: 20 mmol/L (ref 20–29)
Calcium: 9.4 mg/dL (ref 8.6–10.2)
Chloride: 100 mmol/L (ref 96–106)
Creatinine, Ser: 1.04 mg/dL (ref 0.76–1.27)
Glucose: 97 mg/dL (ref 65–99)
Potassium: 4.4 mmol/L (ref 3.5–5.2)
Sodium: 137 mmol/L (ref 134–144)
eGFR: 70 mL/min/{1.73_m2} (ref >59)

## 2021-01-07 LAB — TSH: TSH: 1.55 u[IU]/mL (ref 0.450–4.500)

## 2021-01-07 LAB — CBC

## 2021-01-08 ENCOUNTER — Encounter: Payer: Self-pay | Admitting: Internal Medicine

## 2021-01-08 ENCOUNTER — Telehealth: Payer: Self-pay | Admitting: *Deleted

## 2021-01-08 DIAGNOSIS — I493 Ventricular premature depolarization: Secondary | ICD-10-CM

## 2021-01-08 DIAGNOSIS — R0609 Other forms of dyspnea: Secondary | ICD-10-CM

## 2021-01-08 NOTE — Telephone Encounter (Signed)
Attempted to call pt. No answer. Lmtcb.  Lab orders placed to be completed at lab visit scheduled 01/20/21: BMET, CBC, Magnesium

## 2021-01-08 NOTE — Telephone Encounter (Signed)
-----   Message from Yvonne Kendall, MD sent at 01/08/2021  1:55 PM EDT ----- Kidney function, electrolytes, and thyroid function are normal.  Unfortunately, it does not appear that a CBC could be run.  Joshua Sandoval should continue his current medications.  BMP should be drawn in approximately 2 weeks when Joshua Sandoval returns for his Myoview.  Can we also check a magnesium level and CBC at that time?

## 2021-01-15 ENCOUNTER — Encounter: Payer: Self-pay | Admitting: *Deleted

## 2021-01-15 NOTE — Telephone Encounter (Signed)
Opened in error

## 2021-01-15 NOTE — Telephone Encounter (Signed)
Attempted to reschedule. Lmov .  Cancelled appt to avoid dose fee

## 2021-01-15 NOTE — Telephone Encounter (Signed)
Spoke with pt. Notified of results and Dr. Serita Kyle recc.  Pt aware that we will also check CBC and Mag with repeat BMET scheduled 01/20/21.   Pt proceeded to tell me that he needs to r/s Lexi Myoview and lab appt scheduled 9/7.  Notified pt our scheduling will call him to r/s these two appointments.  Pt has no further questions at this time.

## 2021-01-20 ENCOUNTER — Other Ambulatory Visit: Payer: Medicare PPO

## 2021-02-17 NOTE — Progress Notes (Deleted)
Follow-up Outpatient Visit Date: 02/18/2021  Primary Care Provider: Burnard Hawthorne, FNP [address] [place] [address]  Chief Complaint: ***  HPI:  Joshua Sandoval is a 87 y.o. male with history of hypertension, frequent PVCs, and right bundle branch block, who presents for follow-up of PVCs and hypertension.  I last saw him in late August, at which time Joshua Sandoval was feeling well other than mild intermittent shortness of breath and ill-defined abnormal feeling in his chest.  Frequent PVCs were again noted on his EKG.  We agreed to move forward with repeat MPI to exclude new ischemia contributing to his PVCs and aforementioned symptoms (he has not moved forward with this).  Losartan was also added given suboptimal blood pressure control.  --------------------------------------------------------------------------------------------------  Cardiovascular History & Procedures: Cardiovascular Problems: Hypertension Frequent PVC's RBBB   Risk Factors: Hypertension, male gender, and age greater than 60   Cath/PCI: None   CV Surgery: None   EP Procedures and Devices: Holter monitor (02/21/18): Predominantly sinus rhythm with frequent PVC's (15% burden), rare PAC's, and brief atrial runs.   Non-Invasive Evaluation(s): Echo (03/17/2020): Normal LV size with mild asymmetric hypertrophy of the basal septum.  LVEF 55-60% with grade 1 diastolic dysfunction and elevated filling pressure.  Normal RV size with mildly reduced function.  Normal biatrial size.  Mild MAC with trivial mitral regurgitation.  Trivial tricuspid regurgitation.  Aortic sclerosis without stenosis.  Normal CVP. Echo (01/01/18): Normal LV size with LVEF 50-55% and grade 1 diastolic dysfunction.  Normal RV size and function.  Normal PA pressure.  Frequent PVC's noted during study. Exercise MPI (02/06/18): Low risk study without ischemia or scar.  Normal LVEF.  Poor exercise capacity.  Recent CV Pertinent Labs: Lab  Results  Component Value Date   CHOL 224 (H) 01/29/2018   HDL 59 01/29/2018   LDLCALC 146 (H) 01/29/2018   LDLDIRECT 150 (H) 01/29/2018   TRIG 93 01/29/2018   CHOLHDL 3.8 01/29/2018   K 4.4 01/06/2021   MG 2.0 01/29/2018   BUN 16 01/06/2021   CREATININE 1.04 01/06/2021    Past medical and surgical history were reviewed and updated in EPIC.  No outpatient medications have been marked as taking for the 02/18/21 encounter (Appointment) with Nicolemarie Wooley, Joshua Gave, MD.    Allergies: Other  Social History   Tobacco Use   Smoking status: Former    Types: Cigarettes    Quit date: 1980    Years since quitting: 42.7   Smokeless tobacco: Never  Substance Use Topics   Alcohol use: Not Currently   Drug use: No    Family History  Problem Relation Age of Onset   Hypertension Mother    Stroke Mother     Review of Systems: A 12-system review of systems was performed and was negative except as noted in the HPI.  --------------------------------------------------------------------------------------------------  Physical Exam: There were no vitals taken for this visit.  General:  NAD. Neck: No JVD or HJR. Lungs: Clear to auscultation bilaterally without wheezes or crackles. Heart: Regular rate and rhythm without murmurs, rubs, or gallops. Abdomen: Soft, nontender, nondistended. Extremities: No lower extremity edema.  EKG:  ***  Lab Results  Component Value Date   WBC CANCELED 01/06/2021   HGB CANCELED 01/06/2021   HCT CANCELED 01/06/2021   MCV 83 02/26/2020   PLT CANCELED 01/06/2021    Lab Results  Component Value Date   NA 137 01/06/2021   K 4.4 01/06/2021   CL 100 01/06/2021  CO2 20 01/06/2021   BUN 16 01/06/2021   CREATININE 1.04 01/06/2021   GLUCOSE 97 01/06/2021   ALT 11 02/26/2020    Lab Results  Component Value Date   CHOL 224 (H) 01/29/2018   HDL 59 01/29/2018   LDLCALC 146 (H) 01/29/2018   LDLDIRECT 150 (H) 01/29/2018   TRIG 93 01/29/2018   CHOLHDL  3.8 01/29/2018    --------------------------------------------------------------------------------------------------  ASSESSMENT AND PLAN: Joshua Gave Melven Stockard, MD 02/17/2021 8:29 AM

## 2021-02-18 ENCOUNTER — Ambulatory Visit: Payer: Medicare PPO | Admitting: Internal Medicine

## 2021-03-01 ENCOUNTER — Ambulatory Visit (INDEPENDENT_AMBULATORY_CARE_PROVIDER_SITE_OTHER): Payer: Medicare PPO

## 2021-03-01 VITALS — Ht 66.0 in | Wt 160.0 lb

## 2021-03-01 DIAGNOSIS — Z0000 Encounter for general adult medical examination without abnormal findings: Secondary | ICD-10-CM | POA: Diagnosis not present

## 2021-03-01 NOTE — Patient Instructions (Addendum)
[name] , Thank you for taking time to come for your Medicare Wellness Visit. I appreciate your ongoing commitment to your health goals. Please review the following plan we discussed and let me know if I can assist you in the future.   These are the goals we discussed:  Goals       Patient Stated     Increase physical activity (pt-stated)      I want to start lifting weights again         This is a list of the screening recommended for you and due dates:  Health Maintenance  Topic Date Due   COVID-19 Vaccine (3 - Booster for Moderna series) 03/17/2021*   Zoster (Shingles) Vaccine (1 of 2) 06/01/2021*   Tetanus Vaccine  03/01/2022*   Flu Shot  Completed   HPV Vaccine  Aged Out  *Topic was postponed. The date shown is not the original due date.    Advanced directives: not yet completed  Conditions/risks identified: none new  Follow up in one year for your annual wellness visit.   Preventive Care 59 Years and Older, Male Preventive care refers to lifestyle choices and visits with your health care provider that can promote health and wellness. What does preventive care include? A yearly physical exam. This is also called an annual well check. Dental exams once or twice a year. Routine eye exams. Ask your health care provider how often you should have your eyes checked. Personal lifestyle choices, including: Daily care of your teeth and gums. Regular physical activity. Eating a healthy diet. Avoiding tobacco and drug use. Limiting alcohol use. Practicing safe sex. Taking low doses of aspirin every day. Taking vitamin and mineral supplements as recommended by your health care provider. What happens during an annual well check? The services and screenings done by your health care provider during your annual well check will depend on your age, overall health, lifestyle risk factors, and family history of disease. Counseling  Your health care provider may ask you questions  about your: Alcohol use. Tobacco use. Drug use. Emotional well-being. Home and relationship well-being. Sexual activity. Eating habits. History of falls. Memory and ability to understand (cognition). Work and work Astronomer. Screening  You may have the following tests or measurements: Height, weight, and BMI. Blood pressure. Lipid and cholesterol levels. These may be checked every 5 years, or more frequently if you are over 1 years old. Skin check. Lung cancer screening. You may have this screening every year starting at age 22 if you have a 30-pack-year history of smoking and currently smoke or have quit within the past 15 years. Fecal occult blood test (FOBT) of the stool. You may have this test every year starting at age 84. Flexible sigmoidoscopy or colonoscopy. You may have a sigmoidoscopy every 5 years or a colonoscopy every 10 years starting at age 44. Prostate cancer screening. Recommendations will vary depending on your family history and other risks. Hepatitis C blood test. Hepatitis B blood test. Sexually transmitted disease (STD) testing. Diabetes screening. This is done by checking your blood sugar (glucose) after you have not eaten for a while (fasting). You may have this done every 1-3 years. Abdominal aortic aneurysm (AAA) screening. You may need this if you are a current or former smoker. Osteoporosis. You may be screened starting at age 77 if you are at high risk. Talk with your health care provider about your test results, treatment options, and if necessary, the need for more tests.  Vaccines  Your health care provider may recommend certain vaccines, such as: Influenza vaccine. This is recommended every year. Tetanus, diphtheria, and acellular pertussis (Tdap, Td) vaccine. You may need a Td booster every 10 years. Zoster vaccine. You may need this after age 77. Pneumococcal 13-valent conjugate (PCV13) vaccine. One dose is recommended after age 22. Pneumococcal  polysaccharide (PPSV23) vaccine. One dose is recommended after age 66. Talk to your health care provider about which screenings and vaccines you need and how often you need them. This information is not intended to replace advice given to you by your health care provider. Make sure you discuss any questions you have with your health care provider. Document Released: 05/29/2015 Document Revised: 01/20/2016 Document Reviewed: 03/03/2015 Elsevier Interactive Patient Education  2017 [employer] Prevention in the Home Falls can cause injuries. They can happen to people of all ages. There are many things you can do to make your home safe and to help prevent falls. What can I do on the outside of my home? Regularly fix the edges of walkways and driveways and fix any cracks. Remove anything that might make you trip as you walk through a door, such as a raised step or threshold. Trim any bushes or trees on the path to your home. Use bright outdoor lighting. Clear any walking paths of anything that might make someone trip, such as rocks or tools. Regularly check to see if handrails are loose or broken. Make sure that both sides of any steps have handrails. Any raised decks and porches should have guardrails on the edges. Have any leaves, snow, or ice cleared regularly. Use sand or salt on walking paths during winter. Clean up any spills in your garage right away. This includes oil or grease spills. What can I do in the bathroom? Use night lights. Install grab bars by the toilet and in the tub and shower. Do not use towel bars as grab bars. Use non-skid mats or decals in the tub or shower. If you need to sit down in the shower, use a plastic, non-slip stool. Keep the floor dry. Clean up any water that spills on the floor as soon as it happens. Remove soap buildup in the tub or shower regularly. Attach bath mats securely with double-sided non-slip rug tape. Do not have throw rugs and other  things on the floor that can make you trip. What can I do in the bedroom? Use night lights. Make sure that you have a light by your bed that is easy to reach. Do not use any sheets or blankets that are too big for your bed. They should not hang down onto the floor. Have a firm chair that has side arms. You can use this for support while you get dressed. Do not have throw rugs and other things on the floor that can make you trip. What can I do in the kitchen? Clean up any spills right away. Avoid walking on wet floors. Keep items that you use a lot in easy-to-reach places. If you need to reach something above you, use a strong step stool that has a grab bar. Keep electrical cords out of the way. Do not use floor polish or wax that makes floors slippery. If you must use wax, use non-skid floor wax. Do not have throw rugs and other things on the floor that can make you trip. What can I do with my stairs? Do not leave any items on the stairs. Make sure that  there are handrails on both sides of the stairs and use them. Fix handrails that are broken or loose. Make sure that handrails are as long as the stairways. Check any carpeting to make sure that it is firmly attached to the stairs. Fix any carpet that is loose or worn. Avoid having throw rugs at the top or bottom of the stairs. If you do have throw rugs, attach them to the floor with carpet tape. Make sure that you have a light switch at the top of the stairs and the bottom of the stairs. If you do not have them, ask someone to add them for you. What else can I do to help prevent falls? Wear shoes that: Do not have high heels. Have rubber bottoms. Are comfortable and fit you well. Are closed at the toe. Do not wear sandals. If you use a stepladder: Make sure that it is fully opened. Do not climb a closed stepladder. Make sure that both sides of the stepladder are locked into place. Ask someone to hold it for you, if possible. Clearly  mark and make sure that you can see: Any grab bars or handrails. First and last steps. Where the edge of each step is. Use tools that help you move around (mobility aids) if they are needed. These include: Canes. Walkers. Scooters. Crutches. Turn on the lights when you go into a dark area. Replace any light bulbs as soon as they burn out. Set up your furniture so you have a clear path. Avoid moving your furniture around. If any of your floors are uneven, fix them. If there are any pets around you, be aware of where they are. Review your medicines with your doctor. Some medicines can make you feel dizzy. This can increase your chance of falling. Ask your doctor what other things that you can do to help prevent falls. This information is not intended to replace advice given to you by your health care provider. Make sure you discuss any questions you have with your health care provider. Document Released: 02/26/2009 Document Revised: 10/08/2015 Document Reviewed: 06/06/2014 Elsevier Interactive Patient Education  2017 [employer].

## 2021-03-01 NOTE — Progress Notes (Signed)
Subjective:   Joshua Sandoval is a 87 y.o. male who presents for Medicare Annual/Subsequent preventive examination.  Review of Systems    No ROS.  Medicare Wellness Virtual Visit.  Visual/audio telehealth visit, UTA vital signs.   See social history for additional risk factors.   Cardiac Risk Factors include: advanced age (>6men, >82 women);male gender     Objective:    Today's Vitals   03/01/21 1324  Weight: 160 lb (72.6 kg)  Height: 5\' 6"  (1.676 m)   Body mass index is 25.82 kg/m.  Advanced Directives 03/01/2021 02/27/2020 02/26/2019 02/23/2018  Does Patient Have a Medical Advance Directive? No No No No  Would patient like information on creating a medical advance directive? No - Patient declined No - Patient declined No - Patient declined -    Current Medications (verified) Outpatient Encounter Medications as of 03/01/2021  Medication Sig   amLODipine (NORVASC) 10 MG tablet TAKE 1 TABLET(10 MG) BY MOUTH DAILY   aspirin EC 81 MG tablet Take 81 mg by mouth as needed.    losartan (COZAAR) 25 MG tablet Take 1 tablet (25 mg total) by mouth daily.   metoprolol succinate (TOPROL-XL) 50 MG 24 hr tablet TAKE 1 TABLET(50 MG) BY MOUTH DAILY WITH OR IMMEDIATELY FOLLOWING A MEAL   No facility-administered encounter medications on file as of 03/01/2021.    Allergies (verified) Other   History: Past Medical History:  Diagnosis Date   Diastolic dysfunction    a. 12/2017 Echo: EF 50-55%, no rwma, Gr1 DD, nl RV fxn; b. 03/2020 Echo: EF 55-60%, no rwma, Gr1 DD, Mildly red RV fxn, triv MR, mild to mod AoV sclerosis w/o stenosis.   History of stress test    a. 01/2018 MV: EF 55%. No ischemia/infarct-->Low risk.   Hypertension    PVC's (premature ventricular contractions)    RBBB    Past Surgical History:  Procedure Laterality Date   NO PAST SURGERIES     Family History  Problem Relation Age of Onset   Hypertension Mother    Stroke Mother    Social History   Socioeconomic  History   Marital status: Married    Spouse name: Not on file   Number of children: Not on file   Years of education: Not on file   Highest education level: Not on file  Occupational History   Not on file  Tobacco Use   Smoking status: Former    Types: Cigarettes    Quit date: 1980    Years since quitting: 42.8   Smokeless tobacco: Never  Substance and Sexual Activity   Alcohol use: Not Currently   Drug use: No   Sexual activity: Yes    Partners: Female  Other Topics Concern   Not on file  Social History Narrative   Not on file   Social Determinants of Health   Financial Resource Strain: Low Risk    Difficulty of Paying Living Expenses: Not hard at all  Food Insecurity: No Food Insecurity   Worried About 02/2018 in the Last Year: Never true   Programme researcher, broadcasting/film/video in the Last Year: Never true  Transportation Needs: No Transportation Needs   Lack of Transportation (Medical): No   Lack of Transportation (Non-Medical): No  Physical Activity: Insufficiently Active   Days of Exercise per Week: 2 days   Minutes of Exercise per Session: 50 min  Stress: No Stress Concern Present   Feeling of Stress : Not at all  Social Connections: Unknown   Frequency of Communication with Friends and Family: More than three times a week   Frequency of Social Gatherings with Friends and Family: More than three times a week   Attends Religious Services: Not on Scientist, clinical (histocompatibility and immunogenetics) or Organizations: Not on file   Attends Banker Meetings: Not on file   Marital Status: Not on file    Tobacco Counseling Counseling given: Not Answered   Clinical Intake:  Pre-visit preparation completed: Yes        Diabetes: No  How often do you need to have someone help you when you read instructions, pamphlets, or other written materials from your doctor or pharmacy?: 1 - Never   Interpreter Needed?: No      Activities of Daily Living In your present state of  health, do you have any difficulty performing the following activities: 03/01/2021  Hearing? N  Vision? N  Difficulty concentrating or making decisions? N  Walking or climbing stairs? N  Dressing or bathing? N  Doing errands, shopping? N  Preparing Food and eating ? N  Using the Toilet? N  In the past six months, have you accidently leaked urine? N  Do you have problems with loss of bowel control? N  Managing your Medications? N  Managing your Finances? N  Housekeeping or managing your Housekeeping? N  Some recent data might be hidden    Patient Care Team: Allegra Grana, FNP as PCP - General (Family Medicine) End, Cristal Deer, MD as PCP - Cardiology (Cardiology)  Indicate any recent Medical Services you may have received from other than [facility] providers in the past year (date may be approximate).     Assessment:   This is a routine wellness examination for Joshua Sandoval.  I connected with Joshua Sandoval today by telephone and verified that I am speaking with the correct person using two identifiers. Location patient: home Location provider: work Persons participating in the virtual visit: patient, Engineer, civil (consulting).    I discussed the limitations, risks, security and privacy concerns of performing an evaluation and management service by telephone and the availability of in person appointments. The patient expressed understanding and verbally consented to this telephonic visit.    Interactive audio and video telecommunications were attempted between this provider and patient, however failed, due to patient having technical difficulties OR patient did not have access to video capability.  We continued and completed visit with audio only.  Some vital signs may be absent or patient reported.   Hearing/Vision screen Hearing Screening - Comments:: Patient is able to hear conversational tones without difficulty. No issues reported Vision Screening - Comments:: Followed by [facility] Wears corrective  lenses They have seen their ophthalmologist in the last 12 months.    Dietary issues and exercise activities discussed: Current Exercise Habits: Home exercise routine, Type of exercise: strength training/weights, Time (Minutes): 45, Frequency (Times/Week): 2, Weekly Exercise (Minutes/Week): 90, Intensity: Mild   Goals Addressed               This Visit's Progress     Patient Stated     Increase physical activity (pt-stated)   On track     I want to start lifting weights again        Depression Screen PHQ 2/9 Scores 03/01/2021 02/27/2020 02/26/2019 09/14/2016  PHQ - 2 Score 0 0 0 0  PHQ- 9 Score - - - 0    Fall Risk Fall Risk  03/01/2021 02/27/2020 02/26/2019  09/14/2016  Falls in the past year? 0 0 0 No  Number falls in past yr: 0 0 - -  Follow up Falls evaluation completed Falls evaluation completed - -    FALL RISK PREVENTION PERTAINING TO THE HOME: Adequate lighting in your home to reduce risk of falls? Yes   ASSISTIVE DEVICES UTILIZED TO PREVENT FALLS: Life alert? No  Use of a cane, walker or w/c? No   TIMED UP AND GO: Was the test performed? No .   Cognitive Function:     6CIT Screen 03/01/2021 02/26/2019  What Year? 0 points 0 points  What month? 0 points 0 points  What time? 0 points 0 points  Count back from 20 - 0 points  Months in reverse - 0 points  Repeat phrase - 2 points  Total Score - 2    Immunizations Immunization History  Administered Date(s) Administered   Fluad Quad(high Dose 65+) 02/27/2019   Influenza, High Dose Seasonal PF 02/15/2021   Influenza,inj,Quad PF,6+ Mos 03/07/2018   Moderna Sars-Covid-2 Vaccination 06/24/2019, 07/24/2019   Pneumococcal Conjugate-13 01/01/2019    TDAP status: Due, Education has been provided regarding the importance of this vaccine. Advised may receive this vaccine at local pharmacy or Health Dept. Aware to provide a copy of the vaccination record if obtained from local pharmacy or Health Dept. Verbalized  acceptance and understanding. Deferred.    Shingrix Completed?: No.    Education has been provided regarding the importance of this vaccine. Patient has been advised to call insurance company to determine out of pocket expense if they have not yet received this vaccine. Advised may also receive vaccine at local pharmacy or Health Dept. Verbalized acceptance and understanding.  Screening Tests Health Maintenance  Topic Date Due   COVID-19 Vaccine (3 - Booster for Moderna series) 03/17/2021 (Originally 12/24/2019)   Zoster Vaccines- Shingrix (1 of 2) 06/01/2021 (Originally 10/21/1984)   TETANUS/TDAP  03/01/2022 (Originally 10/21/1951)   INFLUENZA VACCINE  Completed   HPV VACCINES  Aged Out    Health Maintenance  There are no preventive care reminders to display for this patient.  Lung Cancer Screening: (Low Dose CT Chest recommended if Age 56-80 years, 30 pack-year currently smoking OR have quit w/in 15years.) does not qualify.   Hepatitis C Screening: does not qualify  Vision Screening: Recommended annual ophthalmology exams for early detection of glaucoma and other disorders of the eye.  Dental Screening: Recommended annual dental exams for proper oral hygiene.  Community Resource Referral / Chronic Care Management: CRR required this visit?  No   CCM required this visit?  No      Plan:   Keep all routine maintenance appointments.   I have personally reviewed and noted the following in the patient's chart:   Medical and social history Use of alcohol, tobacco or illicit drugs  Current medications and supplements including opioid prescriptions. Patient is not currently taking opioid prescriptions. Functional ability and status Nutritional status Physical activity Advanced directives List of other physicians Hospitalizations, surgeries, and ER visits in previous 12 months Vitals Screenings to include cognitive, depression, and falls Referrals and appointments  In addition, I  have reviewed and discussed with patient certain preventive protocols, quality metrics, and best practice recommendations. A written personalized care plan for preventive services as well as general preventive health recommendations were provided to patient.     Ashok Pall, LPN   [id number]

## 2021-03-06 ENCOUNTER — Other Ambulatory Visit: Payer: Self-pay | Admitting: Internal Medicine

## 2021-03-08 NOTE — Telephone Encounter (Signed)
Please contact pt for future appointment. Pt overdue for 6 wk f/u.

## 2021-03-23 ENCOUNTER — Encounter: Payer: Self-pay | Admitting: Medical

## 2021-03-23 ENCOUNTER — Ambulatory Visit: Payer: Medicare PPO | Admitting: Medical

## 2021-03-23 ENCOUNTER — Other Ambulatory Visit: Payer: Self-pay

## 2021-03-23 VITALS — BP 134/60 | HR 73 | Ht 67.0 in | Wt 165.1 lb

## 2021-03-23 DIAGNOSIS — I493 Ventricular premature depolarization: Secondary | ICD-10-CM | POA: Diagnosis not present

## 2021-03-23 DIAGNOSIS — Z01812 Encounter for preprocedural laboratory examination: Secondary | ICD-10-CM

## 2021-03-23 DIAGNOSIS — R0609 Other forms of dyspnea: Secondary | ICD-10-CM

## 2021-03-23 DIAGNOSIS — I10 Essential (primary) hypertension: Secondary | ICD-10-CM

## 2021-03-23 MED ORDER — METOPROLOL TARTRATE 100 MG PO TABS: ORAL_TABLET | ORAL | 0 refills | Status: DC

## 2021-03-23 NOTE — Progress Notes (Signed)
Cardiology Office Note:    Date:  03/23/2021   ID:  Joshua Sandoval, DOB 05-Feb-1933, MRN [id number]  PCP:  Allegra Grana, FNP  [facility] Cardiologist:  Yvonne Kendall, MD  [facility] Electrophysiologist:  None   Referring MD: Allegra Grana, FNP   Chief Complaint: 6 week follow-up labs  History of Present Illness:    Joshua Sandoval is a 87 y.o. male with a hx of HTN, frequent PVCs, RBBB who presents for follow-up. Echo 03/2020 showed normal LVEF with G1DD and elevated filling pressures, moderately reduced RV Function was noted, no significant valvular abnormality. Myocardial perfusion stress test was recommended, but not performed.   Last seen 12/2019 and was doing fairly well. Stress test ordered for freqeunt PVCs, however was not performed.   Today, the patient decided he didn't want to do the stress test since he is 55 and works every day. He does school crossing. He feels at his age he does not need the stress test. We discussed the stress test and reason for stress test in detail. Discussed also Cardiac CT, which is  more willing to do. He denies chest pain, DOE, LLE, orthopnea, pnd, orthopnea.    Past Medical History:  Diagnosis Date   Diastolic dysfunction    a. 12/2017 Echo: EF 50-55%, no rwma, Gr1 DD, nl RV fxn; b. 03/2020 Echo: EF 55-60%, no rwma, Gr1 DD, Mildly red RV fxn, triv MR, mild to mod AoV sclerosis w/o stenosis.   History of stress test    a. 01/2018 MV: EF 55%. No ischemia/infarct-->Low risk.   Hypertension    PVC's (premature ventricular contractions)    RBBB     Past Surgical History:  Procedure Laterality Date   NO PAST SURGERIES      Current Medications: Current Meds  Medication Sig   amLODipine (NORVASC) 10 MG tablet TAKE 1 TABLET(10 MG) BY MOUTH DAILY   aspirin EC 81 MG tablet Take 81 mg by mouth as needed.    losartan (COZAAR) 25 MG tablet Take 1 tablet (25 mg total) by mouth daily.   metoprolol succinate (TOPROL-XL) 50 MG 24 hr tablet TAKE  1 TABLET(50 MG) BY MOUTH DAILY WITH OR IMMEDIATELY FOLLOWING A MEAL     Allergies:   Other   Social History   Socioeconomic History   Marital status: Married    Spouse name: Not on file   Number of children: Not on file   Years of education: Not on file   Highest education level: Not on file  Occupational History   Not on file  Tobacco Use   Smoking status: Former    Types: Cigarettes    Quit date: 1980    Years since quitting: 42.8   Smokeless tobacco: Never  Substance and Sexual Activity   Alcohol use: Not Currently   Drug use: No   Sexual activity: Yes    Partners: Female  Other Topics Concern   Not on file  Social History Narrative   Not on file   Social Determinants of Health   Financial Resource Strain: Low Risk    Difficulty of Paying Living Expenses: Not hard at all  Food Insecurity: No Food Insecurity   Worried About Programme researcher, broadcasting/film/video in the Last Year: Never true   Barista in the Last Year: Never true  Transportation Needs: No Transportation Needs   Lack of Transportation (Medical): No   Lack of Transportation (Non-Medical): No  Physical Activity: Insufficiently Active  Days of Exercise per Week: 2 days   Minutes of Exercise per Session: 50 min  Stress: No Stress Concern Present   Feeling of Stress : Not at all  Social Connections: Unknown   Frequency of Communication with Friends and Family: More than three times a week   Frequency of Social Gatherings with Friends and Family: More than three times a week   Attends Religious Services: Not on Scientist, clinical (histocompatibility and immunogenetics) or Organizations: Not on file   Attends Banker Meetings: Not on file   Marital Status: Not on file     Family History: The patient's family history includes Hypertension in his mother; Stroke in his mother.  ROS:   Please see the history of present illness.     All other systems reviewed and are negative.  EKGs/Labs/Other Studies Reviewed:    The  following studies were reviewed today:  Echo 03/2020  1. Left ventricular ejection fraction, by estimation, is 55 to 60%. The  left ventricle has normal function. The left ventricle has no regional  wall motion abnormalities. There is mild asymmetric left ventricular  hypertrophy of the basal-septal segment.  Left ventricular diastolic parameters are consistent with Grade I  diastolic dysfunction (impaired relaxation). Elevated left atrial  pressure.   2. Right ventricular systolic function is mildly reduced. The right  ventricular size is normal. Tricuspid regurgitation signal is inadequate  for assessing PA pressure.   3. The mitral valve is normal in structure. Trivial mitral valve  regurgitation. No evidence of mitral stenosis.   4. The aortic valve is tricuspid. There is mild thickening of the aortic  valve. Aortic valve regurgitation is not visualized. Mild to moderate  aortic valve sclerosis/calcification is present, without any evidence of  aortic stenosis.   5. The inferior vena cava is normal in size with greater than 50%  respiratory variability, suggesting right atrial pressure of 3 mmHg.   EKG:  EKG is not ordered today.    Recent Labs: 01/06/2021: BUN 16; Creatinine, Ser 1.04; Hemoglobin CANCELED; Platelets CANCELED; Potassium 4.4; Sodium 137; TSH 1.550  Recent Lipid Panel    Component Value Date/Time   CHOL 224 (H) 01/29/2018 1503   TRIG 93 01/29/2018 1503   HDL 59 01/29/2018 1503   CHOLHDL 3.8 01/29/2018 1503   LDLCALC 146 (H) 01/29/2018 1503   LDLDIRECT 150 (H) 01/29/2018 1503    Physical Exam:    VS:  BP 134/60 (BP Location: Left Arm, Patient Position: Sitting, Cuff Size: Normal)   Pulse 73   Ht 5\' 7"  (1.702 m)   Wt 165 lb 2 oz (74.9 kg)   SpO2 98%   BMI 25.86 kg/m     Wt Readings from Last 3 Encounters:  03/23/21 165 lb 2 oz (74.9 kg)  03/01/21 160 lb (72.6 kg)  01/06/21 160 lb 4 oz (72.7 kg)     GEN:  Well nourished, well developed in no acute  distress HEENT: Normal NECK: No JVD; No carotid bruits LYMPHATICS: No lymphadenopathy CARDIAC: RRR, no murmurs, rubs, gallops RESPIRATORY:  Clear to auscultation without rales, wheezing or rhonchi  ABDOMEN: Soft, non-tender, non-distended MUSCULOSKELETAL:  No edema; No deformity  SKIN: Warm and dry NEUROLOGIC:  Alert and oriented x 3 PSYCHIATRIC:  Normal affect   ASSESSMENT:    1. Dyspnea on exertion   2. Frequent PVCs   3. Essential hypertension    PLAN:    In order of problems listed above:  Frequent PVCs Noted  on prior EKGs, patient was set up for a stress test twice, but canceled since he felt like he didn't need it. Long discussion around reason for further ischemic testing, he eventually agreed to pursue Cardiac CTA. Will order this and see him back after testing. Denies chest pain or significant SOB.   HTN BP good today. Continue Amlodipine and Losartan.  DOE Patient denies significant DOE. Prior echo 03/2020 showed LVEF 55-60%, no WMA, mild LVH, G1DD, RV function is mildly reduced, trivial MR, mild to mod AV sclerosis.  Disposition: Follow up in 2 month(s) with MD/APP    Signed, Jatavius Ellenwood David Stall, PA-C  03/23/2021 10:35 AM    [facility]

## 2021-03-23 NOTE — Patient Instructions (Signed)
Medication Instructions:  - Your physician recommends that you continue on your current medications as directed. Please refer to the Current Medication list given to you today.  *If you need a refill on your cardiac medications before your next appointment, please call your pharmacy*   Lab Work: - Your physician recommends that you have lab work today: BMP  If you have labs (blood work) drawn today and your tests are completely normal, you will receive your results only by: MyChart Message (if you have MyChart) OR A paper copy in the mail If you have any lab test that is abnormal or we need to change your treatment, we will call you to review the results.   Testing/Procedures:  1) Cardiac CT angiogram:  - Your physician has requested that you have cardiac CT. Cardiac computed tomography (CT) is a painless test that uses an x-ray machine to take clear, detailed pictures of your heart.    Your cardiac CT will be scheduled at one of the below locations:   [facility] [address] [place], [place] [address] [phone]  OR  [facility] [address] [place], [place] [address] [phone]  If scheduled at [facility], please arrive at the [facility] main entrance (entrance A) of [facility] 30 minutes prior to test start time. You can use the FREE valet parking offered at the main entrance (encouraged to control the heart rate for the test) Proceed to the [facility] Radiology Department (first floor) to check-in and test prep.  If scheduled at [facility], please arrive 15 mins early for check-in and test prep.  Please follow these instructions carefully (unless otherwise directed):  Hold all erectile dysfunction medications at least 3 days (72 hrs) prior to test.  On the Night Before the Test: Be sure to Drink plenty of water. Do not consume any  caffeinated/decaffeinated beverages or chocolate 12 hours prior to your test. Do not take any antihistamines 12 hours prior to your test.   On the Day of the Test: Drink plenty of water until 1 hour prior to the test. Do not eat any food 4 hours prior to the test. You may take your regular medications prior to the test.  Take metoprolol (Lopressor) 100 mg two hours prior to test.       After the Test: Drink plenty of water. After receiving IV contrast, you may experience a mild flushed feeling. This is normal. On occasion, you may experience a mild rash up to 24 hours after the test. This is not dangerous. If this occurs, you can take Benadryl 25 mg and increase your fluid intake. If you experience trouble breathing, this can be serious. If it is severe call 911 IMMEDIATELY. If it is mild, please call our office.   Please allow 2-4 weeks for scheduling of routine cardiac CTs. Some insurance companies require a pre-authorization which may delay scheduling of this test.   For non-scheduling related questions, please contact the cardiac imaging nurse navigator should you have any questions/concerns: Rockwell Alexandria, Cardiac Imaging Nurse Navigator Larey Brick, Cardiac Imaging Nurse Navigator [facility] Direct Office Dial: [phone]   For scheduling needs, including cancellations and rescheduling, please call [place], [phone].   Follow-Up: At [facility], you and your health needs are our priority.  As part of our continuing mission to provide you with exceptional heart care, we have created designated Provider Care Teams.  These Care Teams include your primary Cardiologist (physician) and Advanced Practice Providers (APPs -  Physician Assistants and Nurse Practitioners) who all work together to provide you with the care you need, when you need it.  We recommend signing up for the patient portal called "MyChart".  Sign up information is provided on  this After Visit Summary.  MyChart is used to connect with patients for Virtual Visits (Telemedicine).  Patients are able to view lab/test results, encounter notes, upcoming appointments, etc.  Non-urgent messages can be sent to your provider as well.   To learn more about what you can do with MyChart, go to [email or web address].    Your next appointment:   2-3 month(s)  The format for your next appointment:   In Person  Provider:   You may see Yvonne Kendall, MD or one of the following Advanced Practice Providers on your designated Care Team:   Nicolasa Ducking, NP Eula Listen, PA-C [name], [place]     Other Instructions  Cardiac CT Angiogram A cardiac CT angiogram is a procedure to look at the heart and the area around the heart. It may be done to help find the cause of chest pains or other symptoms of heart disease. During this procedure, a substance called contrast dye is injected into the blood vessels in the area to be checked. A large X-ray machine, called a CT scanner, then takes detailed pictures of the heart and the surrounding area. The procedure is also sometimes called a coronary CT angiogram, coronary artery scanning, or CTA. A cardiac CT angiogram allows the health care provider to see how well blood is flowing to and from the heart. The health care provider will be able to see if there are any problems, such as: Blockage or narrowing of the coronary arteries in the heart. Fluid around the heart. Signs of weakness or disease in the muscles, valves, and tissues of the heart. Tell a health care provider about: Any allergies you have. This is especially important if you have had a previous allergic reaction to contrast dye. All medicines you are taking, including vitamins, herbs, eye drops, creams, and over-the-counter medicines. Any blood disorders you have. Any surgeries you have had. Any medical conditions you have. Whether you are pregnant or may be  pregnant. Any anxiety disorders, chronic pain, or other conditions you have that may increase your stress or prevent you from lying still. What are the risks? Generally, this is a safe procedure. However, problems may occur, including: Bleeding. Infection. Allergic reactions to medicines or dyes. Damage to other structures or organs. Kidney damage from the contrast dye that is used. Increased risk of cancer from radiation exposure. This risk is low. Talk with your health care provider about: The risks and benefits of testing. How you can receive the lowest dose of radiation. What happens before the procedure? Wear comfortable clothing and remove any jewelry, glasses, dentures, and hearing aids. Follow instructions from your health care provider about eating and drinking. This may include: For 12 hours before the procedure -- avoid caffeine. This includes tea, coffee, soda, energy drinks, and diet pills. Drink plenty of water or other fluids that do not have caffeine in them. Being well hydrated can prevent complications. For 4-6 hours before the procedure -- stop eating and drinking. The contrast dye can cause nausea, but this is less likely if your stomach is empty. Ask your health care provider about changing or stopping your regular medicines. This is especially important if you  are taking diabetes medicines, blood thinners, or medicines to treat problems with erections (erectile dysfunction). What happens during the procedure?  Hair on your chest may need to be removed so that small sticky patches called electrodes can be placed on your chest. These will transmit information that helps to monitor your heart during the procedure. An IV will be inserted into one of your veins. You might be given a medicine to control your heart rate during the procedure. This will help to ensure that good images are obtained. You will be asked to lie on an exam table. This table will slide in and out of the  CT machine during the procedure. Contrast dye will be injected into the IV. You might feel warm, or you may get a metallic taste in your mouth. You will be given a medicine called nitroglycerin. This will relax or dilate the arteries in your heart. The table that you are lying on will move into the CT machine tunnel for the scan. The person running the machine will give you instructions while the scans are being done. You may be asked to: Keep your arms above your head. Hold your breath. Stay very still, even if the table is moving. When the scanning is complete, you will be moved out of the machine. The IV will be removed. The procedure may vary among health care providers and hospitals. What can I expect after the procedure? After your procedure, it is common to have: A metallic taste in your mouth from the contrast dye. A feeling of warmth. A headache from the nitroglycerin. Follow these instructions at home: Take over-the-counter and prescription medicines only as told by your health care provider. If you are told, drink enough fluid to keep your urine pale yellow. This will help to flush the contrast dye out of your body. Most people can return to their normal activities right after the procedure. Ask your health care provider what activities are safe for you. It is up to you to get the results of your procedure. Ask your health care provider, or the department that is doing the procedure, when your results will be ready. Keep all follow-up visits as told by your health care provider. This is important. Contact a health care provider if: You have any symptoms of allergy to the contrast dye. These include: Shortness of breath. Rash or hives. A racing heartbeat. Summary A cardiac CT angiogram is a procedure to look at the heart and the area around the heart. It may be done to help find the cause of chest pains or other symptoms of heart disease. During this procedure, a large X-ray  machine, called a CT scanner, takes detailed pictures of the heart and the surrounding area after a contrast dye has been injected into blood vessels in the area. Ask your health care provider about changing or stopping your regular medicines before the procedure. This is especially important if you are taking diabetes medicines, blood thinners, or medicines to treat erectile dysfunction. If you are told, drink enough fluid to keep your urine pale yellow. This will help to flush the contrast dye out of your body. This information is not intended to replace advice given to you by your health care provider. Make sure you discuss any questions you have with your health care provider. Document Revised: 01/13/2021 Document Reviewed: 12/26/2018 Elsevier Patient Education  2022 [employer].

## 2021-03-24 ENCOUNTER — Telehealth: Payer: Self-pay | Admitting: *Deleted

## 2021-03-24 LAB — BASIC METABOLIC PANEL
BUN/Creatinine Ratio: 12 (ref 10–24)
BUN: 13 mg/dL (ref 8–27)
CO2: 20 mmol/L (ref 20–29)
Calcium: 9.8 mg/dL (ref 8.6–10.2)
Chloride: 102 mmol/L (ref 96–106)
Creatinine, Ser: 1.07 mg/dL (ref 0.76–1.27)
Glucose: 121 mg/dL — ABNORMAL HIGH (ref 70–99)
Potassium: 4.9 mmol/L (ref 3.5–5.2)
Sodium: 140 mmol/L (ref 134–144)
eGFR: 68 mL/min/{1.73_m2} (ref >59)

## 2021-03-24 NOTE — Telephone Encounter (Signed)
-----   Message from [place] sent at 03/24/2021 11:19 AM EST ----- Good Morning,   I just spoke to [place] to schedule his cardiac ct scan. He is not understanding why this is being scheduled and said no one spoke to him about this. Can you please reach out to him about this ct scan and answer all his questions and concerns.  Thanks, [place]

## 2021-03-24 NOTE — Telephone Encounter (Signed)
The patient was seen in the office yesterday with Joshua Sandoval, [place]. She reviewed the option / reason for Cardiac CT with the patient. I then reviewed the test with him again and all pre-Cardiac CTA instructions.  I did go through these instructions with the patient several times and allowed him to ask questions.  He advised he would need to go home and read through these instructions further. I asked the patient to not throw his AVS away from yesterdays visit as he would need to refer back to the instructions prior to his scan being scheduled. He was advised he would be contacted by Joshua Sandoval, Cardiac CT scheduler to arrange. He voiced understanding at the time. He was also given contact #'s for Joshua Sandoval/ Joshua Sandoval/ Joshua Sandoval for any questions regarding the scan at yesterday's visit and these were circled on his paper.  I have attempted to call the patient today to clarify the test again with him.  No answer- I left a message of the patient to call back.   Instructions per the patient's AVS from his appointment and as reviewed with him at the time of his visit listed belowed:  Testing/Procedures:   1) Cardiac CT angiogram:   - Your physician has requested that you have cardiac CT. Cardiac computed tomography (CT) is a painless test that uses an x-ray machine to take clear, detailed pictures of your heart.     Your cardiac CT will be scheduled at one of the below locations:    [facility] [address] [place], [place] [address] [phone]   OR   [facility] [address] [place], [place] [address] [phone]   If scheduled at [facility], please arrive at the [facility] main entrance (entrance A) of [facility] 30 minutes prior to test start time. You can use the FREE valet parking offered at the main entrance (encouraged to control the heart rate for the test) Proceed to the [facility] Radiology  Department (first floor) to check-in and test prep.   If scheduled at [facility], please arrive 15 mins early for check-in and test prep.   Please follow these instructions carefully (unless otherwise directed):   Hold all erectile dysfunction medications at least 3 days (72 hrs) prior to test.   On the Night Before the Test: Be sure to Drink plenty of water. Do not consume any caffeinated/decaffeinated beverages or chocolate 12 hours prior to your test. Do not take any antihistamines 12 hours prior to your test.     On the Day of the Test: Drink plenty of water until 1 hour prior to the test. Do not eat any food 4 hours prior to the test. You may take your regular medications prior to the test.  Take metoprolol (Lopressor) 100 mg two hours prior to test.        After the Test: Drink plenty of water. After receiving IV contrast, you may experience a mild flushed feeling. This is normal. On occasion, you may experience a mild rash up to 24 hours after the test. This is not dangerous. If this occurs, you can take Benadryl 25 mg and increase your fluid intake. If you experience trouble breathing, this can be serious. If it is severe call 911 IMMEDIATELY. If it is mild, please call our office.     Please allow 2-4 weeks for scheduling of routine cardiac CTs. Some insurance companies require a pre-authorization which may  delay scheduling of this test.    For non-scheduling related questions, please contact the cardiac imaging nurse navigator should you have any questions/concerns: Joshua Sandoval, Cardiac Imaging Nurse Navigator Joshua Sandoval, Cardiac Imaging Nurse Navigator [place] [facility] Direct Office Dial: [phone]    For scheduling needs, including cancellations and rescheduling, please call Joshua Sandoval, [phone].

## 2021-03-24 NOTE — Telephone Encounter (Signed)
Attempted to call pt. No answer. Lmtcb.  

## 2021-03-24 NOTE — Telephone Encounter (Signed)
-----   Message from Yvonne Kendall, MD sent at 03/24/2021  8:51 AM EST ----- Kidney function and electrolytes stable.  Ok to proceed with coronary CTA as discussed yesterday with [name], [place].

## 2021-03-25 NOTE — Telephone Encounter (Signed)
2nd attempt to contact the patient. No answer- I left a message to please call back.  See additional phone encounter open regarding the patient's Cardiac CT.

## 2021-03-25 NOTE — Telephone Encounter (Signed)
2nd attempt to contact the patient. No answer- I left a message to please call back. 

## 2021-03-26 NOTE — Telephone Encounter (Signed)
Third attempt to reach pt.  No answer. Lmtcb.  See also result note and telephone encounter of third attempt to reach re lab results.  Letter mailed asking pt to contact our office to discuss both results and procedure.

## 2021-03-26 NOTE — Telephone Encounter (Signed)
Third attempt to reach pt.  No answer. Lmtcb.  See also telephone encounter of third attempt to reach re coronary CTA. Letter mailed asking pt to contact our office to discuss both results and procedure.

## 2021-04-09 ENCOUNTER — Telehealth (HOSPITAL_COMMUNITY): Payer: Self-pay | Admitting: Emergency Medicine

## 2021-04-09 NOTE — Telephone Encounter (Signed)
Attempted to call patient regarding upcoming cardiac CT appointment. Left message on voicemail with name and callback number Rockwell Alexandria RN Navigator Cardiac Imaging [facility] [phone] Office [phone] Cell   Explained why CCTA was cancelled (PVCs/irregular ECG) and that the office [facility]  would be calling to schedule MPI. I encouraged the patient to call me back today (Friday) if there were further questions I could answer. [place] Dec

## 2021-04-12 ENCOUNTER — Ambulatory Visit: Admission: RE | Admit: 2021-04-12 | Payer: Medicare PPO | Source: Ambulatory Visit

## 2021-04-20 ENCOUNTER — Other Ambulatory Visit: Payer: Self-pay | Admitting: Internal Medicine

## 2021-04-21 ENCOUNTER — Other Ambulatory Visit: Payer: Self-pay | Admitting: *Deleted

## 2021-04-21 MED ORDER — LOSARTAN POTASSIUM 25 MG PO TABS: 25.0000 mg | ORAL_TABLET | Freq: Every day | ORAL | 0 refills | Status: DC

## 2021-05-11 ENCOUNTER — Other Ambulatory Visit: Payer: Self-pay | Admitting: Internal Medicine

## 2021-06-23 ENCOUNTER — Encounter: Payer: Self-pay | Admitting: Nurse Practitioner

## 2021-06-23 ENCOUNTER — Ambulatory Visit: Payer: Medicare PPO | Admitting: Nurse Practitioner

## 2021-06-23 ENCOUNTER — Other Ambulatory Visit: Payer: Self-pay

## 2021-06-23 VITALS — BP 128/78 | HR 64 | Ht 66.0 in | Wt 165.1 lb

## 2021-06-23 DIAGNOSIS — I493 Ventricular premature depolarization: Secondary | ICD-10-CM

## 2021-06-23 DIAGNOSIS — I10 Essential (primary) hypertension: Secondary | ICD-10-CM

## 2021-06-23 NOTE — Progress Notes (Signed)
Office Visit    Patient Name: Joshua Sandoval Date of Encounter: 06/23/2021  Primary Care Provider:  Allegra Grana, FNP Primary Cardiologist:  Yvonne Kendall, MD  Chief Complaint    88 year old male with a history of hypertension, frequent PVCs, right bundle branch block, and diastolic dysfunction, who presents for follow-up related to PVCs.  Past Medical History    Past Medical History:  Diagnosis Date   Diastolic dysfunction    a. 12/2017 Echo: EF 50-55%, no rwma, Gr1 DD, nl RV fxn; b. 03/2020 Echo: EF 55-60%, no rwma, Gr1 DD, Mildly red RV fxn, triv MR, mild to mod AoV sclerosis w/o stenosis.   History of stress test    a. 01/2018 MV: EF 55%. No ischemia/infarct-->Low risk.   Hypertension    PVC's (premature ventricular contractions)    a. 02/2018 Holter: RSR, >7k PVCs (15% burden). 641 runs of NSVT up to 6 beats.   RBBB    Past Surgical History:  Procedure Laterality Date   NO PAST SURGERIES      Allergies  Allergies  Allergen Reactions   Other     NKDA    History of Present Illness    88 year old male with the above past medical history including hypertension, frequent PVCs, right bundle branch block, and diastolic dysfunction.  He has a history of dyspnea on exertion and previously underwent echocardiography in 2021 showed EF of 55 6% trivial MR, grade 1 diastolic dysfunction, and mild to moderate aortic valve sclerosis without stenosis.  Stress testing was advised however, he initially declined.  At August 2022 visit, he was agreeable to proceed with stress testing in the setting of frequent PVCs.  Unfortunate, he never followed through with stress testing.  At his last visit in November 2022, he agreed to pursue cardiac CTA, but then did not reply to our scheduling team and thus this was never carried out (says he was traveling a lot).  Today, Joshua Sandoval says that he continues to do well.  He is going to the [facility] and walking on the treadmill 2 days a week.  He still  said he works some every day.  He denies chest pain, dyspnea, palpitations, PND, orthopnea, dizziness, syncope, edema, or early satiety.  We discussed his PVCs and ischemic work-up today and he was once again agreeable to pursue a [employer].  Home Medications    Current Outpatient Medications  Medication Sig Dispense Refill   amLODipine (NORVASC) 10 MG tablet TAKE 1 TABLET(10 MG) BY MOUTH DAILY 90 tablet 2   aspirin EC 81 MG tablet Take 81 mg by mouth as needed.      losartan (COZAAR) 25 MG tablet Take 1 tablet (25 mg total) by mouth daily. 90 tablet 0   metoprolol succinate (TOPROL-XL) 50 MG 24 hr tablet TAKE 1 TABLET(50 MG) BY MOUTH DAILY WITH OR IMMEDIATELY FOLLOWING A MEAL 90 tablet 2   No current facility-administered medications for this visit.     Review of Systems    He denies chest pain, palpitations, dyspnea, pnd, orthopnea, n, v, dizziness, syncope, edema, weight gain, or early satiety. .  All other systems reviewed and are otherwise negative except as noted above.    Physical Exam    VS:  BP 128/78 (BP Location: Left Arm, Patient Position: Sitting, Cuff Size: Normal)    Pulse 64    Ht 5\' 6"  (1.676 m)    Wt 165 lb 2 oz (74.9 kg)    SpO2 98%  BMI 26.65 kg/m  , BMI Body mass index is 26.65 kg/m.     GEN: Well nourished, well developed, in no acute distress. HEENT: normal. Neck: Supple, no JVD, carotid bruits, or masses. Cardiac: RRR, no murmurs, rubs, or gallops. No clubbing, cyanosis, edema.  Radials/PT 2+ and equal bilaterally.  Respiratory:  Respirations regular and unlabored, clear to auscultation bilaterally. GI: Soft, nontender, nondistended, BS + x 4. MS: no deformity or atrophy. Skin: warm and dry, no rash. Neuro:  Strength and sensation are intact. Psych: Normal affect.  Accessory Clinical Findings    ECG personally reviewed by me today -regular sinus rhythm, 64, left axis deviation, ventricular couplet, right bundle branch block - no acute  changes.  Lab Results  Component Value Date   WBC CANCELED 01/06/2021   HGB CANCELED 01/06/2021   HCT CANCELED 01/06/2021   MCV 83 02/26/2020   PLT CANCELED 01/06/2021   Lab Results  Component Value Date   CREATININE 1.07 03/23/2021   BUN 13 03/23/2021   NA 140 03/23/2021   K 4.9 03/23/2021   CL 102 03/23/2021   CO2 20 03/23/2021   Lab Results  Component Value Date   ALT 11 02/26/2020   AST 20 02/26/2020   ALKPHOS 50 02/26/2020   BILITOT 0.8 02/26/2020     Assessment & Plan    1.  PVCs: Patient with frequent asymptomatic PVCs over the past year.  Prior echo November 2021 showed normal LV function with grade 1 diastolic dysfunction.  We have been trying to schedule him for a [employer] and he has canceled on multiple occasions.  He was last scheduled for coronary CTA but then never followed through.  We discussed the reasoning behind ischemic testing in the setting of PVCs in detail today.  He was agreeable to try and reschedule the [facility] for next week.  He remains on beta-blocker therapy.  2.  Essential hypertension: Blood pressure stable today on beta-blocker, ARB, and calcium channel blocker.  3.  Dyspnea on exertion: Patient initially reported this in November 2021, prompting echocardiogram showed normal LV function.  Today he denies any dyspnea on exertion or chest pain.  He notes good activity at home and exercises twice a week at the [facility].  Plan to follow-up Lexiscan Myoview in the setting of PVCs as outlined above.  4.  Disposition: Follow-up stress testing as outlined above.  Follow-up in clinic in 4 to 6 months or sooner if necessary.   Nicolasa Ducking, NP 06/23/2021, 11:31 AM

## 2021-06-23 NOTE — Patient Instructions (Addendum)
**Note Joshua-Identified via Obfuscation** Medication Instructions:  - Your physician recommends that you continue on your current medications as directed. Please refer to the Current Medication list given to you today.  *If you need a refill on your cardiac medications before your next appointment, please call your pharmacy*   Lab Work: - none ordered  If you have labs (blood work) drawn today and your tests are completely normal, you will receive your results only by: Gregory (if you have MyChart) OR A paper copy in the mail If you have any lab test that is abnormal or we need to change your treatment, we will call you to review the results.   Testing/Procedures:  1) Lexiscan Myoview: (Chemical Stress Test/ Cardiac Nuclear Scan)  - Your physician has requested that you have a lexiscan myoview.   Joshua Sandoval  Your caregiver has ordered a Stress Test with nuclear imaging. The purpose of this test is to evaluate the blood supply to your heart muscle. This procedure is referred to as a "Non-Invasive Stress Test." This is because other than having an IV started in your vein, nothing is inserted or "invades" your body. Cardiac stress tests are done to find areas of poor blood flow to the heart by determining the extent of coronary artery disease (CAD). Some patients exercise on a treadmill, which naturally increases the blood flow to your heart, while others who are  unable to walk on a treadmill due to physical limitations have a pharmacologic/chemical stress agent called Lexiscan . This medicine will mimic walking on a treadmill by temporarily increasing your coronary blood flow.   Please note: these test may take anywhere between 2-4 hours to complete  PLEASE REPORT TO [facility] MEDICAL MALL ENTRANCE  THEN PROCEED TO THE 1ST DESK ON THE RIGHT TO CHECK IN (REGISTRATION)  Date of Procedure:_____________________________________  Arrival Time for Procedure:______________________________  Instructions regarding medication:    __xx__ : You may take all of your regular medications the day of your test with enough water to get them down safely.  PLEASE NOTIFY THE OFFICE AT LEAST 59 HOURS IN ADVANCE IF YOU ARE UNABLE TO KEEP YOUR APPOINTMENT.  [phone] AND  PLEASE NOTIFY NUCLEAR MEDICINE AT [facility] AT LEAST 24 HOURS IN ADVANCE IF YOU ARE UNABLE TO KEEP YOUR APPOINTMENT. [phone]  How to prepare for your Myoview test:  Do not eat or drink after midnight No caffeine for 24 hours prior to test No smoking 24 hours prior to test. Your medication may be taken with water.  If your doctor stopped a medication because of this test, do not take that medication. Ladies, please do not wear dresses.  Skirts or pants are appropriate. Please wear a short sleeve shirt. No perfume, cologne or lotion. Wear comfortable walking shoes. No heels!   Follow-Up: At [facility], you and your health needs are our priority.  As part of our continuing mission to provide you with exceptional heart care, we have created designated Provider Care Teams.  These Care Teams include your primary Cardiologist (physician) and Advanced Practice Providers (APPs -  Physician Assistants and Nurse Practitioners) who all work together to provide you with the care you need, when you need it.  We recommend signing up for the patient portal called "MyChart".  Sign up information is provided on this After Visit Summary.  MyChart is used to connect with patients for Virtual Visits (Telemedicine).  Patients are able to view lab/test results, encounter notes, upcoming appointments, etc.  Non-urgent messages can be sent to  your provider as well.   To learn more about what you can do with MyChart, go to [email or web address].    Your next appointment:   6 month(s)  The format for your next appointment:   In Person  Provider:   You may see Nelva Bush, MD or one of the following Advanced Practice Providers on your designated Care Team:    Murray Hodgkins, NP     Other Instructions  Cardiac Nuclear Scan A cardiac nuclear scan is a test that is done to check the flow of blood to your heart. It is done when you are resting and when you are exercising. The test looks for problems such as: Not enough blood reaching a portion of the heart. The heart muscle not working as it should. You may need this test if: You have heart disease. You have had lab results that are not normal. You have had heart surgery or a balloon procedure to open up blocked arteries (angioplasty). You have chest pain. You have shortness of breath. In this test, a special dye (tracer) is put into your bloodstream. The tracer will travel to your heart. A camera will then take pictures of your heart to see how the tracer moves through your heart. This test is usually done at a hospital and takes 2-4 hours. Tell a doctor about: Any allergies you have. All medicines you are taking, including vitamins, herbs, eye drops, creams, and over-the-counter medicines. Any problems you or family members have had with anesthetic medicines. Any blood disorders you have. Any surgeries you have had. Any medical conditions you have. Whether you are pregnant or may be pregnant. What are the risks? Generally, this is a safe test. However, problems may occur, such as: Serious chest pain and heart attack. This is only a risk if the stress portion of the test is done. Rapid heartbeat. A feeling of warmth in your chest. This feeling usually does not last long. Allergic reaction to the tracer. What happens before the test? Ask your doctor about changing or stopping your normal medicines. This is important. Follow instructions from your doctor about what you cannot eat or drink. Remove your jewelry on the day of the test. What happens during the test? An IV tube will be inserted into one of your veins. Your doctor will give you a small amount of tracer through the IV  tube. You will wait for 20-40 minutes while the tracer moves through your bloodstream. Your heart will be monitored with an electrocardiogram (ECG). You will lie down on an exam table. Pictures of your heart will be taken for about 15-20 minutes. You may also have a stress test. For this test, one of these things may be done: You will be asked to exercise on a treadmill or a stationary bike. You will be given medicines that will make your heart work harder. This is done if you are unable to exercise. When blood flow to your heart has peaked, a tracer will again be given through the IV tube. After 20-40 minutes, you will get back on the exam table. More pictures will be taken of your heart. Depending on the tracer that is used, more pictures may need to be taken 3-4 hours later. Your IV tube will be removed when the test is over. The test may vary among doctors and hospitals. What happens after the test? Ask your doctor: Whether you can return to your normal schedule, including diet, activities, and medicines. Whether you should drink more  fluids. This will help to remove the tracer from your body. Drink enough fluid to keep your pee (urine) pale yellow. Ask your doctor, or the department that is doing the test: When will my results be ready? How will I get my results? Summary A cardiac nuclear scan is a test that is done to check the flow of blood to your heart. Tell your doctor whether you are pregnant or may be pregnant. Before the test, ask your doctor about changing or stopping your normal medicines. This is important. Ask your doctor whether you can return to your normal activities. You may be asked to drink more fluids. This information is not intended to replace advice given to you by your health care provider. Make sure you discuss any questions you have with your health care provider. Document Revised: 01/13/2021 Document Reviewed: 10/14/2020 Elsevier Patient Education  [employer].

## 2021-06-28 ENCOUNTER — Telehealth: Payer: Self-pay | Admitting: Family

## 2021-06-28 NOTE — Telephone Encounter (Signed)
noted 

## 2021-06-28 NOTE — Telephone Encounter (Signed)
Call and triage pt  Please ensure that he is not having CP, sob and he feels safe to be home  We can move his appt to my lunch tomorrow  If he feels he needs to be seen today, he would need to go to urgent care.

## 2021-06-28 NOTE — Telephone Encounter (Signed)
I called and spoke to patient. He stated that he has absolutely no symptoms whatsoever. His wife asked him to take a test because he has been around a lot of people & he was okay waiting until Wednesday to be seen, but I advised if he started to have symptoms & felt differently tomorrow please call ASAP so we may add at 12p. Pt verbalized understanding of this. I also advised that we needed to get him in for establish care again bc he has not been seen since 12/2017.

## 2021-06-28 NOTE — Telephone Encounter (Signed)
pt spouse called and stated pt tested postive for covid on 2/12 and would like medication. Pt has appointment on 2/15

## 2021-06-30 ENCOUNTER — Ambulatory Visit (INDEPENDENT_AMBULATORY_CARE_PROVIDER_SITE_OTHER): Payer: Medicare PPO | Admitting: Family

## 2021-06-30 ENCOUNTER — Encounter: Payer: Self-pay | Admitting: Family

## 2021-06-30 DIAGNOSIS — U071 COVID-19: Secondary | ICD-10-CM

## 2021-06-30 NOTE — Progress Notes (Signed)
Verbal consent for services obtained from patient prior to services given to TELEPHONE visit:   Location of call:  provider at work patient at home  Names of all persons present for services: Rennie Plowman, NP and patient  Last seen 02/2018 in our practice by Leanora Cover  Chief complaint:   Acute and to re - establish care.  Initially had congestion and cough 6 days ago, completely resolved.  No fever, sob, cp. He is not taking medication for symptoms.  COVID-positive 3 days ago.  He had gone to funeral 5 days ago.  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  He is ready to get back to work He has not had covid booster  BP Readings from Last 3 Encounters:  06/23/21 128/78  03/23/21 134/60  01/06/21 (!) 154/78    A/P/next steps:  Problem List Items Addressed This Visit       Other   COVID-19    Symptoms completely resolved. Advised to get COVID booster in 3 months time.  He is outside the window for treatment with antiviral and he also has incredibly mild symptoms which have resolved at this time.  He has been in quarantine for greater than 5 days and advised to be returned to work on day 6 wearing a mask the remainder of the 5 days to complete a 10-day quarantine.  Work note provided as his preference is to return to work 07/05/21.        I spent 15 min  discussing plan of care over the phone.

## 2021-06-30 NOTE — Assessment & Plan Note (Addendum)
Symptoms completely resolved. Advised to get COVID booster in 3 months time.  He is outside the window for treatment with antiviral and he also has incredibly mild symptoms which have resolved at this time.  He has been in quarantine for greater than 5 days and advised to be returned to work on day 6 wearing a mask the remainder of the 5 days to complete a 10-day quarantine.  Work note provided as his preference is to return to work 07/05/21.

## 2021-07-02 ENCOUNTER — Telehealth: Payer: Self-pay | Admitting: Family

## 2021-07-02 ENCOUNTER — Other Ambulatory Visit: Payer: Self-pay

## 2021-07-02 ENCOUNTER — Encounter: Payer: Self-pay | Admitting: Family

## 2021-07-02 ENCOUNTER — Ambulatory Visit: Payer: Medicare PPO | Admitting: Family

## 2021-07-02 VITALS — BP 160/70 | HR 69 | Temp 98.5°F | Ht 70.0 in | Wt 157.6 lb

## 2021-07-02 DIAGNOSIS — R413 Other amnesia: Secondary | ICD-10-CM

## 2021-07-02 DIAGNOSIS — R7309 Other abnormal glucose: Secondary | ICD-10-CM | POA: Diagnosis not present

## 2021-07-02 DIAGNOSIS — Z113 Encounter for screening for infections with a predominantly sexual mode of transmission: Secondary | ICD-10-CM | POA: Diagnosis not present

## 2021-07-02 DIAGNOSIS — I10 Essential (primary) hypertension: Secondary | ICD-10-CM

## 2021-07-02 NOTE — Telephone Encounter (Signed)
Should I advise patient metoprolol following a meal? Does it matter which one? Then amlodipine in the morning & losartan at night?

## 2021-07-02 NOTE — Telephone Encounter (Signed)
Pt spouse called in wanting to know if pt can take blood pressure and heart medicine at the same time.

## 2021-07-02 NOTE — Patient Instructions (Signed)
Referral to neurology and also order placed for MRI of the brain  Let us know if you dont hear back within a week in regards to an appointment being scheduled.

## 2021-07-02 NOTE — Telephone Encounter (Signed)
I would advise taking 2 in the morning together and then 1 in the evening. sometimes blood pressure is better controlled we separate the medications Doesn't matter which ones   Is she taking ALL 3 of them?

## 2021-07-02 NOTE — Assessment & Plan Note (Addendum)
Scored 20/30 on MMSE.   pending labs, neuroimaging and also placed a referral to neurology.  Advised patient and wife today patient to stop driving for his and other's safety. Close follow up.

## 2021-07-02 NOTE — Assessment & Plan Note (Signed)
Elevated today however improved during visit.  It is unclear if patient's been compliant with amlodipine 10mg  , losartan 25mg , and toprol 50mg .  I have asked patient to call and let know if he is taking all of his medications prior to making any antihypertension medication changes.  We also provided him with a medication box to aid in compliance.  Close follow-up

## 2021-07-02 NOTE — Progress Notes (Signed)
Subjective:    Patient ID: Joshua Sandoval, male    DOB: 1932-11-15, 88 y.o.   MRN: [id number]  CC: Joshua Sandoval is a 88 y.o. male who presents today for follow up.   HPI: Accompanied by wife  Here to discuss family concern of changes in memory. Wife has noticed leaving coffee pot on or leaving door open and unlocked.   Wife reports that he sleeping to much during the day and trouble staying asleep during the night.   He is still driving.   He hasnt gotten lost.   He continues to work as traffic guard at [employer]  He is physically active.  Wife feels he is eating less than in the past.  No si/hi.  He denies depression and anxiety   He is only taking two blood pressure medications. Wife isnt sure if he is taking amlodipine 10mg  , losartan 25mg , and toprol 50mg  . Patients reports that he took this morning.  No sob, cp, palpitations    HISTORY:  Past Medical History:  Diagnosis Date   Diastolic dysfunction    a. 12/2017 Echo: EF 50-55%, no rwma, Gr1 DD, nl RV fxn; b. 03/2020 Echo: EF 55-60%, no rwma, Gr1 DD, Mildly red RV fxn, triv MR, mild to mod AoV sclerosis w/o stenosis.   History of stress test    a. 01/2018 MV: EF 55%. No ischemia/infarct-->Low risk.   Hypertension    PVC's (premature ventricular contractions)    a. 02/2018 Holter: RSR, >7k PVCs (15% burden). 641 runs of NSVT up to 6 beats.   RBBB    Past Surgical History:  Procedure Laterality Date   NO PAST SURGERIES     Family History  Problem Relation Age of Onset   Hypertension Mother    Stroke Mother     Allergies: Other Current Outpatient Medications on File Prior to Visit  Medication Sig Dispense Refill   amLODipine (NORVASC) 10 MG tablet TAKE 1 TABLET(10 MG) BY MOUTH DAILY 90 tablet 2   aspirin EC 81 MG tablet Take 81 mg by mouth as needed.      losartan (COZAAR) 25 MG tablet Take 1 tablet (25 mg total) by mouth daily. 90 tablet 0   metoprolol succinate (TOPROL-XL) 50 MG 24 hr tablet TAKE 1 TABLET(50 MG)  BY MOUTH DAILY WITH OR IMMEDIATELY FOLLOWING A MEAL 90 tablet 2   No current facility-administered medications on file prior to visit.    Social History   Tobacco Use   Smoking status: Former    Types: Cigarettes    Quit date: 1980    Years since quitting: 43.1   Smokeless tobacco: Never  Substance Use Topics   Alcohol use: Not Currently   Drug use: No    Review of Systems  Constitutional:  Negative for chills and fever.  Respiratory:  Negative for cough.   Cardiovascular:  Negative for chest pain and palpitations.  Gastrointestinal:  Negative for nausea and vomiting.  Psychiatric/Behavioral:  Positive for sleep disturbance.      Objective:    BP (!) 160/70    Pulse 69    Temp 98.5 F (36.9 C) (Oral)    Ht 5\' 10"  (1.778 m)    Wt 157 lb 9.6 oz (71.5 kg)    SpO2 98%    BMI 22.61 kg/m  BP Readings from Last 3 Encounters:  07/02/21 (!) 160/70  06/23/21 128/78  03/23/21 134/60   Wt Readings from Last 3 Encounters:  07/02/21 157 lb 9.6  oz (71.5 kg)  06/30/21 160 lb (72.6 kg)  06/23/21 165 lb 2 oz (74.9 kg)    Physical Exam Vitals reviewed.  Constitutional:      Appearance: He is well-developed.  Cardiovascular:     Rate and Rhythm: Regular rhythm.     Heart sounds: Normal heart sounds.  Pulmonary:     Effort: Pulmonary effort is normal. No respiratory distress.     Breath sounds: Normal breath sounds. No wheezing, rhonchi or rales.  Skin:    General: Skin is warm and dry.  Neurological:     Mental Status: He is alert.  Psychiatric:        Speech: Speech normal.        Behavior: Behavior normal.       Assessment & Plan:   Problem List Items Addressed This Visit       Cardiovascular and Mediastinum   Essential hypertension    Elevated today however improved during visit.  It is unclear if patient's been compliant with amlodipine 10mg  , losartan 25mg , and toprol 50mg .  I have asked patient to call and let us know if he is taking all of his medications prior  to making any antihypertension medication changes.  We also provided him with a medication box to aid in compliance.  Close follow-up        Other   Memory changes - Primary    Scored 20/30 on MMSE.   pending labs, neuroimaging and also placed a referral to neurology.  Advised patient and wife today patient to stop driving for his and other's safety. Close follow up.       Relevant Orders   MR BRAIN W WO CONTRAST   Ambulatory referral to Neurology   B12 and Folate Panel   CBC with Differential/Platelet   Comprehensive metabolic panel   Hemoglobin A1c   RPR   TSH     I am having Riley Kill maintain his aspirin EC, metoprolol succinate, amLODipine, and losartan.   No orders of the defined types were placed in this encounter.   Return precautions given.   Risks, benefits, and alternatives of the medications and treatment plan prescribed today were discussed, and patient expressed understanding.   Education regarding symptom management and diagnosis given to patient on AVS.  Continue to follow with Burnard Hawthorne, FNP for routine health maintenance.   Riley Kill and I agreed with plan.   Mable Paris, FNP  I have spent 25 minutes with a patient including precharting, exam, reviewing medical records, and discussion plan of care with wife and patient.

## 2021-07-05 LAB — COMPREHENSIVE METABOLIC PANEL
AG Ratio: 1.2 (calc) (ref 1.0–2.5)
ALT: 12 U/L (ref 9–46)
AST: 20 U/L (ref 10–35)
Albumin: 3.9 g/dL (ref 3.6–5.1)
Alkaline phosphatase (APISO): 40 U/L (ref 35–144)
BUN/Creatinine Ratio: 12 (calc) (ref 6–22)
BUN: 16 mg/dL (ref 7–25)
CO2: 25 mmol/L (ref 20–32)
Calcium: 9.4 mg/dL (ref 8.6–10.3)
Chloride: 101 mmol/L (ref 98–110)
Creat: 1.35 mg/dL — ABNORMAL HIGH (ref 0.70–1.22)
Globulin: 3.2 g/dL (calc) (ref 1.9–3.7)
Glucose, Bld: 121 mg/dL — ABNORMAL HIGH (ref 65–99)
Potassium: 4.5 mmol/L (ref 3.5–5.3)
Sodium: 136 mmol/L (ref 135–146)
Total Bilirubin: 1 mg/dL (ref 0.2–1.2)
Total Protein: 7.1 g/dL (ref 6.1–8.1)

## 2021-07-05 LAB — CBC WITH DIFFERENTIAL/PLATELET
Absolute Monocytes: 413 cells/uL (ref 200–950)
Basophils Absolute: 20 cells/uL (ref 0–200)
Basophils Relative: 0.5 %
Eosinophils Absolute: 140 cells/uL (ref 15–500)
Eosinophils Relative: 3.6 %
HCT: 45 % (ref 38.5–50.0)
Hemoglobin: 14.8 g/dL (ref 13.2–17.1)
Lymphs Abs: 1903 cells/uL (ref 850–3900)
MCH: 27.8 pg (ref 27.0–33.0)
MCHC: 32.9 g/dL (ref 32.0–36.0)
MCV: 84.4 fL (ref 80.0–100.0)
MPV: 11.1 fL (ref 7.5–12.5)
Monocytes Relative: 10.6 %
Neutro Abs: 1424 cells/uL — ABNORMAL LOW (ref 1500–7800)
Neutrophils Relative %: 36.5 %
Platelets: 149 10*3/uL (ref 140–400)
RBC: 5.33 10*6/uL (ref 4.20–5.80)
RDW: 13.4 % (ref 11.0–15.0)
Total Lymphocyte: 48.8 %
WBC: 3.9 10*3/uL (ref 3.8–10.8)

## 2021-07-05 LAB — TSH: TSH: 1.82 mIU/L (ref 0.40–4.50)

## 2021-07-05 LAB — B12 AND FOLATE PANEL
Folate: 13 ng/mL
Vitamin B-12: 414 pg/mL (ref 200–1100)

## 2021-07-05 LAB — HEMOGLOBIN A1C
Hgb A1c MFr Bld: 6.5 % of total Hgb — ABNORMAL HIGH (ref <5.7)
Mean Plasma Glucose: 140 mg/dL
eAG (mmol/L): 7.7 mmol/L

## 2021-07-05 LAB — RPR: RPR Ser Ql: NONREACTIVE

## 2021-07-05 NOTE — Telephone Encounter (Signed)
I called and spoke with patient's wife. They will try as suggested two medications in the morning & one in the evening. He is taking all three,

## 2021-07-05 NOTE — Telephone Encounter (Signed)
noted 

## 2021-07-05 NOTE — Telephone Encounter (Signed)
Lft pt vm to call ofc to sch MRI. thanks 

## 2021-07-06 ENCOUNTER — Other Ambulatory Visit: Payer: Self-pay

## 2021-07-06 DIAGNOSIS — Z0000 Encounter for general adult medical examination without abnormal findings: Secondary | ICD-10-CM

## 2021-07-06 NOTE — Telephone Encounter (Signed)
I spoke with patient's wife & advised again on how patient should be taking BP medications. That he can take two in the morning & one in the evening for best BP control.

## 2021-07-06 NOTE — Telephone Encounter (Signed)
I spoke with pt wife she would like a call back regarding pt medication she stated she was sleeping when it was given to her. Please advise and Thank you!

## 2021-07-06 NOTE — Telephone Encounter (Signed)
Pt returning call

## 2021-07-08 ENCOUNTER — Telehealth: Payer: Self-pay

## 2021-07-08 ENCOUNTER — Other Ambulatory Visit: Payer: Self-pay

## 2021-07-08 ENCOUNTER — Ambulatory Visit
Admission: RE | Admit: 2021-07-08 | Discharge: 2021-07-08 | Disposition: A | Discharge status: Home / Self Care | Payer: Medicare PPO | Source: Ambulatory Visit | Attending: Family | Admitting: Family

## 2021-07-08 DIAGNOSIS — R413 Other amnesia: Secondary | ICD-10-CM | POA: Diagnosis not present

## 2021-07-08 DIAGNOSIS — G319 Degenerative disease of nervous system, unspecified: Secondary | ICD-10-CM | POA: Diagnosis not present

## 2021-07-08 IMAGING — MR MR HEAD WO/W CM
14 series · 48 of 48 positions shown · IV contrast (7.5ml Gadavist)
Comparison: Head CT [DATE].

CLINICAL DATA: Provided history: Memory changes.

EXAM:
MRI HEAD WITHOUT AND WITH CONTRAST
TECHNIQUE: Multiplanar, multiecho pulse sequences of the brain and surrounding
structures were obtained without and with intravenous contrast.
CONTRAST:  7.5mL GADAVIST GADOBUTROL 1 MMOL/ML IV SOLN

[Series 5: ax dwi_tracew · axial · 3.0mm · 0.65mm/px · z∈[-59,+93]mm · 3 of 48 slices shown]
[im 1/48]
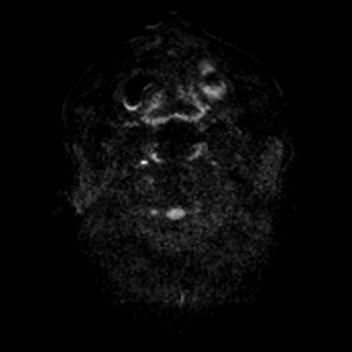
[im 24/48]
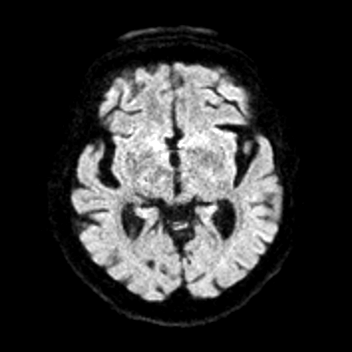
[im 48/48]
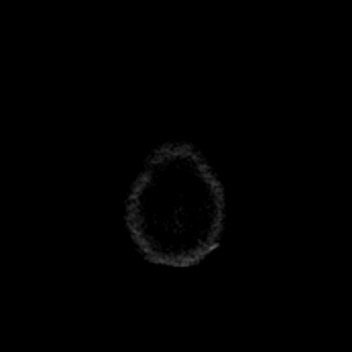

[Series 6: ax dwi_adc · axial · 3.0mm · 0.65mm/px · z∈[-59,+93]mm · 3 of 48 slices shown]
[im 1/48]
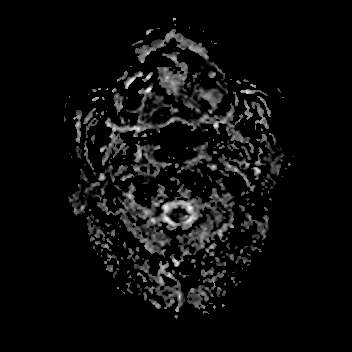
[im 24/48]
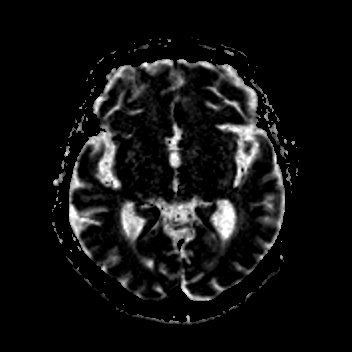
[im 48/48]
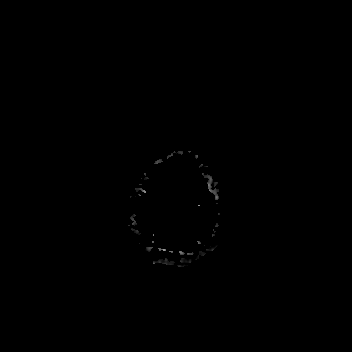

[Series 7: cor dwi_tracew · coronal · 5.0mm · 0.68mm/px · 2 of 40 slices shown]
[im 1/40]
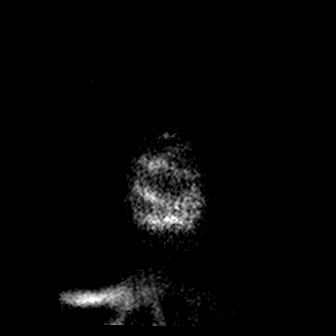
[im 40/40]
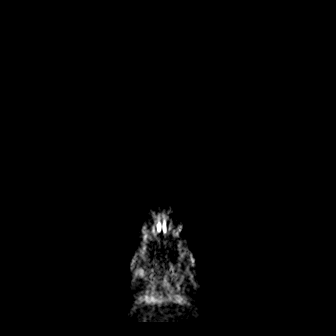

[Series 8: cor dwi_adc · coronal · 5.0mm · 0.68mm/px · 2 of 40 slices shown]
[im 1/40]
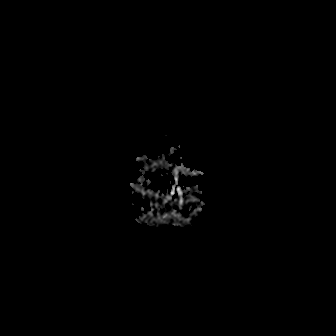
[im 40/40]
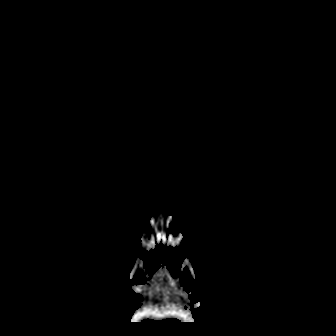

[Series 9: T1 · sagittal · 5.0mm · 0.62mm/px · 1 of 23 slices shown (1 of 2)]
[im 1/23]
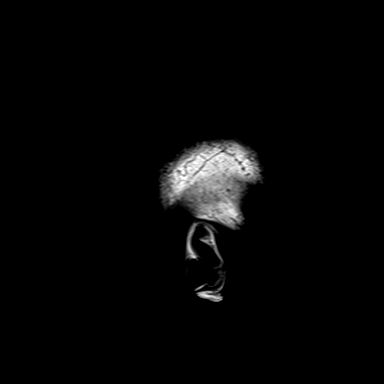

[Series 10: T2 · axial · 5.0mm · 0.53mm/px · 1 of 25 slices shown]
[im 1/25]
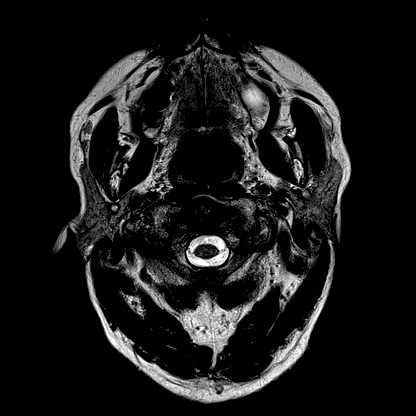

[Series 11: mag_images · axial · 3.0mm · 0.90mm/px · z∈[-70,+103]mm · 3 of 60 slices shown]
[im 1/60]
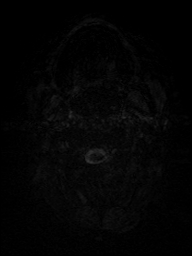
[im 30/60]
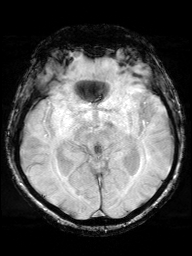
[im 60/60]
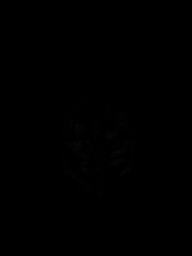

[Series 12: pha_images · axial · 3.0mm · 0.90mm/px · z∈[-70,+103]mm · 3 of 60 slices shown]
[im 1/60]
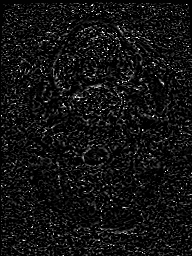
[im 30/60]
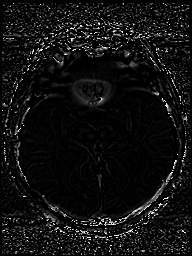
[im 60/60]
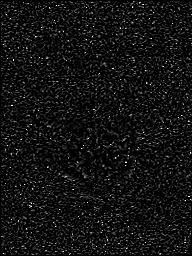

[Series 13: swi_images · axial · 3.0mm · 0.90mm/px · z∈[-70,+103]mm · 3 of 60 slices shown]
[im 1/60]
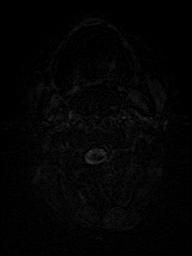
[im 30/60]
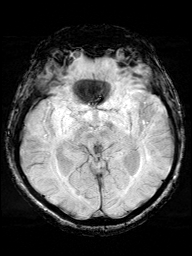
[im 60/60]
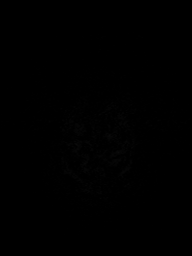

[Series 15: FLAIR · axial · 3.0mm · 0.53mm/px · z∈[-64,+94]mm · 3 of 55 slices shown]
[im 1/55]
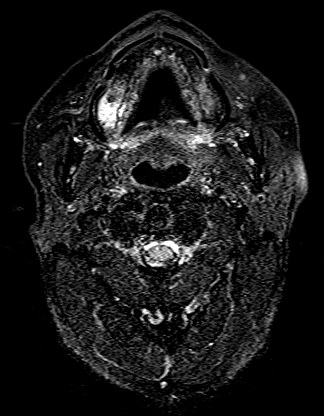
[im 28/55]
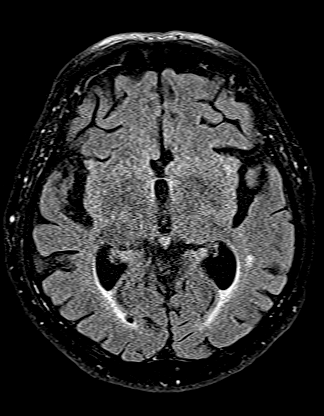
[im 55/55]
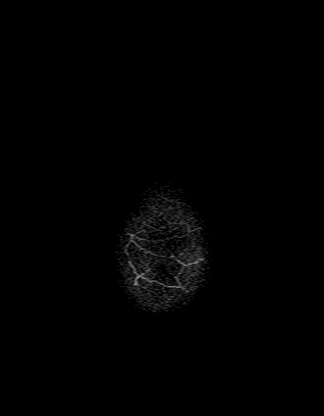

[Series 16: T1 · axial · 1.0mm · 0.98mm/px · z∈[-67,+104]mm · 10 of 176 slices shown (2 of 2)]
[im 1/176]
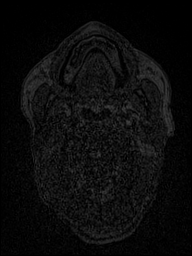
[im 20/176]
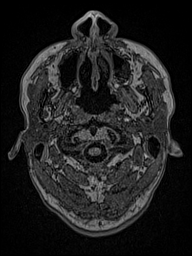
[im 39/176]
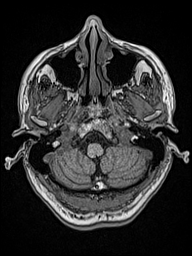
[im 59/176]
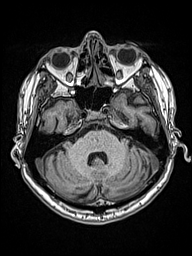
[im 78/176]
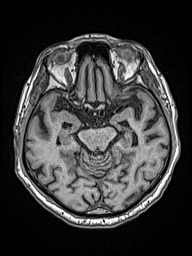
[im 98/176]
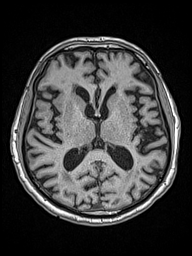
[im 117/176]
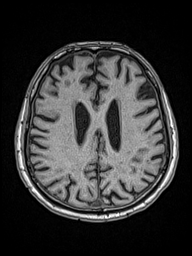
[im 137/176]
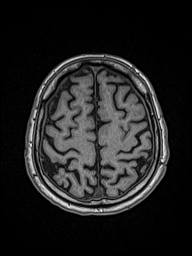
[im 156/176]
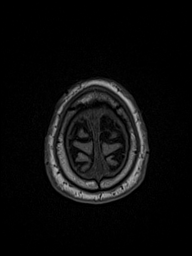
[im 176/176]
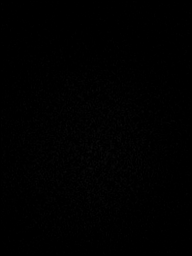

[Series 17: T2 post-contrast · coronal · 5.0mm · 0.57mm/px · 2 of 29 slices shown]
[im 1/29]
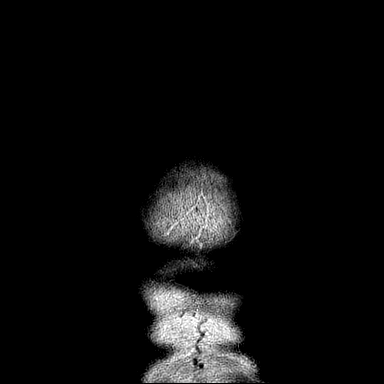
[im 29/29]
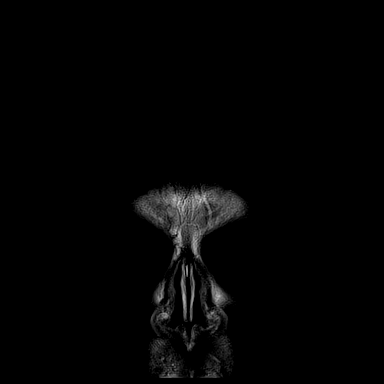

[Series 18: T1 post-contrast · coronal · 5.0mm · 0.90mm/px · 2 of 29 slices shown (1 of 2)]
[im 1/29]
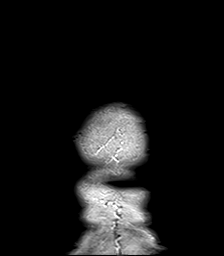
[im 29/29]
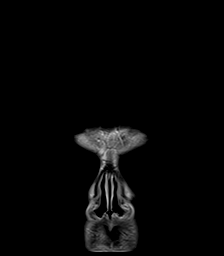

[Series 19: T1 post-contrast · axial · 1.0mm · 0.98mm/px · z∈[-67,+104]mm · 10 of 176 slices shown (2 of 2)]
[im 1/176]
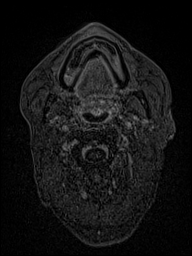
[im 20/176]
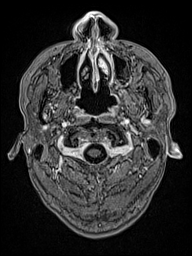
[im 39/176]
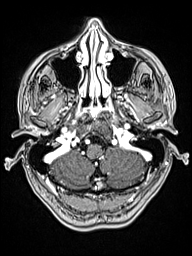
[im 59/176]
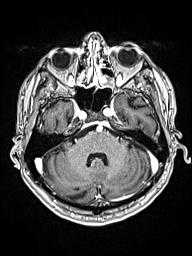
[im 78/176]
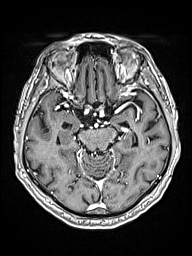
[im 98/176]
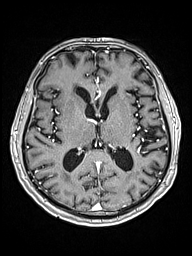
[im 117/176]
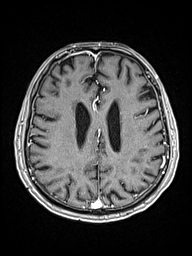
[im 137/176]
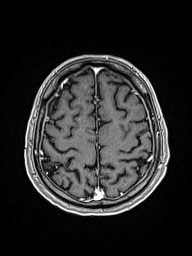
[im 156/176]
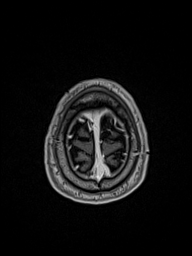
[im 176/176]
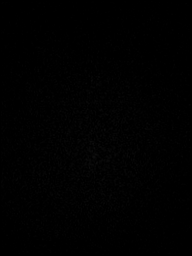

[48 of 48 positions shown; findings below may reference images not displayed]

FINDINGS: Brain:

Moderate generalized cerebral atrophy. Commensurate prominence of
the ventricles and sulci. Comparatively mild cerebellar atrophy.

Mild-to-moderate multifocal T2 FLAIR hyperintense signal abnormality
within the cerebral white matter, nonspecific but compatible chronic
small vessel ischemic disease. Mild chronic small vessel ischemic
changes are also present within the pons.

Small chronic infarct within the right cerebellar hemisphere.

There is no acute infarct.

No evidence of an intracranial mass.

No chronic intracranial blood products.

No extra-axial fluid collection.

No midline shift.

No pathologic parenchymal enhancement identified.

Vascular: Maintained flow voids within the proximal large arterial
vessels. Bulbous appearance of the A1/A2 junction of the left
anterior cerebral artery (for instance as seen on series 100, image
103).

Skull and upper cervical spine: No focal suspicious marrow lesion.

Sinuses/Orbits: Visualized orbits show no acute finding. Partial
opacification of the posterior ethmoid air cells, bilaterally.
Mucosal thickening versus 2 cm mucous retention cyst within the
inferior left maxillary sinus.

Impression #4 will be called to the ordering clinician or
representative by the Radiologist Assistant, and communication
documented in the PACS or [REDACTED].
IMPRESSION: 1. No evidence of acute intracranial abnormality.
2. Chronic small-vessel ischemic changes which are mild-to-moderate
in the cerebral white matter, and mild in the pons.
3. Small chronic infarct within the right cerebellar hemisphere.
4. Bulbous appearance of the A1/A2 junction of the left anterior
cerebral artery. This may reflect anatomic variation or the presence
of an aneurysm. MR or CT angiography recommended for further
evaluation.
5. Moderate generalized cerebral atrophy.
6. Comparatively mild cerebellar atrophy.
7. Paranasal sinus disease, as described.

## 2021-07-08 MED ORDER — GADOBUTROL 1 MMOL/ML IV SOLN
7.5000 mL | Freq: Once | INTRAVENOUS | Status: AC | PRN
  Administered 2021-07-08: 7.5 mL via INTRAVENOUS

## 2021-07-08 NOTE — Telephone Encounter (Signed)
French Ana from [facility] called and wanted to make PCP aware that old infarct was seen on patient's MRI done today.

## 2021-07-08 NOTE — Telephone Encounter (Signed)
Please place Neurology referral ask pt if wants to go to [facility] neurology or [facility] neurology Dr. Sherryll Burger please ?  Old stroke appears present on MRI and age related changes

## 2021-07-08 NOTE — Telephone Encounter (Signed)
FYI I spoke with patient's wife & she wanted patient referral to Dr. Sherryll Burger over at [facility]. Referral placed to them bc they did not want to travel to [place].

## 2021-07-09 ENCOUNTER — Other Ambulatory Visit: Payer: Self-pay | Admitting: Family

## 2021-07-09 DIAGNOSIS — R9389 Abnormal findings on diagnostic imaging of other specified body structures: Secondary | ICD-10-CM

## 2021-07-09 NOTE — Telephone Encounter (Signed)
Noted Referral to dr Manuella Ghazi in place   Rasheedah, what is status of this?

## 2021-07-12 NOTE — Telephone Encounter (Signed)
-----   Message from Joshua Mc, MD sent at 07/09/2021  2:30 PM EST ----- I would not treat if he is not having symptoms  ----- Message ----- From: Burnard Hawthorne, FNP Sent: 07/09/2021   1:10 PM EST To: Joshua Mc, MD  TT,   I ordered MRI brain due to memory changes for gentleman I hadnt seen in some time.   Due to cerebral atrophy, prior remote infarct and memory changes, I have a referral in place to neurology.   I have also ordered MRA brain wo contrast to evaluate for anatomical variant versus aneurysm also noted on MRI brain.  My question to you - how to manage mucosal thickening v 2cm mucous retention cyst seen on MRI brain?  He did not mention any sinus concerns during her visit.  I do not want to overwhelm him either. From what I can tell from reading nasal polyps are more of concern versus retention cyst.    For nasal polyps, I have seen ENT most often do a trial of Flonase , prednisone to see if resolves and then repeat imaging. Do I refer?  If he is not having symptoms, do I do anything for a retention cyst, mucosal thickening?

## 2021-07-12 NOTE — Telephone Encounter (Signed)
Called PATIENT AND WAS NOT ABLE TO LEAVE VOICEMAIL MESSAGE.

## 2021-07-12 NOTE — Telephone Encounter (Signed)
Call pt  Please advise that I re reviewed MRI brain with supervising physician Dr Darrick Huntsman.   Discussed sinus disease, specifically possible mucosal thickening or mucous retention cyst.    Is patient having any sinus pain or tenderness, congestion, nosebleeds, HA?   If he is feeling well without any sinus complaints, we do not have to follow any further.  If he has sinus symptoms, please arrange visit so we can discuss.  We also could consider a consult with ENT

## 2021-07-13 NOTE — Telephone Encounter (Signed)
Was not able to speak to Joshua Sandoval, but In speaking to his wife Joshua Sandoval, I informed her to check and see if he was having any sinus pain, tenderness, congestion and let us know. She Stated she would do so and get back in contact with [place] through my chart.

## 2021-07-15 NOTE — Telephone Encounter (Signed)
Spoke to Mrs Joshua Sandoval and she stated that he has an appointment tomorrow on March 3rd. And he said he is feeling ok. ?

## 2021-07-16 ENCOUNTER — Other Ambulatory Visit: Payer: Self-pay

## 2021-07-16 ENCOUNTER — Other Ambulatory Visit (INDEPENDENT_AMBULATORY_CARE_PROVIDER_SITE_OTHER): Payer: Medicare PPO

## 2021-07-16 DIAGNOSIS — Z0000 Encounter for general adult medical examination without abnormal findings: Secondary | ICD-10-CM

## 2021-07-16 LAB — LIPID PANEL
Cholesterol: 251 mg/dL — ABNORMAL HIGH (ref 0–200)
HDL: 59.9 mg/dL (ref >39.00)
LDL Cholesterol: 179 mg/dL — ABNORMAL HIGH (ref 0–99)
NonHDL: 191.04
Total CHOL/HDL Ratio: 4
Triglycerides: 60 mg/dL (ref 0.0–149.0)
VLDL: 12 mg/dL (ref 0.0–40.0)

## 2021-07-16 LAB — BASIC METABOLIC PANEL
BUN: 15 mg/dL (ref 6–23)
CO2: 29 mEq/L (ref 19–32)
Calcium: 9.8 mg/dL (ref 8.4–10.5)
Chloride: 101 mEq/L (ref 96–112)
Creatinine, Ser: 1.02 mg/dL (ref 0.40–1.50)
GFR: 66.44 mL/min (ref >60.00)
Glucose, Bld: 99 mg/dL (ref 70–99)
Potassium: 4.5 mEq/L (ref 3.5–5.1)
Sodium: 138 mEq/L (ref 135–145)

## 2021-07-19 ENCOUNTER — Encounter: Payer: Self-pay | Admitting: Family

## 2021-07-22 ENCOUNTER — Other Ambulatory Visit: Payer: Self-pay

## 2021-07-22 ENCOUNTER — Ambulatory Visit
Admission: RE | Admit: 2021-07-22 | Discharge: 2021-07-22 | Disposition: A | Discharge status: Home / Self Care | Payer: Medicare PPO | Source: Ambulatory Visit | Attending: Family | Admitting: Family

## 2021-07-22 DIAGNOSIS — R9389 Abnormal findings on diagnostic imaging of other specified body structures: Secondary | ICD-10-CM | POA: Diagnosis not present

## 2021-07-22 DIAGNOSIS — I671 Cerebral aneurysm, nonruptured: Secondary | ICD-10-CM | POA: Insufficient documentation

## 2021-07-22 IMAGING — MR MR MRA HEAD W/O CM
2 series · 19 of 48 positions shown · non-contrast
Comparison: No pertinent prior exam.

CLINICAL DATA: Possible aneurysm follow-up.  Abnormal MRI.

EXAM:
MRA HEAD WITHOUT CONTRAST
TECHNIQUE: Angiographic images of the Circle of Willis were acquired using MRA
technique without intravenous contrast.

[Series 14: TOF · axial · 0.5mm · 0.41mm/px · z∈[-50,+48]mm · 18 of 205 slices shown]
[im 1/205]
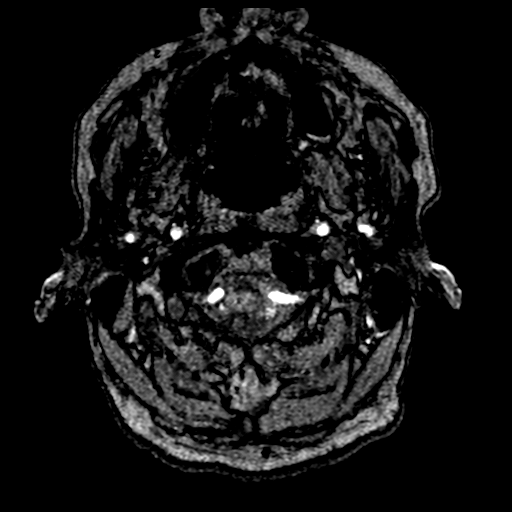
[im 5/205]
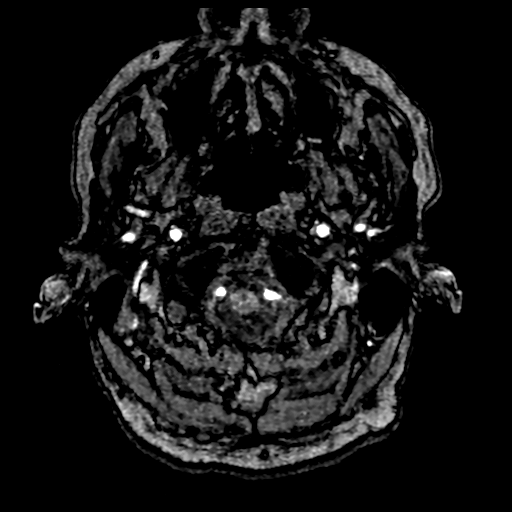
[im 9/205]
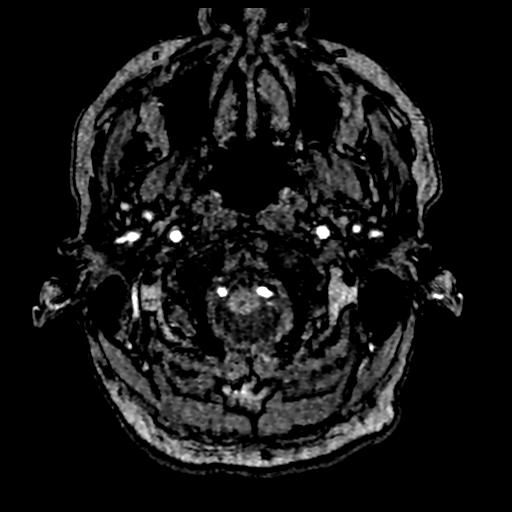
[im 14/205]
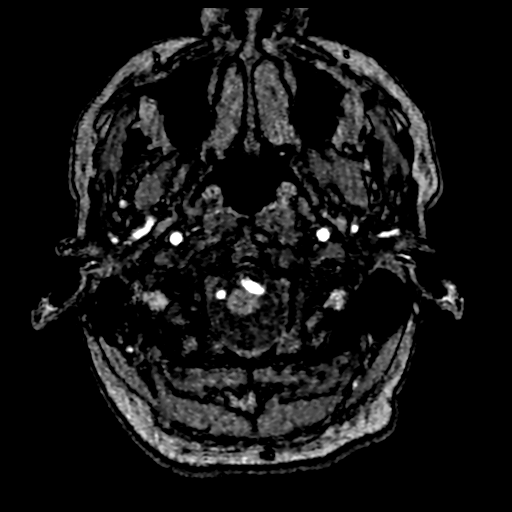
[im 18/205]
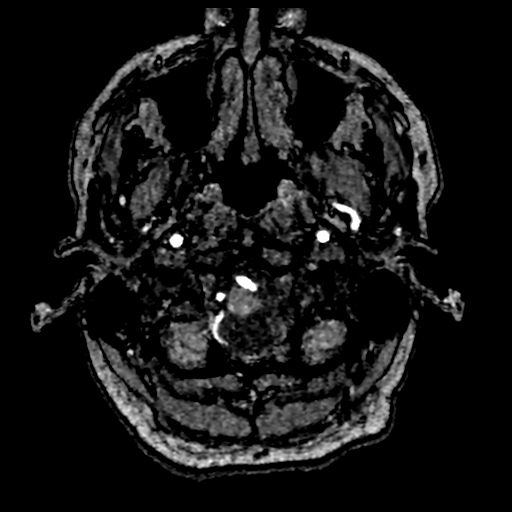
[im 23/205]
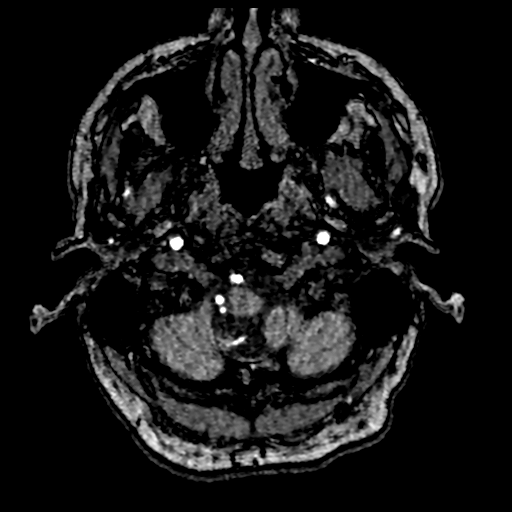
[im 27/205]
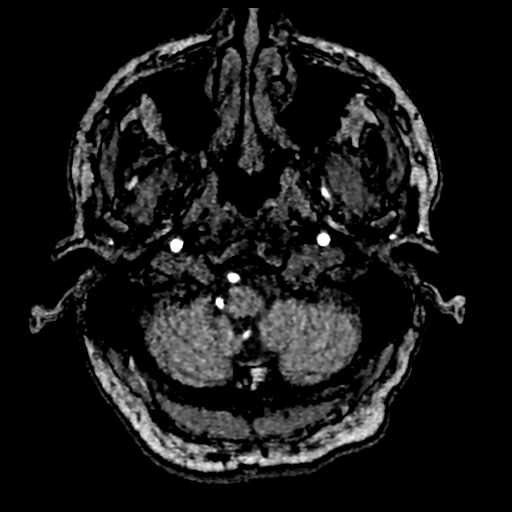
[im 32/205]
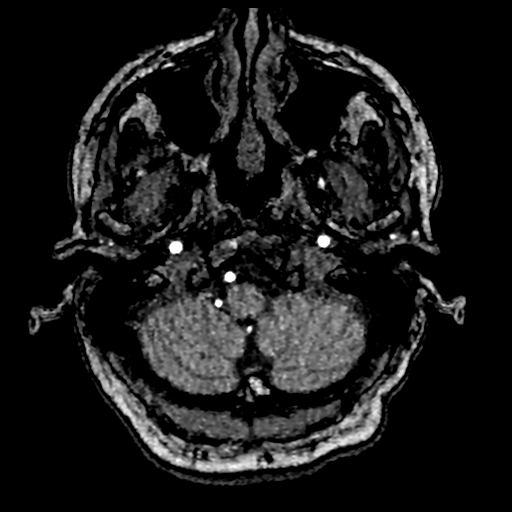
[im 36/205]
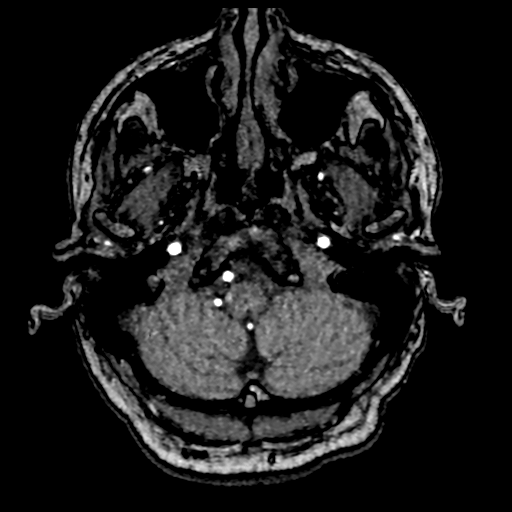
[im 40/205]
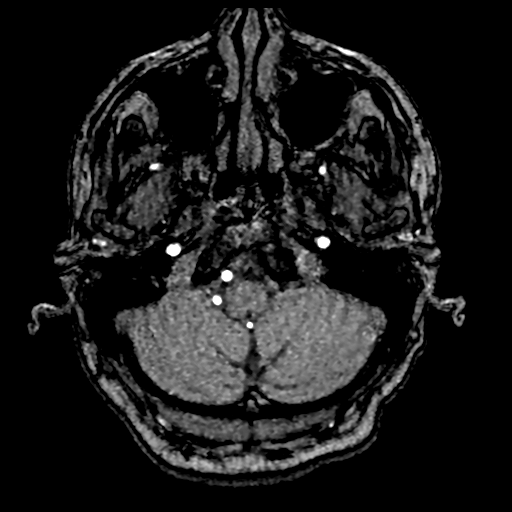
[im 63/205]
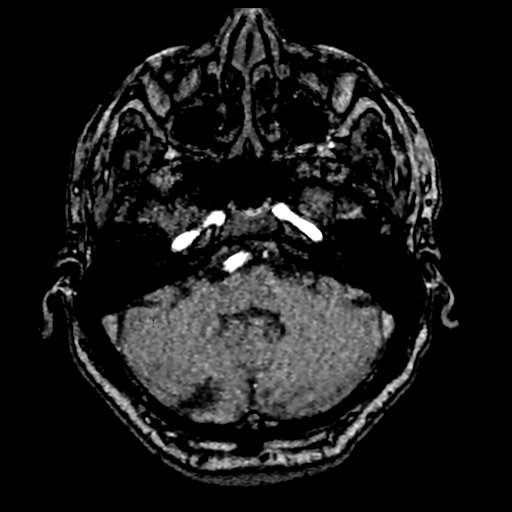
[im 89/205]
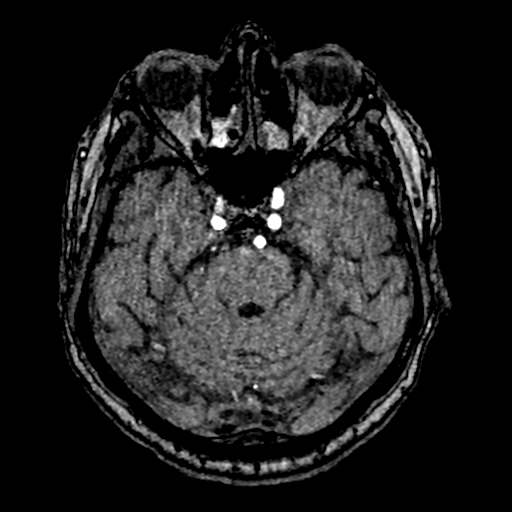
[im 103/205]
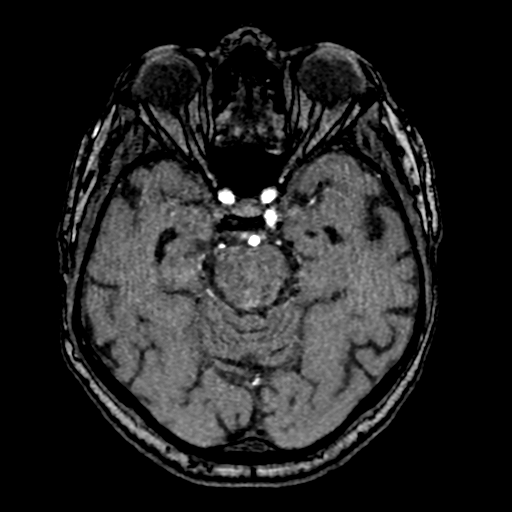
[im 116/205]
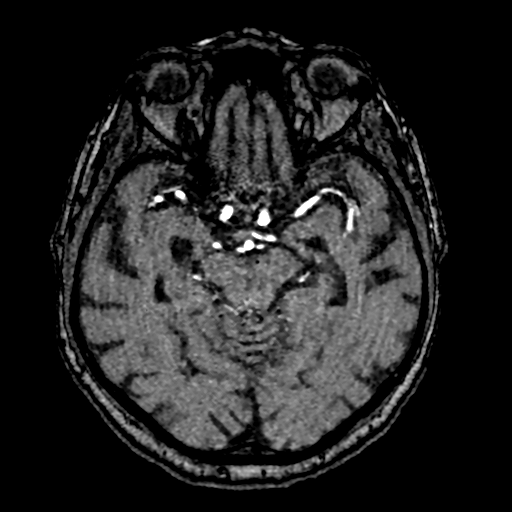
[im 142/205]
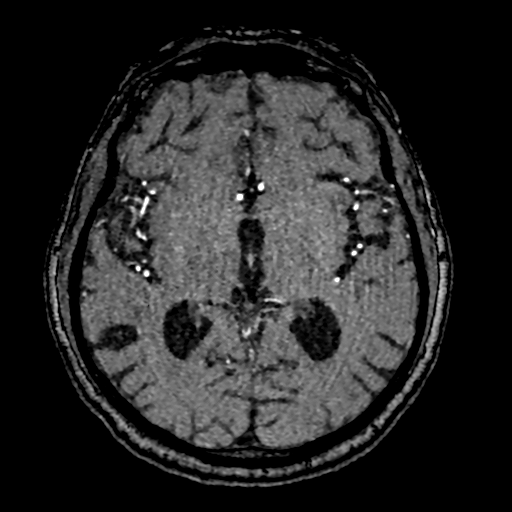
[im 169/205]
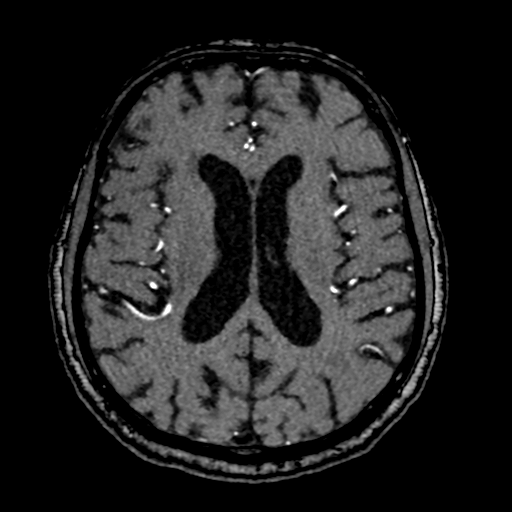
[im 173/205]
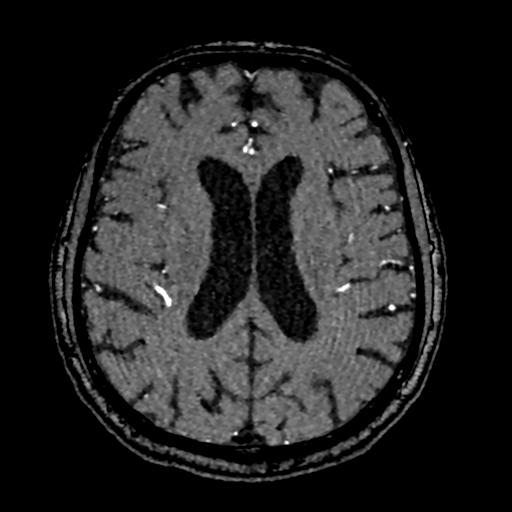
[im 196/205]
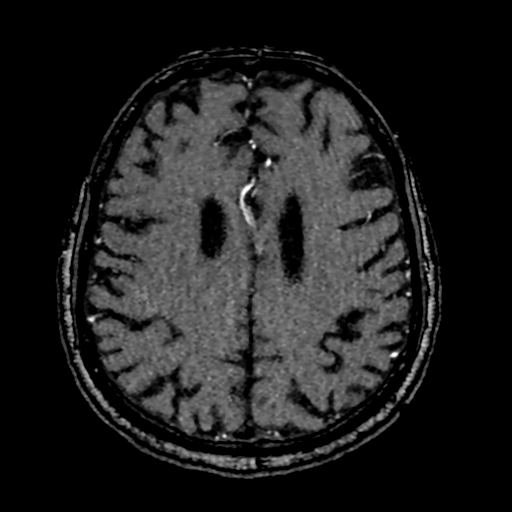

[Series 1064: l-r postcirc · axial · 0.5mm · 0.57mm/px · 1 of 1 slices shown]
[im 1/1]
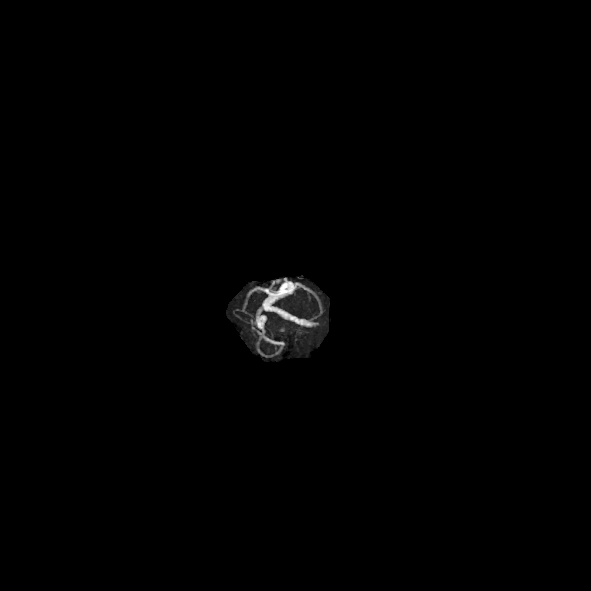

[19 of 48 positions shown; findings below may reference images not displayed]

FINDINGS: POSTERIOR CIRCULATION:

--Vertebral arteries: Normal

--Inferior cerebellar arteries: Normal.

--Basilar artery: Normal.

--Superior cerebellar arteries: Normal.

--Posterior cerebral arteries: Normal.

ANTERIOR CIRCULATION:

--Intracranial internal carotid arteries: Normal.

--Anterior cerebral arteries (ACA): Fusiform aneurysm at the
junction of the left A1 and A2 segments measures 3 x 4 mm. Otherwise
normal.

--Middle cerebral arteries (MCA): Normal.

ANATOMIC VARIANTS: None
IMPRESSION: 1. 3 x 4 mm fusiform aneurysm at the junction of the left A1 and A2
segments.
2. Otherwise normal intracranial MRA.

## 2021-07-26 ENCOUNTER — Other Ambulatory Visit: Payer: Self-pay | Admitting: Family

## 2021-07-27 ENCOUNTER — Other Ambulatory Visit: Payer: Self-pay

## 2021-07-27 DIAGNOSIS — R9389 Abnormal findings on diagnostic imaging of other specified body structures: Secondary | ICD-10-CM

## 2021-07-30 ENCOUNTER — Ambulatory Visit (INDEPENDENT_AMBULATORY_CARE_PROVIDER_SITE_OTHER): Payer: Medicare PPO | Admitting: Family

## 2021-07-30 ENCOUNTER — Encounter: Payer: Self-pay | Admitting: Family

## 2021-07-30 ENCOUNTER — Other Ambulatory Visit: Payer: Self-pay

## 2021-07-30 VITALS — BP 132/88 | HR 75 | Temp 97.8°F | Ht 70.0 in | Wt 163.9 lb

## 2021-07-30 DIAGNOSIS — R413 Other amnesia: Secondary | ICD-10-CM

## 2021-07-30 DIAGNOSIS — E7800 Pure hypercholesterolemia, unspecified: Secondary | ICD-10-CM | POA: Diagnosis not present

## 2021-07-30 DIAGNOSIS — I10 Essential (primary) hypertension: Secondary | ICD-10-CM | POA: Diagnosis not present

## 2021-07-30 MED ORDER — ROSUVASTATIN CALCIUM 5 MG PO TABS: 5.0000 mg | ORAL_TABLET | Freq: Every day | ORAL | 3 refills | Status: DC

## 2021-07-30 NOTE — Assessment & Plan Note (Signed)
Chronic, stable.  Continue amlodipine 10mg  , losartan 25mg , and toprol 50mg  ?

## 2021-07-30 NOTE — Patient Instructions (Addendum)
As discussed today, the MRA of your head showed an abnormality, possible aneurysm.  I placed a referral to neurosurgery to further evaluate for this.  The doctor's name and Dr Meade Maw. Please call the below number to schedule an appointment. ? ?[phone] (Work)   ? ?For the memory changes, we have also scheduled you an appointment with a neurologist in [place], Dr. Manuella Ghazi, which is scheduled for 09/23/21 at 44 AM.  ?[facility] ?[address] ?[place] [address] ? ?I have also started you on a cholesterol medication Crestor 5 mg.  In 6 weeks time I will have you back in for a lab draw to look at your liver enzymes. Please schedule this in our office.  ? ? In 3 to 4 months time, we will recheck your lipid panel to see if your cholesterol has decreased ?

## 2021-07-30 NOTE — Progress Notes (Signed)
? ?Subjective:  ? ? Patient ID: Joshua Sandoval, male    DOB: 12-09-1932, 88 y.o.   MRN: [id number] ? ?CC: Joshua Sandoval is a 88 y.o. male who presents today for follow up.  ? ?HPI: Feels well today ?No new complaints ? ? ? ?HTN- compliant with amlodipine 10mg  , losartan 25mg , and toprol 50mg  ? ?HLD- family h/o CVA, HTN.  ? ?Memory changes-upcoming appointment with Dr. Manuella Ghazi, neurology 09/23/2021 ? ?neurosurgery, Dr. Cari Caraway has not yet been scheduled ? ?HISTORY:  ?Past Medical History:  ?Diagnosis Date  ? Diastolic dysfunction   ? a. 12/2017 Echo: EF 50-55%, no rwma, Gr1 DD, nl RV fxn; b. 03/2020 Echo: EF 55-60%, no rwma, Gr1 DD, Mildly red RV fxn, triv MR, mild to mod AoV sclerosis w/o stenosis.  ? History of stress test   ? a. 01/2018 MV: EF 55%. No ischemia/infarct-->Low risk.  ? Hypertension   ? PVC's (premature ventricular contractions)   ? a. 02/2018 Holter: RSR, >7k PVCs (15% burden). 641 runs of NSVT up to 6 beats.  ? RBBB   ? ?Past Surgical History:  ?Procedure Laterality Date  ? NO PAST SURGERIES    ? ?Family History  ?Problem Relation Age of Onset  ? Hypertension Mother   ? Stroke Mother   ? ? ?Allergies: Other ?Current Outpatient Medications on File Prior to Visit  ?Medication Sig Dispense Refill  ? amLODipine (NORVASC) 10 MG tablet TAKE 1 TABLET(10 MG) BY MOUTH DAILY 90 tablet 2  ? aspirin EC 81 MG tablet Take 81 mg by mouth as needed.     ? losartan (COZAAR) 25 MG tablet Take 1 tablet (25 mg total) by mouth daily. 90 tablet 0  ? metoprolol succinate (TOPROL-XL) 50 MG 24 hr tablet TAKE 1 TABLET(50 MG) BY MOUTH DAILY WITH OR IMMEDIATELY FOLLOWING A MEAL 90 tablet 2  ? ?No current facility-administered medications on file prior to visit.  ? ? ?Social History  ? ?Tobacco Use  ? Smoking status: Former  ?  Types: Cigarettes  ?  Quit date: 65  ?  Years since quitting: 43.2  ? Smokeless tobacco: Never  ?Substance Use Topics  ? Alcohol use: Not Currently  ? Drug use: No  ? ? ?Review of Systems  ?Constitutional:  Negative  for chills and fever.  ?HENT:  Negative for congestion, ear pain, rhinorrhea, sinus pressure and sore throat.   ?Respiratory:  Negative for cough, shortness of breath and wheezing.   ?Cardiovascular:  Negative for chest pain and palpitations.  ?Gastrointestinal:  Negative for diarrhea, nausea and vomiting.  ?Genitourinary:  Negative for dysuria.  ?Musculoskeletal:  Negative for myalgias.  ?Skin:  Negative for rash.  ?Neurological:  Negative for headaches.  ?Hematological:  Negative for adenopathy.  ?   ?Objective:  ?  ?BP 132/88 (BP Location: Left Arm, Patient Position: Sitting, Cuff Size: Normal)   Pulse 75   Temp 97.8 ?F (36.6 ?C) (Oral)   Ht 5\' 10"  (1.778 m)   Wt 163 lb 14.4 oz (74.3 kg)   SpO2 98%   BMI 23.52 kg/m?  ?BP Readings from Last 3 Encounters:  ?07/30/21 132/88  ?07/02/21 (!) 160/70  ?06/23/21 128/78  ? ?Wt Readings from Last 3 Encounters:  ?07/30/21 163 lb 14.4 oz (74.3 kg)  ?07/02/21 157 lb 9.6 oz (71.5 kg)  ?06/30/21 160 lb (72.6 kg)  ? ? ?Physical Exam ?Vitals reviewed.  ?Constitutional:   ?   Appearance: He is well-developed.  ?Cardiovascular:  ?   Rate and  Rhythm: Regular rhythm.  ?   Heart sounds: Normal heart sounds.  ?Pulmonary:  ?   Effort: Pulmonary effort is normal. No respiratory distress.  ?   Breath sounds: Normal breath sounds. No wheezing, rhonchi or rales.  ?Skin: ?   General: Skin is warm and dry.  ?Neurological:  ?   Mental Status: He is alert.  ?Psychiatric:     ?   Speech: Speech normal.     ?   Behavior: Behavior normal.  ? ? ?   ?Assessment & Plan:  ? ?Problem List Items Addressed This Visit   ? ?  ? Cardiovascular and Mediastinum  ? Essential hypertension  ?  Chronic, stable.  Continue amlodipine 10mg  , losartan 25mg , and toprol 50mg  ?  ?  ? Relevant Medications  ? rosuvastatin (CRESTOR) 5 MG tablet  ?  ? Other  ? Memory changes  ?  He has upcoming appointment with Dr. Manuella Ghazi, neurology.  Strongly encouraged him to follow through with neurosurgery referral and I have given  him the phone number to schedule this.  He is amenable to doing this.   will follow ?  ?  ? Pure hypercholesterolemia - Primary  ?  Reviewed recent cholesterol panel.  We opted to start low-dose Crestor 5 mg with a goal of LDL to be closer to 100 particularly as with his  family history of CVA.  Start Crestor 5 mg with repeat CMP in 6 weeks time. ?  ?  ? Relevant Medications  ? rosuvastatin (CRESTOR) 5 MG tablet  ? Other Relevant Orders  ? Comprehensive metabolic panel  ? ? ? ?I am having Joshua Sandoval start on rosuvastatin. I am also having him maintain his aspirin EC, metoprolol succinate, amLODipine, and losartan. ? ? ?Meds ordered this encounter  ?Medications  ? rosuvastatin (CRESTOR) 5 MG tablet  ?  Sig: Take 1 tablet (5 mg total) by mouth daily.  ?  Dispense:  90 tablet  ?  Refill:  3  ?  Order Specific Question:   Supervising Provider  ?  Answer:   Crecencio Mc [[id number]]  ? ? ?Return precautions given.  ? ?Risks, benefits, and alternatives of the medications and treatment plan prescribed today were discussed, and patient expressed understanding.  ? ?Education regarding symptom management and diagnosis given to patient on AVS. ? ?Continue to follow with Burnard Hawthorne, FNP for routine health maintenance.  ? ?Joshua Sandoval and I agreed with plan.  ? ?Mable Paris, FNP ? ? ?

## 2021-07-30 NOTE — Assessment & Plan Note (Signed)
He has upcoming appointment with Dr. Sherryll Burger, neurology.  Strongly encouraged him to follow through with neurosurgery referral and I have given him the phone number to schedule this.  He is amenable to doing this.   will follow ?

## 2021-07-30 NOTE — Assessment & Plan Note (Signed)
Reviewed recent cholesterol panel.  We opted to start low-dose Crestor 5 mg with a goal of LDL to be closer to 100 particularly as with his  family history of CVA.  Start Crestor 5 mg with repeat CMP in 6 weeks time. ?

## 2021-08-31 DIAGNOSIS — I671 Cerebral aneurysm, nonruptured: Secondary | ICD-10-CM | POA: Diagnosis not present

## 2021-09-06 ENCOUNTER — Encounter: Payer: Self-pay | Admitting: Family

## 2021-09-06 DIAGNOSIS — I671 Cerebral aneurysm, nonruptured: Secondary | ICD-10-CM | POA: Insufficient documentation

## 2021-10-29 ENCOUNTER — Other Ambulatory Visit: Payer: Self-pay | Admitting: Neurosurgery

## 2021-10-29 DIAGNOSIS — I671 Cerebral aneurysm, nonruptured: Secondary | ICD-10-CM

## 2021-11-30 ENCOUNTER — Ambulatory Visit
Admission: RE | Admit: 2021-11-30 | Discharge: 2021-11-30 | Disposition: A | Discharge status: Home / Self Care | Payer: Medicare PPO | Source: Ambulatory Visit | Attending: Neurosurgery | Admitting: Neurosurgery

## 2021-11-30 DIAGNOSIS — I671 Cerebral aneurysm, nonruptured: Secondary | ICD-10-CM | POA: Insufficient documentation

## 2021-12-09 NOTE — Progress Notes (Signed)
Hello [name],  Per Dr Myer Haff: "His MR angiogram is stable.  There is no need for further follow-up. "  Please contact us if you need anything in the future.  Take care, Joshua Cowper, RN Neurosurgery

## 2021-12-15 NOTE — Progress Notes (Unsigned)
Follow-up Outpatient Visit Date: 12/16/2021  Primary Care Provider: Allegra Grana, FNP [address] [place] [address]  Chief Complaint: Finger numbness  HPI:  [name] is a 88 y.o. male with history of hypertension, frequent PVCs, and right bundle branch block, who presents for follow-up of PVC's and hypertension.  He was last seen in our office in February by Ward Givens, NP, at which thime he continued to feel well.  He was exercising regularly at the [facility] without limitations.  He was again referred for myocardiacl perfusion stress test but did not move forward with this (he has cancelled this on multiple occasions).  Today, Mr. Isip reports that he has been feeling well.  His only complaint is of intermittent numbness in his fingers, which seems to migrate between both hands.  He notices it most when he tries to tie a tie.  He denies trauma or other obvious cause for his numbness.  He denies chest pain, shortness of breath, palpitations, lightheadedness, and edema.  He continues to exercise (primarily weight lifting) on a regular basis without any limitations.  His wife check his BP's occasionally at home, though he does not recall and specific readings.  He is tolerating his medications well.  --------------------------------------------------------------------------------------------------  Past Medical History:  Diagnosis Date   Diastolic dysfunction    a. 12/2017 Echo: EF 50-55%, no rwma, Gr1 DD, nl RV fxn; b. 03/2020 Echo: EF 55-60%, no rwma, Gr1 DD, Mildly red RV fxn, triv MR, mild to mod AoV sclerosis w/o stenosis.   History of stress test    a. 01/2018 MV: EF 55%. No ischemia/infarct-->Low risk.   Hypertension    PVC's (premature ventricular contractions)    a. 02/2018 Holter: RSR, >7k PVCs (15% burden). 641 runs of NSVT up to 6 beats.   RBBB    Past Surgical History:  Procedure Laterality Date   NO PAST SURGERIES      Current Meds  Medication Sig    amLODipine (NORVASC) 10 MG tablet TAKE 1 TABLET(10 MG) BY MOUTH DAILY   aspirin EC 81 MG tablet Take 81 mg by mouth as needed.    losartan (COZAAR) 25 MG tablet Take 1 tablet (25 mg total) by mouth daily.   metoprolol succinate (TOPROL-XL) 50 MG 24 hr tablet TAKE 1 TABLET(50 MG) BY MOUTH DAILY WITH OR IMMEDIATELY FOLLOWING A MEAL   rosuvastatin (CRESTOR) 5 MG tablet Take 1 tablet (5 mg total) by mouth daily.    Allergies: Other  Social History   Tobacco Use   Smoking status: Former    Types: Cigarettes    Quit date: 1980    Years since quitting: 43.6   Smokeless tobacco: Never  Vaping Use   Vaping Use: Never used  Substance Use Topics   Alcohol use: Not Currently   Drug use: No    Family History  Problem Relation Age of Onset   Hypertension Mother    Stroke Mother     Review of Systems: A 12-system review of systems was performed and was negative except as noted in the HPI.  --------------------------------------------------------------------------------------------------  Physical Exam: BP (!) 150/80 (BP Location: Left Arm, Patient Position: Sitting, Cuff Size: Normal)   Pulse 73   Ht 5\' 6"  (1.676 m)   Wt 160 lb (72.6 kg)   SpO2 99%   BMI 25.82 kg/m  Repeat BP: 136/70  General:  NAD. Neck: No JVD or HJR. Lungs: Clear to auscultation bilaterally without wheezes or crackles. Heart: Regular  rate and rhythm with frequent extrasystoles.  No murmurs. Abdomen: Soft, nontender, nondistended. Extremities: No lower extremity edema.  EKG:  Normal sinus rhythm with frequent PVC's, LAD, and RBBB.  No significant change from prior tracing on 06/23/2021.  Lab Results  Component Value Date   WBC 3.9 07/02/2021   HGB 14.8 07/02/2021   HCT 45.0 07/02/2021   MCV 84.4 07/02/2021   PLT 149 07/02/2021    Lab Results  Component Value Date   NA 138 07/16/2021   K 4.5 07/16/2021   CL 101 07/16/2021   CO2 29 07/16/2021   BUN 15 07/16/2021   CREATININE 1.02 07/16/2021    GLUCOSE 99 07/16/2021   ALT 12 07/02/2021    Lab Results  Component Value Date   CHOL 251 (H) 07/16/2021   HDL 59.90 07/16/2021   LDLCALC 179 (H) 07/16/2021   LDLDIRECT 150 (H) 01/29/2018   TRIG 60.0 07/16/2021   CHOLHDL 4 07/16/2021    --------------------------------------------------------------------------------------------------  ASSESSMENT AND PLAN: PVC's: Frequent PVC's again noted on today's EKG without associated symptoms.  LVEF was normal by echo in 2021.  We have tried to arrange for ischemia evaluation multiple times, though Mr. Mcbryar has always cancelled.  He does not wish to pursue ischemia testing at this time given his lack of symptoms.  We will continue his current medications, including metoprolol succinate 50 mg daily.  Hypertension: BP mildly elevated on initial check but improved on recheck.  Continue current doses of amlodipine, losartan, and metoprolol.  Hyperlipidemia: Continue rosuvastatin and aspirin, with ongoing follow-up/management per Ms. Arnett.  Finger numbness: Ongoing issue dating back at least a year.  I suggested that Mr. Vandevoort try wearing a brace that he can purchase over-the-counter at night.  If symptoms worsen/persist, he should follow-up with his PCP and/or consider further evaluation by a hand specialist.  Follow-up: Return to clinic in 6 months.  Yvonne Kendall, MD 12/16/2021 10:17 AM

## 2021-12-16 ENCOUNTER — Encounter: Payer: Self-pay | Admitting: Internal Medicine

## 2021-12-16 ENCOUNTER — Ambulatory Visit: Payer: Medicare PPO | Admitting: Internal Medicine

## 2021-12-16 VITALS — BP 150/80 | HR 73 | Ht 66.0 in | Wt 160.0 lb

## 2021-12-16 DIAGNOSIS — I10 Essential (primary) hypertension: Secondary | ICD-10-CM

## 2021-12-16 DIAGNOSIS — E785 Hyperlipidemia, unspecified: Secondary | ICD-10-CM

## 2021-12-16 DIAGNOSIS — R200 Anesthesia of skin: Secondary | ICD-10-CM

## 2021-12-16 DIAGNOSIS — I493 Ventricular premature depolarization: Secondary | ICD-10-CM

## 2021-12-16 NOTE — Patient Instructions (Signed)

## 2022-03-03 ENCOUNTER — Ambulatory Visit (INDEPENDENT_AMBULATORY_CARE_PROVIDER_SITE_OTHER): Payer: Medicare PPO

## 2022-03-03 VITALS — BP 138/86 | HR 60 | Temp 98.5°F | Resp 16 | Ht 70.0 in | Wt 162.8 lb

## 2022-03-03 DIAGNOSIS — Z0000 Encounter for general adult medical examination without abnormal findings: Secondary | ICD-10-CM | POA: Diagnosis not present

## 2022-03-03 NOTE — Progress Notes (Signed)
Subjective:   Joshua Sandoval is a 88 y.o. male who presents for Medicare Annual/Subsequent preventive examination.  Review of Systems    No ROS.  Medicare Wellness    Cardiac Risk Factors include: advanced age (>53men, >42 women);male gender     Objective:    Today's Vitals   03/03/22 1038  BP: 138/86  Pulse: 60  Resp: 16  Temp: 98.5 F (36.9 C)  SpO2: 98%  Weight: 162 lb 12.8 oz (73.8 kg)  Height: 5\' 10"  (1.778 m)   Body mass index is 23.36 kg/m.     03/03/2022   10:46 AM 03/01/2021    1:31 PM 02/27/2020    1:15 PM 02/26/2019    1:18 PM 02/23/2018    9:16 AM  Advanced Directives  Does Patient Have a Medical Advance Directive? No No No No No  Would patient like information on creating a medical advance directive? Yes (MAU/Ambulatory/Procedural Areas - Information given) No - Patient declined No - Patient declined No - Patient declined     Current Medications (verified) Outpatient Encounter Medications as of 03/03/2022  Medication Sig   amLODipine (NORVASC) 10 MG tablet TAKE 1 TABLET(10 MG) BY MOUTH DAILY   aspirin EC 81 MG tablet Take 81 mg by mouth as needed.    losartan (COZAAR) 25 MG tablet Take 1 tablet (25 mg total) by mouth daily.   metoprolol succinate (TOPROL-XL) 50 MG 24 hr tablet TAKE 1 TABLET(50 MG) BY MOUTH DAILY WITH OR IMMEDIATELY FOLLOWING A MEAL   rosuvastatin (CRESTOR) 5 MG tablet Take 1 tablet (5 mg total) by mouth daily.   No facility-administered encounter medications on file as of 03/03/2022.    Allergies (verified) Other   History: Past Medical History:  Diagnosis Date   Diastolic dysfunction    a. 12/2017 Echo: EF 50-55%, no rwma, Gr1 DD, nl RV fxn; b. 03/2020 Echo: EF 55-60%, no rwma, Gr1 DD, Mildly red RV fxn, triv MR, mild to mod AoV sclerosis w/o stenosis.   History of stress test    a. 01/2018 MV: EF 55%. No ischemia/infarct-->Low risk.   Hypertension    PVC's (premature ventricular contractions)    a. 02/2018 Holter: RSR, >7k PVCs  (15% burden). 641 runs of NSVT up to 6 beats.   RBBB    Past Surgical History:  Procedure Laterality Date   NO PAST SURGERIES     Family History  Problem Relation Age of Onset   Hypertension Mother    Stroke Mother    Social History   Socioeconomic History   Marital status: Married    Spouse name: Not on file   Number of children: Not on file   Years of education: Not on file   Highest education level: Not on file  Occupational History   Not on file  Tobacco Use   Smoking status: Former    Types: Cigarettes    Quit date: 1980    Years since quitting: 43.8   Smokeless tobacco: Never  Vaping Use   Vaping Use: Never used  Substance and Sexual Activity   Alcohol use: Not Currently   Drug use: No   Sexual activity: Yes    Partners: Female  Other Topics Concern   Not on file  Social History Narrative   Not on file   Social Determinants of Health   Financial Resource Strain: Low Risk  (03/03/2022)   Overall Financial Resource Strain (CARDIA)    Difficulty of Paying Living Expenses: Not hard at all  Food Insecurity: No Food Insecurity (03/03/2022)   Hunger Vital Sign    Worried About Running Out of Food in the Last Year: Never true    Ran Out of Food in the Last Year: Never true  Transportation Needs: No Transportation Needs (03/03/2022)   PRAPARE - Administrator, Civil Service (Medical): No    Lack of Transportation (Non-Medical): No  Physical Activity: Insufficiently Active (03/03/2022)   Exercise Vital Sign    Days of Exercise per Week: 2 days    Minutes of Exercise per Session: 50 min  Stress: No Stress Concern Present (03/03/2022)   [employer] of Occupational Health - Occupational Stress Questionnaire    Feeling of Stress : Not at all  Social Connections: Unknown (03/03/2022)   Social Connection and Isolation Panel [NHANES]    Frequency of Communication with Friends and Family: More than three times a week    Frequency of Social  Gatherings with Friends and Family: More than three times a week    Attends Religious Services: Not on Marketing executive or Organizations: Not on file    Attends Banker Meetings: Not on file    Marital Status: Not on file    Tobacco Counseling Counseling given: Not Answered   Clinical Intake:  Pre-visit preparation completed: Yes        Diabetes: No  How often do you need to have someone help you when you read instructions, pamphlets, or other written materials from your doctor or pharmacy?: 1 - Never   Interpreter Needed?: No    Activities of Daily Living    03/03/2022   10:46 AM  In your present state of health, do you have any difficulty performing the following activities:  Hearing? 0  Vision? 0  Difficulty concentrating or making decisions? 0  Walking or climbing stairs? 0  Dressing or bathing? 0  Doing errands, shopping? 0  Preparing Food and eating ? N  Using the Toilet? N  In the past six months, have you accidently leaked urine? N  Do you have problems with loss of bowel control? N  Managing your Medications? N  Managing your Finances? N  Housekeeping or managing your Housekeeping? N    Patient Care Team: Allegra Grana, FNP as PCP - General (Family Medicine) End, Cristal Deer, MD as PCP - Cardiology (Cardiology)  Indicate any recent Medical Services you may have received from other than [facility] providers in the past year (date may be approximate).     Assessment:   This is a routine wellness examination for Joshua Sandoval.  Hearing/Vision screen Hearing Screening - Comments:: Patient is able to hear conversational tones without difficulty. No issues reported Vision Screening - Comments:: Followed by [facility]  Wears corrective lenses  They have seen their ophthalmologist in the last 12 months  Dietary issues and exercise activities discussed: Current Exercise Habits: Home exercise routine, Intensity: Mild Healthy  diet Good water intake    Goals Addressed               This Visit's Progress     Patient Stated     Increase physical activity (pt-stated)        I want to start lifting weights again.       Depression Screen    03/03/2022   10:45 AM 07/30/2021    4:00 PM 07/02/2021    2:30 PM 03/01/2021    1:29 PM 02/27/2020    1:14  PM 02/26/2019    1:20 PM 09/14/2016    9:18 AM  PHQ 2/9 Scores  PHQ - 2 Score 0 0 3 0 0 0 0  PHQ- 9 Score  0 7    0    Fall Risk    03/03/2022   10:46 AM 07/30/2021    3:59 PM 07/30/2021    3:31 PM 07/02/2021    2:29 PM 07/02/2021    1:48 PM  Fall Risk   Falls in the past year? 0 0 0 1 0  Number falls in past yr: 0 0 0 1 0  Injury with Fall? 0 0 0 0 0  Risk for fall due to : No Fall Risks No Fall Risks No Fall Risks Impaired vision;Impaired mobility No Fall Risks  Follow up Falls evaluation completed Falls evaluation completed Falls evaluation completed Falls evaluation completed Falls evaluation completed    [place]: Home free of loose throw rugs in walkways, pet beds, electrical cords, etc? Yes  Adequate lighting in your home to reduce risk of falls? Yes   ASSISTIVE DEVICES UTILIZED TO PREVENT FALLS: Life alert? No  Use of a cane, walker or w/c? No  Grab bars in the bathroom? No   TIMED UP AND GO: Was the test performed? Yes .  Length of time to ambulate 10 feet: 10 sec.   Gait steady and fast without use of assistive device  Cognitive Function:        03/03/2022   11:15 AM 03/01/2021    1:39 PM 02/26/2019    1:22 PM  6CIT Screen  What Year? 0 points 0 points 0 points  What month? 0 points 0 points 0 points  What time? 0 points 0 points 0 points  Count back from 20 0 points  0 points  Months in reverse 0 points  0 points  Repeat phrase 0 points  2 points  Total Score 0 points  2 points    Immunizations Immunization History  Administered Date(s) Administered   Fluad Quad(high Dose 65+)  02/27/2019, 02/22/2022   Influenza, High Dose Seasonal PF 02/15/2021   Influenza,inj,Quad PF,6+ Mos 03/07/2018   Moderna Sars-Covid-2 Vaccination 06/24/2019, 07/24/2019   Pneumococcal Conjugate-13 01/01/2019    TDAP status: Due, Education has been provided regarding the importance of this vaccine. Advised may receive this vaccine at local pharmacy or Health Dept. Aware to provide a copy of the vaccination record if obtained from local pharmacy or Health Dept. Verbalized acceptance and understanding.  Covid-19 vaccine status: Completed vaccines  Screening Tests Health Maintenance  Topic Date Due   TETANUS/TDAP  Never done   COVID-19 Vaccine (3 - Moderna series) 03/19/2022 (Originally 09/18/2019)   Pneumonia Vaccine 108+ Years old (2 - PPSV23 or PCV20) 07/02/2022 (Originally 01/01/2020)   Zoster Vaccines- Shingrix (1 of 2) 10/05/2022 (Originally 10/21/1984)   INFLUENZA VACCINE  Completed   HPV VACCINES  Aged Out    Health Maintenance Health Maintenance Due  Topic Date Due   TETANUS/TDAP  Never done   Lung Cancer Screening: (Low Dose CT Chest recommended if Age 35-80 years, 30 pack-year currently smoking OR have quit w/in 15years.) does not qualify.   Hepatitis C Screening: does not qualify.  Vision Screening: Recommended annual ophthalmology exams for early detection of glaucoma and other disorders of the eye.  Dental Screening: Recommended annual dental exams for proper oral hygiene. Dentures.   Community Resource Referral / Chronic Care Management: CRR required this visit?  No   CCM required this visit?  No      Plan:     I have personally reviewed and noted the following in the patient's chart:   Medical and social history Use of alcohol, tobacco or illicit drugs  Current medications and supplements including opioid prescriptions. Patient is not currently taking opioid prescriptions. Functional ability and status Nutritional status Physical activity Advanced  directives List of other physicians Hospitalizations, surgeries, and ER visits in previous 12 months Vitals Screenings to include cognitive, depression, and falls Referrals and appointments  In addition, I have reviewed and discussed with patient certain preventive protocols, quality metrics, and best practice recommendations. A written personalized care plan for preventive services as well as general preventive health recommendations were provided to patient.     Cathey Endow, LPN   [id number]

## 2022-03-03 NOTE — Patient Instructions (Addendum)
Joshua Sandoval , Thank you for taking time to come for your Medicare Wellness Visit. I appreciate your ongoing commitment to your health goals. Please review the following plan we discussed and let me know if I can assist you in the future.   These are the goals we discussed:  Goals       Patient Stated     Increase physical activity (pt-stated)      I want to start lifting weights again.        This is a list of the screening recommended for you and due dates:  Health Maintenance  Topic Date Due   Tetanus Vaccine  Never done   COVID-19 Vaccine (3 - Moderna series) 03/19/2022*   Pneumonia Vaccine (2 - PPSV23 or PCV20) 07/02/2022*   Zoster (Shingles) Vaccine (1 of 2) 10/05/2022*   Flu Shot  Completed   HPV Vaccine  Aged Out  *Topic was postponed. The date shown is not the original due date.    Advanced directives: End of life planning; Advance aging; Advanced directives discussed.  Copy of current HCPOA/Living Will requested upon completion.    Conditions/risks identified: none new  Next appointment: Follow up in one year for your annual wellness visit.   Preventive Care 36 Years and Older, Male  Preventive care refers to lifestyle choices and visits with your health care provider that can promote health and wellness. What does preventive care include? A yearly physical exam. This is also called an annual well check. Dental exams once or twice a year. Routine eye exams. Ask your health care provider how often you should have your eyes checked. Personal lifestyle choices, including: Daily care of your teeth and gums. Regular physical activity. Eating a healthy diet. Avoiding tobacco and drug use. Limiting alcohol use. Practicing safe sex. Taking low doses of aspirin every day. Taking vitamin and mineral supplements as recommended by your health care provider. What happens during an annual well check? The services and screenings done by your health care provider during your  annual well check will depend on your age, overall health, lifestyle risk factors, and family history of disease. Counseling  Your health care provider may ask you questions about your: Alcohol use. Tobacco use. Drug use. Emotional well-being. Home and relationship well-being. Sexual activity. Eating habits. History of falls. Memory and ability to understand (cognition). Work and work Statistician. Screening  You may have the following tests or measurements: Height, weight, and BMI. Blood pressure. Lipid and cholesterol levels. These may be checked every 5 years, or more frequently if you are over 44 years old. Skin check. Lung cancer screening. You may have this screening every year starting at age 67 if you have a 30-pack-year history of smoking and currently smoke or have quit within the past 15 years. Fecal occult blood test (FOBT) of the stool. You may have this test every year starting at age 54. Flexible sigmoidoscopy or colonoscopy. You may have a sigmoidoscopy every 5 years or a colonoscopy every 10 years starting at age 15. Prostate cancer screening. Recommendations will vary depending on your family history and other risks. Hepatitis C blood test. Hepatitis B blood test. Sexually transmitted disease (STD) testing. Diabetes screening. This is done by checking your blood sugar (glucose) after you have not eaten for a while (fasting). You may have this done every 1-3 years. Abdominal aortic aneurysm (AAA) screening. You may need this if you are a current or former smoker. Osteoporosis. You may be screened starting at  age 20 if you are at high risk. Talk with your health care provider about your test results, treatment options, and if necessary, the need for more tests. Vaccines  Your health care provider may recommend certain vaccines, such as: Influenza vaccine. This is recommended every year. Tetanus, diphtheria, and acellular pertussis (Tdap, Td) vaccine. You may need a Td  booster every 10 years. Zoster vaccine. You may need this after age 64. Pneumococcal 13-valent conjugate (PCV13) vaccine. One dose is recommended after age 35. Pneumococcal polysaccharide (PPSV23) vaccine. One dose is recommended after age 10. Talk to your health care provider about which screenings and vaccines you need and how often you need them. This information is not intended to replace advice given to you by your health care provider. Make sure you discuss any questions you have with your health care provider. Document Released: 05/29/2015 Document Revised: 01/20/2016 Document Reviewed: 03/03/2015 Elsevier Interactive Patient Education  2017 [place] Prevention in the Home Falls can cause injuries. They can happen to people of all ages. There are many things you can do to make your home safe and to help prevent falls. What can I do on the outside of my home? Regularly fix the edges of walkways and driveways and fix any cracks. Remove anything that might make you trip as you walk through a door, such as a raised step or threshold. Trim any bushes or trees on the path to your home. Use bright outdoor lighting. Clear any walking paths of anything that might make someone trip, such as rocks or tools. Regularly check to see if handrails are loose or broken. Make sure that both sides of any steps have handrails. Any raised decks and porches should have guardrails on the edges. Have any leaves, snow, or ice cleared regularly. Use sand or salt on walking paths during winter. Clean up any spills in your garage right away. This includes oil or grease spills. What can I do in the bathroom? Use night lights. Install grab bars by the toilet and in the tub and shower. Do not use towel bars as grab bars. Use non-skid mats or decals in the tub or shower. If you need to sit down in the shower, use a plastic, non-slip stool. Keep the floor dry. Clean up any water that spills on the floor  as soon as it happens. Remove soap buildup in the tub or shower regularly. Attach bath mats securely with double-sided non-slip rug tape. Do not have throw rugs and other things on the floor that can make you trip. What can I do in the bedroom? Use night lights. Make sure that you have a light by your bed that is easy to reach. Do not use any sheets or blankets that are too big for your bed. They should not hang down onto the floor. Have a firm chair that has side arms. You can use this for support while you get dressed. Do not have throw rugs and other things on the floor that can make you trip. What can I do in the kitchen? Clean up any spills right away. Avoid walking on wet floors. Keep items that you use a lot in easy-to-reach places. If you need to reach something above you, use a strong step stool that has a grab bar. Keep electrical cords out of the way. Do not use floor polish or wax that makes floors slippery. If you must use wax, use non-skid floor wax. Do not have throw rugs and  other things on the floor that can make you trip. What can I do with my stairs? Do not leave any items on the stairs. Make sure that there are handrails on both sides of the stairs and use them. Fix handrails that are broken or loose. Make sure that handrails are as long as the stairways. Check any carpeting to make sure that it is firmly attached to the stairs. Fix any carpet that is loose or worn. Avoid having throw rugs at the top or bottom of the stairs. If you do have throw rugs, attach them to the floor with carpet tape. Make sure that you have a light switch at the top of the stairs and the bottom of the stairs. If you do not have them, ask someone to add them for you. What else can I do to help prevent falls? Wear shoes that: Do not have high heels. Have rubber bottoms. Are comfortable and fit you well. Are closed at the toe. Do not wear sandals. If you use a stepladder: Make sure that it is  fully opened. Do not climb a closed stepladder. Make sure that both sides of the stepladder are locked into place. Ask someone to hold it for you, if possible. Clearly mark and make sure that you can see: Any grab bars or handrails. First and last steps. Where the edge of each step is. Use tools that help you move around (mobility aids) if they are needed. These include: Canes. Walkers. Scooters. Crutches. Turn on the lights when you go into a dark area. Replace any light bulbs as soon as they burn out. Set up your furniture so you have a clear path. Avoid moving your furniture around. If any of your floors are uneven, fix them. If there are any pets around you, be aware of where they are. Review your medicines with your doctor. Some medicines can make you feel dizzy. This can increase your chance of falling. Ask your doctor what other things that you can do to help prevent falls. This information is not intended to replace advice given to you by your health care provider. Make sure you discuss any questions you have with your health care provider. Document Released: 02/26/2009 Document Revised: 10/08/2015 Document Reviewed: 06/06/2014 Elsevier Interactive Patient Education  2017 [employer].

## 2022-03-07 ENCOUNTER — Other Ambulatory Visit: Payer: Self-pay | Admitting: Internal Medicine

## 2022-03-28 ENCOUNTER — Other Ambulatory Visit: Payer: Self-pay | Admitting: Internal Medicine

## 2022-04-04 ENCOUNTER — Ambulatory Visit: Payer: Medicare PPO | Admitting: Family

## 2022-04-04 ENCOUNTER — Ambulatory Visit (INDEPENDENT_AMBULATORY_CARE_PROVIDER_SITE_OTHER): Payer: Medicare PPO

## 2022-04-04 ENCOUNTER — Encounter: Payer: Self-pay | Admitting: Family

## 2022-04-04 VITALS — BP 132/84 | HR 68 | Temp 98.7°F | Wt 163.8 lb

## 2022-04-04 DIAGNOSIS — I10 Essential (primary) hypertension: Secondary | ICD-10-CM | POA: Diagnosis not present

## 2022-04-04 DIAGNOSIS — M25512 Pain in left shoulder: Secondary | ICD-10-CM | POA: Diagnosis not present

## 2022-04-04 DIAGNOSIS — G8929 Other chronic pain: Secondary | ICD-10-CM | POA: Diagnosis not present

## 2022-04-04 DIAGNOSIS — R200 Anesthesia of skin: Secondary | ICD-10-CM

## 2022-04-04 DIAGNOSIS — M19012 Primary osteoarthritis, left shoulder: Secondary | ICD-10-CM | POA: Diagnosis not present

## 2022-04-04 DIAGNOSIS — M85812 Other specified disorders of bone density and structure, left shoulder: Secondary | ICD-10-CM | POA: Diagnosis not present

## 2022-04-04 LAB — POCT GLYCOSYLATED HEMOGLOBIN (HGB A1C): Hemoglobin A1C: 6.3 % — AB (ref 4.0–5.6)

## 2022-04-04 MED ORDER — GABAPENTIN 100 MG PO CAPS: 100.0000 mg | ORAL_CAPSULE | Freq: Every day | ORAL | 3 refills | Status: DC

## 2022-04-04 NOTE — Progress Notes (Signed)
Subjective:    Patient ID: Joshua Sandoval, male    DOB: 12-05-1932, 88 y.o.   MRN: [id number]  CC: Joshua Sandoval is a 88 y.o. male who presents today for follow up.   HPI: Complains of episodic bilateral fingertips ( except thumbs)  numbness x 2-3 years, unchanged. No numbness They will alternate.  Hard to button shirt.  No swelling, injury, neck pain, arm numbness  He also complains of left lateral shoulder pain.Episodic.   Works on [employer]. Pain when raising arm or putting his shirt on.   No cp, sob.   Works as school crossing guard  B12 414   Hypertension-he is compliant with losartan 25 mg daily, metoprolol succinate 50 mg daily, amlodipine 10 mg QD  HLD- he is not taking crestor  Prediabetes HISTORY:  Past Medical History:  Diagnosis Date   Diastolic dysfunction    a. 12/2017 Echo: EF 50-55%, no rwma, Gr1 DD, nl RV fxn; b. 03/2020 Echo: EF 55-60%, no rwma, Gr1 DD, Mildly red RV fxn, triv MR, mild to mod AoV sclerosis w/o stenosis.   History of stress test    a. 01/2018 MV: EF 55%. No ischemia/infarct-->Low risk.   Hypertension    PVC's (premature ventricular contractions)    a. 02/2018 Holter: RSR, >7k PVCs (15% burden). 641 runs of NSVT up to 6 beats.   RBBB    Past Surgical History:  Procedure Laterality Date   NO PAST SURGERIES     Family History  Problem Relation Age of Onset   Hypertension Mother    Stroke Mother     Allergies: Other Current Outpatient Medications on File Prior to Visit  Medication Sig Dispense Refill   amLODipine (NORVASC) 10 MG tablet Take 1 tablet (10 mg total) by mouth daily. PLEASE SCHEDULE OFFICE VISIT FOR FURTHER REFILLS. THANK YOU! 90 tablet 0   aspirin EC 81 MG tablet Take 81 mg by mouth as needed.      losartan (COZAAR) 25 MG tablet Take 1 tablet (25 mg total) by mouth daily. 90 tablet 0   metoprolol succinate (TOPROL-XL) 50 MG 24 hr tablet TAKE 1 TABLET(50 MG) BY MOUTH DAILY WITH OR IMMEDIATELY FOLLOWING A MEAL 90 tablet 0    rosuvastatin (CRESTOR) 5 MG tablet Take 1 tablet (5 mg total) by mouth daily. 90 tablet 3   No current facility-administered medications on file prior to visit.    Social History   Tobacco Use   Smoking status: Former    Types: Cigarettes    Quit date: 1980    Years since quitting: 43.9   Smokeless tobacco: Never  Vaping Use   Vaping Use: Never used  Substance Use Topics   Alcohol use: Not Currently   Drug use: No    Review of Systems  Constitutional:  Negative for chills and fever.  Eyes:  Negative for visual disturbance.  Respiratory:  Negative for cough.   Cardiovascular:  Negative for chest pain and palpitations.  Gastrointestinal:  Negative for nausea and vomiting.  Musculoskeletal:  Positive for arthralgias.  Neurological:  Positive for numbness. Negative for dizziness and headaches.      Objective:    BP 132/84 (BP Location: Left Arm, Patient Position: Sitting, Cuff Size: Normal)   Pulse 68   Temp 98.7 F (37.1 C) (Oral)   Wt 163 lb 12.8 oz (74.3 kg)   SpO2 97%   BMI 23.50 kg/m  BP Readings from Last 3 Encounters:  04/04/22 132/84  03/03/22 138/86  12/16/21 (!) 150/80   Wt Readings from Last 3 Encounters:  04/04/22 163 lb 12.8 oz (74.3 kg)  03/03/22 162 lb 12.8 oz (73.8 kg)  12/16/21 160 lb (72.6 kg)    Physical Exam Vitals reviewed.  Constitutional:      Appearance: He is well-developed.  Cardiovascular:     Rate and Rhythm: Regular rhythm.     Heart sounds: Normal heart sounds.  Pulmonary:     Effort: Pulmonary effort is normal. No respiratory distress.     Breath sounds: Normal breath sounds. No wheezing, rhonchi or rales.  Musculoskeletal:     Left shoulder: No swelling or bony tenderness. Decreased range of motion.     Comments: Left Shoulder:   No asymmetry of shoulders when comparing right and left.No pain with palpation over glenohumeral joint lines, [place] joint, AC joint, or bicipital groove. No pain with internal and external rotation. No  pain with resisted lateral extension . Decreased ROM with lateral raise.    Negative active painful arc sign.  Strength and sensation normal BUE's. Grip strength normal.    Skin:    General: Skin is warm and dry.  Neurological:     Mental Status: He is alert.  Psychiatric:        Speech: Speech normal.        Behavior: Behavior normal.        Assessment & Plan:   Problem List Items Addressed This Visit       Cardiovascular and Mediastinum   Essential hypertension    Chronic, stable.  Continue losartan 25 mg daily, metoprolol succinate 50 mg daily, amlodipine 10 mg QD      Relevant Orders   POCT HgB A1C (Completed)     Other   Finger numbness    Idiopathic.  ? CTS . Patient A1c well controlled.  B12 is optimal.  No neck pain at this time so deferred imaging of cervical spine.  We will trial gabapentin with close follow-up.  Counseled on side effects of sedation      Left shoulder pain - Primary    Presentation consistent with shoulder impingement.  Pending x-ray of and provided patient with exercises to start at home.  Trial gabapentin.  If no improvement, consider physical therapy, orthopedic consult      Relevant Medications   gabapentin (NEURONTIN) 100 MG capsule   Other Relevant Orders   DG Shoulder Left     I am having Joshua Sandoval start on gabapentin. I am also having him maintain his aspirin EC, losartan, rosuvastatin, metoprolol succinate, and amLODipine.   Meds ordered this encounter  Medications   gabapentin (NEURONTIN) 100 MG capsule    Sig: Take 1 capsule (100 mg total) by mouth at bedtime.    Dispense:  90 capsule    Refill:  3    Order Specific Question:   Supervising Provider    Answer:   Sherlene Shams [[id number]]    Return precautions given.   Risks, benefits, and alternatives of the medications and treatment plan prescribed today were discussed, and patient expressed understanding.   Education regarding symptom management and diagnosis given to  patient on AVS.  Continue to follow with Joshua Grana, FNP for routine health maintenance.   Joshua Sandoval and I agreed with plan.   Rennie Plowman, FNP

## 2022-04-04 NOTE — Patient Instructions (Addendum)
Please check and ensure you are taking Crestor 5mg  ; this is a cholesterol medication.   Start gabapentin 100mg  which is for numbness and pain.   Let me know how you are doing  Shoulder Impingement Syndrome Rehab Ask your health care provider which exercises are safe for you. Do exercises exactly as told by your health care provider and adjust them as directed. It is normal to feel mild stretching, pulling, tightness, or discomfort as you do these exercises. Stop right away if you feel sudden pain or your pain gets worse. Do not begin these exercises until told by your health care provider. Stretching and range-of-motion exercise This exercise warms up your muscles and joints and improves the movement and flexibility of your shoulder. This exercise also helps to relieve pain and stiffness. Passive horizontal adduction In passive adduction, you use your other hand to move the injured arm toward your body. The injured arm does not move on its own. In this movement, your arm is moved across your body in the horizontal plane (horizontal adduction). Sit or stand and pull your left / right elbow across your chest, toward your other shoulder. Stop when you feel a gentle stretch in the back of your shoulder and upper arm. Keep your arm at shoulder height. Keep your arm as close to your body as you comfortably can. Hold for __________ seconds. Slowly return to the starting position. Repeat __________ times. Complete this exercise __________ times a day. Strengthening exercises These exercises build strength and endurance in your shoulder. Endurance is the ability to use your muscles for a long time, even after they get tired. External rotation, isometric This is an exercise in which you press the back of your wrist against a door frame without moving your shoulder joint (isometric). Stand or sit in a doorway, facing the door frame. Bend your left / right elbow and place the back of your wrist against  the door frame. Only the back of your wrist should be touching the frame. Keep your upper arm at your side. Gently press your wrist against the door frame, as if you are trying to push your arm away from your abdomen (external rotation). Press as hard as you are able without pain. Avoid shrugging your shoulder while you press your wrist against the door frame. Keep your shoulder blade tucked down toward the middle of your back. Hold for __________ seconds. Slowly release the tension, and relax your muscles completely before you repeat the exercise. Repeat __________ times. Complete this exercise __________ times a day. Internal rotation, isometric This is an exercise in which you press your palm against a door frame without moving your shoulder joint (isometric). Stand or sit in a doorway, facing the door frame. Bend your left / right elbow and place the palm of your hand against the door frame. Only your palm should be touching the frame. Keep your upper arm at your side. Gently press your hand against the door frame, as if you are trying to push your arm toward your abdomen (internal rotation). Press as hard as you are able without pain. Avoid shrugging your shoulder while you press your hand against the door frame. Keep your shoulder blade tucked down toward the middle of your back. Hold for __________ seconds. Slowly release the tension, and relax your muscles completely before you repeat the exercise. Repeat __________ times. Complete this exercise __________ times a day. Scapular protraction, supine  Lie on your back on a firm surface (supine position).  Hold a __________ weight in your left / right hand. Raise your left / right arm straight into the air so your hand is directly above your shoulder joint. Push the weight into the air so your shoulder (scapula) lifts off the surface that you are lying on. The scapula will push up or forward (protraction). Do not move your head, neck, or  back. Hold for __________ seconds. Slowly return to the starting position. Let your muscles relax completely before you repeat this exercise. Repeat __________ times. Complete this exercise __________ times a day. Scapular retraction  Sit in a stable chair without armrests, or stand up. Secure an exercise band to a stable object in front of you so the band is at shoulder height. Hold one end of the exercise band in each hand. Your palms should face down. Squeeze your shoulder blades together (retraction) and move your elbows slightly behind you. Do not shrug your shoulders upward while you do this. Hold for __________ seconds. Slowly return to the starting position. Repeat __________ times. Complete this exercise __________ times a day. Shoulder extension  Sit in a stable chair without armrests, or stand up. Secure an exercise band to a stable object in front of you so the band is above shoulder height. Hold one end of the exercise band in each hand. Straighten your elbows and lift your hands up to shoulder height. Squeeze your shoulder blades together and pull your hands down to the sides of your thighs (extension). Stop when your hands are straight down by your sides. Do not let your hands go behind your body. Hold for __________ seconds. Slowly return to the starting position. Repeat __________ times. Complete this exercise __________ times a day. This information is not intended to replace advice given to you by your health care provider. Make sure you discuss any questions you have with your health care provider. Document Revised: 08/24/2018 Document Reviewed: 05/28/2018 Elsevier Patient Education  2023 [employer].

## 2022-04-04 NOTE — Assessment & Plan Note (Signed)
Chronic, stable.  Continue losartan 25 mg daily, metoprolol succinate 50 mg daily, amlodipine 10 mg QD

## 2022-04-04 NOTE — Assessment & Plan Note (Signed)
Idiopathic.  ? CTS . Patient A1c well controlled.  B12 is optimal.  No neck pain at this time so deferred imaging of cervical spine.  We will trial gabapentin with close follow-up.  Counseled on side effects of sedation

## 2022-04-04 NOTE — Assessment & Plan Note (Signed)
Presentation consistent with shoulder impingement.  Pending x-ray of and provided patient with exercises to start at home.  Trial gabapentin.  If no improvement, consider physical therapy, orthopedic consult

## 2022-04-04 NOTE — Progress Notes (Signed)
Discussed during OV. Please see OV notes

## 2022-05-11 ENCOUNTER — Other Ambulatory Visit: Payer: Self-pay | Admitting: Family

## 2022-05-11 DIAGNOSIS — M25512 Pain in left shoulder: Secondary | ICD-10-CM

## 2022-06-03 ENCOUNTER — Other Ambulatory Visit: Payer: Self-pay | Admitting: Internal Medicine

## 2022-06-08 ENCOUNTER — Other Ambulatory Visit: Payer: Self-pay | Admitting: Internal Medicine

## 2022-07-06 ENCOUNTER — Other Ambulatory Visit: Payer: Self-pay | Admitting: Internal Medicine

## 2022-07-08 ENCOUNTER — Other Ambulatory Visit: Payer: Self-pay | Admitting: Internal Medicine

## 2022-09-08 ENCOUNTER — Telehealth: Payer: Self-pay | Admitting: Family

## 2022-09-08 NOTE — Telephone Encounter (Signed)
Pt spouse called in staying that she would like to speak to Arnett when she gets a chance. She booked an appt on 4/30 to see provider.

## 2022-09-12 NOTE — Telephone Encounter (Signed)
LVM to call back to remind him of appt on 09/13/22

## 2022-09-13 ENCOUNTER — Ambulatory Visit: Payer: Medicare PPO | Admitting: Family

## 2022-09-13 NOTE — Telephone Encounter (Signed)
Pt cancelled appt for 09/13/22

## 2022-09-14 ENCOUNTER — Ambulatory Visit: Payer: Medicare PPO | Attending: Internal Medicine | Admitting: Internal Medicine

## 2022-09-14 NOTE — Progress Notes (Deleted)
   Follow-up Outpatient Visit Date: 09/14/2022  Primary Care Provider: Allegra Grana, FNP [address] [place] [address]  Chief Complaint: ***  HPI:  Joshua Sandoval is a 89 y.o. male with history of hypertension, frequent PVCs, and right bundle branch block, who presents for follow-up of frequent PVCs and hypertension.  I last saw him in 12/2021, at which time he was feeling well other than some numbness in his fingers.  He declined repeat ischemia evaluation.  No medication changes were made.  I suggested he see his PCP for further evaluation of his hand numbness and consider referral to a hand specialist if symptoms persisted.  --------------------------------------------------------------------------------------------------  Past Medical History:  Diagnosis Date   Diastolic dysfunction    a. 12/2017 Echo: EF 50-55%, no rwma, Gr1 DD, nl RV fxn; b. 03/2020 Echo: EF 55-60%, no rwma, Gr1 DD, Mildly red RV fxn, triv MR, mild to mod AoV sclerosis w/o stenosis.   History of stress test    a. 01/2018 MV: EF 55%. No ischemia/infarct-->Low risk.   Hypertension    PVC's (premature ventricular contractions)    a. 02/2018 Holter: RSR, >7k PVCs (15% burden). 641 runs of NSVT up to 6 beats.   RBBB    Past Surgical History:  Procedure Laterality Date   NO PAST SURGERIES      No outpatient medications have been marked as taking for the 09/14/22 encounter (Appointment) with Shriyans Kuenzi, Cristal Deer, MD.    Allergies: Other  Social History   Tobacco Use   Smoking status: Former    Types: Cigarettes    Quit date: 1980    Years since quitting: 44.3   Smokeless tobacco: Never  Vaping Use   Vaping Use: Never used  Substance Use Topics   Alcohol use: Not Currently   Drug use: No    Family History  Problem Relation Age of Onset   Hypertension Mother    Stroke Mother     Review of Systems: A 12-system review of systems was performed and was negative except as noted in the  HPI.  --------------------------------------------------------------------------------------------------  Physical Exam: There were no vitals taken for this visit.  General:  NAD. Neck: No JVD or HJR. Lungs: Clear to auscultation bilaterally without wheezes or crackles. Heart: Regular rate and rhythm without murmurs, rubs, or gallops. Abdomen: Soft, nontender, nondistended. Extremities: No lower extremity edema.  EKG:  ***  Lab Results  Component Value Date   WBC 3.9 07/02/2021   HGB 14.8 07/02/2021   HCT 45.0 07/02/2021   MCV 84.4 07/02/2021   PLT 149 07/02/2021    Lab Results  Component Value Date   NA 138 07/16/2021   K 4.5 07/16/2021   CL 101 07/16/2021   CO2 29 07/16/2021   BUN 15 07/16/2021   CREATININE 1.02 07/16/2021   GLUCOSE 99 07/16/2021   ALT 12 07/02/2021    Lab Results  Component Value Date   CHOL 251 (H) 07/16/2021   HDL 59.90 07/16/2021   LDLCALC 179 (H) 07/16/2021   LDLDIRECT 150 (H) 01/29/2018   TRIG 60.0 07/16/2021   CHOLHDL 4 07/16/2021    --------------------------------------------------------------------------------------------------  ASSESSMENT AND PLAN: Yvonne Kendall, MD 09/14/2022 7:43 AM

## 2022-09-15 ENCOUNTER — Encounter: Payer: Self-pay | Admitting: Internal Medicine

## 2022-09-23 ENCOUNTER — Ambulatory Visit: Payer: Medicare PPO | Admitting: Family

## 2022-09-29 ENCOUNTER — Other Ambulatory Visit: Payer: Self-pay | Admitting: Internal Medicine

## 2022-09-29 NOTE — Telephone Encounter (Signed)
last visit 12/16/2021--6 month(s) next visit 11/18/22

## 2022-10-06 ENCOUNTER — Other Ambulatory Visit: Payer: Self-pay | Admitting: Internal Medicine

## 2022-10-14 ENCOUNTER — Encounter: Payer: Self-pay | Admitting: Family

## 2022-10-14 ENCOUNTER — Ambulatory Visit: Payer: Medicare PPO | Admitting: Family

## 2022-10-14 VITALS — BP 136/66 | HR 65 | Temp 97.6°F | Ht 70.0 in | Wt 164.0 lb

## 2022-10-14 DIAGNOSIS — E7800 Pure hypercholesterolemia, unspecified: Secondary | ICD-10-CM | POA: Diagnosis not present

## 2022-10-14 DIAGNOSIS — R5383 Other fatigue: Secondary | ICD-10-CM | POA: Diagnosis not present

## 2022-10-14 DIAGNOSIS — R7301 Impaired fasting glucose: Secondary | ICD-10-CM

## 2022-10-14 DIAGNOSIS — G4700 Insomnia, unspecified: Secondary | ICD-10-CM

## 2022-10-14 DIAGNOSIS — R413 Other amnesia: Secondary | ICD-10-CM | POA: Diagnosis not present

## 2022-10-14 DIAGNOSIS — I10 Essential (primary) hypertension: Secondary | ICD-10-CM | POA: Diagnosis not present

## 2022-10-14 DIAGNOSIS — I671 Cerebral aneurysm, nonruptured: Secondary | ICD-10-CM

## 2022-10-14 LAB — COMPREHENSIVE METABOLIC PANEL
ALT: 10 U/L (ref 0–53)
AST: 19 U/L (ref 0–37)
Albumin: 4.4 g/dL (ref 3.5–5.2)
Alkaline Phosphatase: 48 U/L (ref 39–117)
BUN: 14 mg/dL (ref 6–23)
CO2: 27 mEq/L (ref 19–32)
Calcium: 9.9 mg/dL (ref 8.4–10.5)
Chloride: 102 mEq/L (ref 96–112)
Creatinine, Ser: 1.09 mg/dL (ref 0.40–1.50)
GFR: 60.82 mL/min (ref >60.00)
Glucose, Bld: 112 mg/dL — ABNORMAL HIGH (ref 70–99)
Potassium: 4.3 mEq/L (ref 3.5–5.1)
Sodium: 138 mEq/L (ref 135–145)
Total Bilirubin: 0.9 mg/dL (ref 0.2–1.2)
Total Protein: 7.6 g/dL (ref 6.0–8.3)

## 2022-10-14 LAB — LIPID PANEL
Cholesterol: 255 mg/dL — ABNORMAL HIGH (ref 0–200)
HDL: 63.5 mg/dL (ref >39.00)
LDL Cholesterol: 175 mg/dL — ABNORMAL HIGH (ref 0–99)
NonHDL: 191.79
Total CHOL/HDL Ratio: 4
Triglycerides: 83 mg/dL (ref 0.0–149.0)
VLDL: 16.6 mg/dL (ref 0.0–40.0)

## 2022-10-14 LAB — CBC WITH DIFFERENTIAL/PLATELET
Basophils Absolute: 0 10*3/uL (ref 0.0–0.1)
Basophils Relative: 0.6 % (ref 0.0–3.0)
Eosinophils Absolute: 0.2 10*3/uL (ref 0.0–0.7)
Eosinophils Relative: 2.5 % (ref 0.0–5.0)
HCT: 43.7 % (ref 39.0–52.0)
Hemoglobin: 14.3 g/dL (ref 13.0–17.0)
Lymphocytes Relative: 38.8 % (ref 12.0–46.0)
Lymphs Abs: 2.7 10*3/uL (ref 0.7–4.0)
MCHC: 32.8 g/dL (ref 30.0–36.0)
MCV: 85.9 fl (ref 78.0–100.0)
Monocytes Absolute: 0.6 10*3/uL (ref 0.1–1.0)
Monocytes Relative: 9.1 % (ref 3.0–12.0)
Neutro Abs: 3.4 10*3/uL (ref 1.4–7.7)
Neutrophils Relative %: 49 % (ref 43.0–77.0)
Platelets: 167 10*3/uL (ref 150.0–400.0)
RBC: 5.09 Mil/uL (ref 4.22–5.81)
RDW: 14.1 % (ref 11.5–15.5)
WBC: 6.9 10*3/uL (ref 4.0–10.5)

## 2022-10-14 LAB — MICROALBUMIN / CREATININE URINE RATIO
Creatinine,U: 57 mg/dL
Microalb Creat Ratio: 19.5 mg/g (ref 0.0–30.0)
Microalb, Ur: 11.1 mg/dL — ABNORMAL HIGH (ref 0.0–1.9)

## 2022-10-14 LAB — B12 AND FOLATE PANEL
Folate: 9.8 ng/mL (ref >5.9)
Vitamin B-12: 163 pg/mL — ABNORMAL LOW (ref 211–911)

## 2022-10-14 LAB — LDL CHOLESTEROL, DIRECT: Direct LDL: 178 mg/dL

## 2022-10-14 LAB — TSH: TSH: 2.16 u[IU]/mL (ref 0.35–5.50)

## 2022-10-14 LAB — HEMOGLOBIN A1C: Hgb A1c MFr Bld: 6.5 % (ref 4.6–6.5)

## 2022-10-14 NOTE — Assessment & Plan Note (Addendum)
MMSE 22/30.  MRI brain 07/08/2021 with no evidence of acute intracranial abnormality.  Chronic small vessel ischemic changes which are mild to moderate.  Small chronic infarct.  Intact variation or presence of aneurysm.  Moderate generalized cerebral atrophy.  Repeat MR angio 11/30/21  as ordered by Dr. Myer Haff with unchanged aneurysm.His MR angiogram is stable.  There is no need for further follow-up.  Discussed my concern in particular with patient driving.  Patient is adamant that he is still able to drive.  We discussed safety and safety of those around him.  I had a private conversation with his wife while patient was in the lab and advised her to strongly enforce that family members drive him to work and errands until he is seen by neurology.  She very much verbalized understanding.  Pending labs today, referral to neurology

## 2022-10-14 NOTE — Assessment & Plan Note (Signed)
Chronic, stable.  Continue losartan 25 mg daily, metoprolol succinate 50 mg daily, amlodipine 10 mg QD 

## 2022-10-14 NOTE — Patient Instructions (Addendum)
Essentials for good sleep:   #1 Exercise #2 Limit Caffeine ( no caffeine after lunch) #3 No smart phones, TV prior to bed -- BLUE light is VERY activating and send the brain an 'awake message.'  #4 Go to bed at same time of night each night and get up at same time of day.  #5 Take 0.5 to 5mg  melatonin at 7pm with dinner -this is when natural melatonin will start to increase   I have placed a referral to neurology for discussion as it relates to memory changes.  While we are in the work up phase, I strongly advise not to drive until we know more information for your safety and those around you.

## 2022-10-14 NOTE — Progress Notes (Signed)
Assessment & Plan:  Memory changes Assessment & Plan: MMSE 22/30.  MRI brain 07/08/2021 with no evidence of acute intracranial abnormality.  Chronic small vessel ischemic changes which are mild to moderate.  Small chronic infarct.  Intact variation or presence of aneurysm.  Moderate generalized cerebral atrophy.  Repeat MR angio 11/30/21  as ordered by Dr. Myer Haff with unchanged aneurysm.His MR angiogram is stable.  There is no need for further follow-up.  Discussed my concern in particular with patient driving.  Patient is adamant that he is still able to drive.  We discussed safety and safety of those around him.  I had a private conversation with his wife while patient was in the lab and advised her to strongly enforce that family members drive him to work and errands until he is seen by neurology.  She very much verbalized understanding.  Pending labs today, referral to neurology  Orders: -     TSH -     B12 and Folate Panel -     RPR -     Ambulatory referral to Neurology  Pure hypercholesterolemia Assessment & Plan: Anticipate stable.  Pending lipid panel.  Continue Crestor 5 mg QD  Orders: -     Lipid panel -     LDL cholesterol, direct  Essential hypertension Assessment & Plan: Chronic, stable.  Continue losartan 25 mg daily, metoprolol succinate 50 mg daily, amlodipine 10 mg QD  Orders: -     Comprehensive metabolic panel -     Microalbumin / creatinine urine ratio  Impaired fasting glucose -     Comprehensive metabolic panel -     Hemoglobin A1c -     CBC with Differential/Platelet  Other fatigue -     TSH  Insomnia, unspecified type  Anterior cerebral artery aneurysm     Return precautions given.   Risks, benefits, and alternatives of the medications and treatment plan prescribed today were discussed, and patient expressed understanding.   Education regarding symptom management and diagnosis given to patient on AVS either electronically or  printed.  Return in about 2 months (around 12/14/2022).  Rennie Plowman, FNP  Subjective:    Patient ID: Joshua Sandoval, male    DOB: 11-Jul-1932, 89 y.o.   MRN: [id number]  CC: Joshua Sandoval is a 89 y.o. male who presents today for follow up.   HPI: Accompanied by wife  He complains of trouble staying asleep.  He goes to bed at 9am and wakes up 2am.   He stays active and works at [employer] 1-2 per week.  2 AM. 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   Wife is concerned in regards to memory over the past several months.   Patient cooks simple meals such as a hotdog. He forgot name of grandson. He is driving.      MRI brain 07/08/2021 with no evidence of acute intracranial abnormality.  Chronic small vessel ischemic changes which are mild to moderate.  Small chronic infarct.  Intact variation or presence of aneurysm.  Moderate generalized cerebral atrophy.  Repeat MR angio7/18/23  as ordered by Dr. Myer Haff with unchanged aneurysm.His MR angiogram is stable.  There is no need for further follow-up.  Allergies: Other Current Outpatient Medications on File Prior to Visit  Medication Sig Dispense Refill   amLODipine (NORVASC) 10 MG tablet TAKE 1 TABLET(10 MG) BY MOUTH DAILY 90 tablet 0   aspirin EC 81 MG tablet Take 81 mg by mouth as needed.  gabapentin (NEURONTIN) 100 MG capsule Take 1 capsule (100 mg total) by mouth at bedtime. 90 capsule 3   losartan (COZAAR) 25 MG tablet Take 1 tablet (25 mg total) by mouth daily. 90 tablet 0   metoprolol succinate (TOPROL-XL) 50 MG 24 hr tablet TAKE 1 TABLET(50 MG) BY MOUTH DAILY WITH OR IMMEDIATELY FOLLOWING A MEAL 90 tablet 0   rosuvastatin (CRESTOR) 5 MG tablet Take 1 tablet (5 mg total) by mouth daily. (Patient not taking: Reported on 10/14/2022) 90 tablet 3   No current facility-administered medications on file prior to visit.    Review of Systems  Constitutional:  Positive for fatigue. Negative for chills and fever.   Respiratory:  Negative for cough.   Cardiovascular:  Negative for chest pain and palpitations.  Gastrointestinal:  Negative for nausea and vomiting.  Psychiatric/Behavioral:  Positive for sleep disturbance.       Objective:    BP 136/66   Pulse 65   Temp 97.6 F (36.4 C) (Oral)   Ht 5\' 10"  (1.778 m)   Wt 164 lb (74.4 kg)   SpO2 97%   BMI 23.53 kg/m  BP Readings from Last 3 Encounters:  10/14/22 136/66  04/04/22 132/84  03/03/22 138/86   Wt Readings from Last 3 Encounters:  10/14/22 164 lb (74.4 kg)  04/04/22 163 lb 12.8 oz (74.3 kg)  03/03/22 162 lb 12.8 oz (73.8 kg)    Physical Exam Vitals reviewed.  Constitutional:      Appearance: He is well-developed.  Cardiovascular:     Rate and Rhythm: Regular rhythm.     Heart sounds: Normal heart sounds.  Pulmonary:     Effort: Pulmonary effort is normal. No respiratory distress.     Breath sounds: Normal breath sounds. No wheezing, rhonchi or rales.  Skin:    General: Skin is warm and dry.  Neurological:     Mental Status: He is alert.  Psychiatric:        Speech: Speech normal.        Behavior: Behavior normal.

## 2022-10-14 NOTE — Assessment & Plan Note (Signed)
Anticipate stable.  Pending lipid panel.  Continue Crestor 5 mg QD

## 2022-10-15 LAB — RPR: RPR Ser Ql: NONREACTIVE

## 2022-10-20 ENCOUNTER — Telehealth: Payer: Self-pay

## 2022-10-20 NOTE — Telephone Encounter (Signed)
LVM to call back to go over results and schedule appt to recheck labs

## 2022-10-28 ENCOUNTER — Telehealth: Payer: Self-pay

## 2022-10-28 NOTE — Telephone Encounter (Signed)
LVM to call back to schedule appt to discuss abnormal labs

## 2022-11-01 NOTE — Telephone Encounter (Signed)
Pt returned [facility] CMA call. Unable to transfer. Pt stated he's available [phone].

## 2022-11-01 NOTE — Telephone Encounter (Signed)
LVM to call back.

## 2022-11-04 NOTE — Telephone Encounter (Signed)
LVM  to call back to go over abnormal labs

## 2022-11-09 NOTE — Telephone Encounter (Signed)
Spoke to pt and informed him that per Claris Che she recommended melatonin OTC   Take 0.5 to 5mg  melatonin at 7pm with dinner -this is when natural melatonin will start to increase.  Pt verbalized understanding

## 2022-11-09 NOTE — Telephone Encounter (Signed)
Patient states he is returning our call.  I spoke with Jenate Swaziland, CMA, and she states Rennie Plowman, FNP, would like to schedule an appointment with patient to discuss his lab results.  I scheduled an appointment for patient on 11/21/2022, when he will return from being out of town.  Patient states the last time he saw Rennie Plowman, FNP, she was going to give him something to help him sleep and his pharmacy has not received it yet, and this was about two weeks ago.   Patient states his preferred pharmacy is [facility] on [address] in [place].

## 2022-11-09 NOTE — Telephone Encounter (Signed)
Call pt  I recommended melatonin OTC  Take 0.5 to 5mg  melatonin at 7pm with dinner -this is when natural melatonin will start to increase

## 2022-11-18 ENCOUNTER — Ambulatory Visit: Payer: Medicare PPO | Attending: Internal Medicine | Admitting: Internal Medicine

## 2022-11-18 NOTE — Progress Notes (Deleted)
  Cardiology Office Note:  .   Date:  11/18/2022  ID:  Joshua Sandoval, DOB [birth year], MRN [id number] PCP: Allegra Grana, FNP  [facility] Providers Cardiologist:  Yvonne Kendall, MD     History of Present Illness: .   Joshua Sandoval is a 89 y.o. male with history of hypertension, frequent PVCs, and right bundle branch block, who presents for follow-up of PVCs and hypertension.  I last saw him in 12/2021, at which time his only complaint was of intermittent numbness in his fingers.  He was without cardiac symptoms.  Initial blood pressure was mildly elevated but improved on repeat check.  Given his frequent PVCs, we again discussed ischemia testing, though Mr. Queener wished to defer this given his lack of symptoms.  ROS: See HPI  Studies Reviewed: Marland Kitchen        TTE (03/17/2020): Normal LV size with mild asymmetric basal septal hypertrophy.  LVEF 55-60% with grade 1 diastolic dysfunction and elevated filling pressures.  Normal RV size and function.  Normal biatrial size.  Mild mitral annular calcification with trivial regurgitation.  Trivial tricuspid regurgitation.  Aortic sclerosis without stenosis or regurgitation.  Normal CVP.  24-hour Holter monitor (02/21/2018): Frequent PVCs (15% burden) and 641 episodes of ventricular runs lasting up to 6 beats.  Risk Assessment/Calculations:   {Does this patient have ATRIAL FIBRILLATION?[phone]} No BP recorded.  {Refresh Note OR Click here to enter BP  :1}***       Physical Exam:   VS:  There were no vitals taken for this visit.   Wt Readings from Last 3 Encounters:  10/14/22 164 lb (74.4 kg)  04/04/22 163 lb 12.8 oz (74.3 kg)  03/03/22 162 lb 12.8 oz (73.8 kg)    General:  NAD. Neck: No JVD or HJR. Lungs: Clear to auscultation bilaterally without wheezes or crackles. Heart: Regular rate and rhythm without murmurs, rubs, or gallops. Abdomen: Soft, nontender, nondistended. Extremities: No lower extremity edema.  ASSESSMENT AND PLAN: .     ***    {Are you ordering a CV Procedure (e.g. stress test, cath, DCCV, TEE, etc)?   Press F2        :[id number]}  Dispo: ***  Signed, Yvonne Kendall, MD

## 2022-11-21 ENCOUNTER — Ambulatory Visit: Payer: Medicare PPO | Admitting: Family

## 2022-11-21 ENCOUNTER — Encounter: Payer: Self-pay | Admitting: Internal Medicine

## 2022-11-21 NOTE — Progress Notes (Unsigned)
Cardiology Office Note:    Date:  11/22/2022   ID:  Joshua Sandoval, DOB 1932/09/29, MRN [id number]  PCP:  Allegra Grana, FNP  [facility] Cardiologist:  Yvonne Kendall, MD  [facility] Electrophysiologist:  None   Referring MD: Allegra Grana, FNP   Chief Complaint: 6 month follow-up  History of Present Illness:    Joshua Sandoval is a 89 y.o. male with a hx of HTN, frequent PVCs, RBBB, and diastolic dysfunction who presents for follow-up for PVCs.   He has a history of DOE and previously underwent echo in 2021 that showed LVEF 55-60%, trivial MR, G1DD, mild to mod aortic sclerosis without stenosis. Stress testing was advised however, he initially declined. In August 2022 he was agreeable to proceed with stress testing in the setting of of frequent PVCs. Unfortunately he never followed through with stress testing. He was also scheduled for Cardiac CTA and Myoview lexiscan, but never followed up.   The patient was last seen 12/2021 and was overall feeling well.   Today, the patient is overall feeling well. He denies chest pain, SOB, lower leg edema, orthopnea, or pnd. No dizziness or lightheadedness. BP a little high, he says he has been a lot of hotdogs/salt. He goes to the [employer] and lifts weights.   Past Medical History:  Diagnosis Date   Diastolic dysfunction    a. 12/2017 Echo: EF 50-55%, no rwma, Gr1 DD, nl RV fxn; b. 03/2020 Echo: EF 55-60%, no rwma, Gr1 DD, Mildly red RV fxn, triv MR, mild to mod AoV sclerosis w/o stenosis.   History of stress test    a. 01/2018 MV: EF 55%. No ischemia/infarct-->Low risk.   Hypertension    PVC's (premature ventricular contractions)    a. 02/2018 Holter: RSR, >7k PVCs (15% burden). 641 runs of NSVT up to 6 beats.   RBBB     Past Surgical History:  Procedure Laterality Date   NO PAST SURGERIES      Current Medications: Current Meds  Medication Sig   amLODipine (NORVASC) 10 MG tablet TAKE 1 TABLET(10 MG) BY MOUTH DAILY   aspirin EC 81 MG  tablet Take 81 mg by mouth as needed.    gabapentin (NEURONTIN) 100 MG capsule Take 1 capsule (100 mg total) by mouth at bedtime.   losartan (COZAAR) 25 MG tablet Take 1 tablet (25 mg total) by mouth daily.   metoprolol succinate (TOPROL-XL) 50 MG 24 hr tablet TAKE 1 TABLET(50 MG) BY MOUTH DAILY WITH OR IMMEDIATELY FOLLOWING A MEAL   rosuvastatin (CRESTOR) 5 MG tablet Take 1 tablet (5 mg total) by mouth daily.     Allergies:   Other   Social History   Socioeconomic History   Marital status: Married    Spouse name: Not on file   Number of children: Not on file   Years of education: Not on file   Highest education level: Not on file  Occupational History   Not on file  Tobacco Use   Smoking status: Former    Types: Cigarettes    Quit date: 1980    Years since quitting: 44.5   Smokeless tobacco: Never  Vaping Use   Vaping Use: Never used  Substance and Sexual Activity   Alcohol use: Not Currently   Drug use: No   Sexual activity: Yes    Partners: Female  Other Topics Concern   Not on file  Social History Narrative   Not on file   Social Determinants of Health  Financial Resource Strain: Low Risk  (03/03/2022)   Overall Financial Resource Strain (CARDIA)    Difficulty of Paying Living Expenses: Not hard at all  Food Insecurity: No Food Insecurity (03/03/2022)   Hunger Vital Sign    Worried About Running Out of Food in the Last Year: Never true    Ran Out of Food in the Last Year: Never true  Transportation Needs: No Transportation Needs (03/03/2022)   PRAPARE - Administrator, Civil Service (Medical): No    Lack of Transportation (Non-Medical): No  Physical Activity: Insufficiently Active (03/03/2022)   Exercise Vital Sign    Days of Exercise per Week: 2 days    Minutes of Exercise per Session: 50 min  Stress: No Stress Concern Present (03/03/2022)   [employer] of Occupational Health - Occupational Stress Questionnaire    Feeling of Stress : Not  at all  Social Connections: Unknown (03/03/2022)   Social Connection and Isolation Panel [NHANES]    Frequency of Communication with Friends and Family: More than three times a week    Frequency of Social Gatherings with Friends and Family: More than three times a week    Attends Religious Services: Not on Marketing executive or Organizations: Not on file    Attends Banker Meetings: Not on file    Marital Status: Not on file     Family History: The patient's family history includes Hypertension in his mother; Stroke in his mother.  ROS:   Please see the history of present illness.     All other systems reviewed and are negative.  EKGs/Labs/Other Studies Reviewed:    The following studies were reviewed today:  Echo 2021 1. Left ventricular ejection fraction, by estimation, is 55 to 60%. The  left ventricle has normal function. The left ventricle has no regional  wall motion abnormalities. There is mild asymmetric left ventricular  hypertrophy of the basal-septal segment.  Left ventricular diastolic parameters are consistent with Grade I  diastolic dysfunction (impaired relaxation). Elevated left atrial  pressure.   2. Right ventricular systolic function is mildly reduced. The right  ventricular size is normal. Tricuspid regurgitation signal is inadequate  for assessing PA pressure.   3. The mitral valve is normal in structure. Trivial mitral valve  regurgitation. No evidence of mitral stenosis.   4. The aortic valve is tricuspid. There is mild thickening of the aortic  valve. Aortic valve regurgitation is not visualized. Mild to moderate  aortic valve sclerosis/calcification is present, without any evidence of  aortic stenosis.   5. The inferior vena cava is normal in size with greater than 50%  respiratory variability, suggesting right atrial pressure of 3 mmHg.    EKG:  EKG is ordered today.  The ekg ordered today demonstrates NSR 72bpm, PVCs,  RBBB, LAFB  Recent Labs: 10/14/2022: ALT 10; BUN 14; Creatinine, Ser 1.09; Hemoglobin 14.3; Platelets 167.0; Potassium 4.3; Sodium 138; TSH 2.16  Recent Lipid Panel    Component Value Date/Time   CHOL 255 (H) 10/14/2022 0952   CHOL 224 (H) 01/29/2018 1503   TRIG 83.0 10/14/2022 0952   HDL 63.50 10/14/2022 0952   HDL 59 01/29/2018 1503   CHOLHDL 4 10/14/2022 0952   VLDL 16.6 10/14/2022 0952   LDLCALC 175 (H) 10/14/2022 0952   LDLCALC 146 (H) 01/29/2018 1503   LDLDIRECT 178.0 10/14/2022 0952     Physical Exam:    VS:  BP (!) 150/50 (BP Location:  Left Arm, Patient Position: Sitting, Cuff Size: Normal)   Pulse 72   Ht 5\' 5"  (1.651 m)   Wt 160 lb 9.6 oz (72.8 kg)   SpO2 97%   BMI 26.73 kg/m     Wt Readings from Last 3 Encounters:  11/22/22 160 lb 9.6 oz (72.8 kg)  10/14/22 164 lb (74.4 kg)  04/04/22 163 lb 12.8 oz (74.3 kg)     GEN:  Well nourished, well developed in no acute distress HEENT: Normal NECK: No JVD; No carotid bruits LYMPHATICS: No lymphadenopathy CARDIAC: RRR, no murmurs, rubs, gallops RESPIRATORY:  Clear to auscultation without rales, wheezing or rhonchi  ABDOMEN: Soft, non-tender, non-distended MUSCULOSKELETAL:  No edema; No deformity  SKIN: Warm and dry NEUROLOGIC:  Alert and oriented x 3 PSYCHIATRIC:  Normal affect   ASSESSMENT:    1. PVC's (premature ventricular contractions)   2. Essential hypertension   3. Hyperlipidemia, mixed    PLAN:    In order of problems listed above:  PVCs Patient denies palpitations. EKG shows NSR with PVCs. Continue Toprol 50mg  daily.   HTN BP mildly elevated. He admits to eating a lot of salt (hot dogs), he will work on this. Refill amlodipine, Toprol and Losartan.   HLD LDL 178. He knows he needs to make diet changes. Continue Crestor 5mg  daily.    Disposition: Follow up in 1 year(s) with MD/APP     Signed, Domingos Riggi David Stall, PA-C  11/22/2022 8:40 AM    [facility]

## 2022-11-22 ENCOUNTER — Encounter: Payer: Self-pay | Admitting: Medical

## 2022-11-22 ENCOUNTER — Ambulatory Visit: Payer: Medicare PPO | Attending: Internal Medicine | Admitting: Medical

## 2022-11-22 VITALS — BP 150/50 | HR 72 | Ht 65.0 in | Wt 160.6 lb

## 2022-11-22 DIAGNOSIS — I493 Ventricular premature depolarization: Secondary | ICD-10-CM

## 2022-11-22 DIAGNOSIS — E782 Mixed hyperlipidemia: Secondary | ICD-10-CM | POA: Diagnosis not present

## 2022-11-22 DIAGNOSIS — I10 Essential (primary) hypertension: Secondary | ICD-10-CM | POA: Diagnosis not present

## 2022-11-22 MED ORDER — LOSARTAN POTASSIUM 25 MG PO TABS: 25.0000 mg | ORAL_TABLET | Freq: Every day | ORAL | 3 refills | Status: DC

## 2022-11-22 MED ORDER — AMLODIPINE BESYLATE 10 MG PO TABS: ORAL_TABLET | ORAL | 3 refills | Status: DC

## 2022-11-22 MED ORDER — METOPROLOL SUCCINATE ER 50 MG PO TB24: ORAL_TABLET | ORAL | 3 refills | Status: DC

## 2022-11-22 NOTE — Patient Instructions (Signed)
Medication Instructions:   Your physician recommends that you continue on your current medications as directed. Please refer to the Current Medication list given to you today.  *If you need a refill on your cardiac medications before your next appointment, please call your pharmacy*   Lab Work:  None ordered today.  If you have labs (blood work) drawn today and your tests are completely normal, you will receive your results only by: MyChart Message (if you have MyChart) OR A paper copy in the mail If you have any lab test that is abnormal or we need to change your treatment, we will call you to review the results.   Testing/Procedures:  None ordered today.   Follow-Up: At [facility], you and your health needs are our priority.  As part of our continuing mission to provide you with exceptional heart care, we have created designated Provider Care Teams.  These Care Teams include your primary Cardiologist (physician) and Advanced Practice Providers (APPs -  Physician Assistants and Nurse Practitioners) who all work together to provide you with the care you need, when you need it.  We recommend signing up for the patient portal called "MyChart".  Sign up information is provided on this After Visit Summary.  MyChart is used to connect with patients for Virtual Visits (Telemedicine).  Patients are able to view lab/test results, encounter notes, upcoming appointments, etc.  Non-urgent messages can be sent to your provider as well.   To learn more about what you can do with MyChart, go to [email or web address].    Your next appointment:   12 month(s)  Provider:   You may see Yvonne Kendall, MD or one of the following Advanced Practice Providers on your designated Care Team:   Nicolasa Ducking, NP Eula Listen, PA-C [name], PA-C Charlsie Quest, NP

## 2022-11-23 DIAGNOSIS — H2513 Age-related nuclear cataract, bilateral: Secondary | ICD-10-CM | POA: Diagnosis not present

## 2022-11-23 DIAGNOSIS — H35371 Puckering of macula, right eye: Secondary | ICD-10-CM | POA: Diagnosis not present

## 2022-11-23 DIAGNOSIS — Z0100 Encounter for examination of eyes and vision without abnormal findings: Secondary | ICD-10-CM | POA: Diagnosis not present

## 2022-12-07 DIAGNOSIS — H2512 Age-related nuclear cataract, left eye: Secondary | ICD-10-CM | POA: Diagnosis not present

## 2022-12-07 DIAGNOSIS — H2511 Age-related nuclear cataract, right eye: Secondary | ICD-10-CM | POA: Diagnosis not present

## 2022-12-16 NOTE — Anesthesia Preprocedure Evaluation (Addendum)
Anesthesia Evaluation  Patient identified by MRN, date of birth, ID band Patient awake    Reviewed: Allergy & Precautions, H&P , NPO status , Patient's Chart, lab work & pertinent test results  Airway Mallampati: III  TM Distance: >3 FB Neck ROM: Full    Dental no notable dental hx. (+) Lower Dentures, Upper Dentures   Pulmonary former smoker   Pulmonary exam normal breath sounds clear to auscultation       Cardiovascular hypertension, Normal cardiovascular exam+ dysrhythmias  Rhythm:Irregular Rate:Normal   Diastolic dysfunction grade I      a. 12/2017 Echo: EF 50-55%, no rwma, Gr1 DD, nl RV fxn;              b. 03/2020 Echo: EF 55-60%, no rwma, Gr1 DD, Mildly red RV fxn, triv MR, mild to mod AoV sclerosis w/o stenosis.   History of stress test     a. 01/2018 MV: EF 55%. No ischemia/infarct-->Low risk.  Hypertension    PVC's (premature ventricular contractions)     a. 02/2018 Holter: RSR, >7k PVCs (15% burden). 641 runs of NSVT up to 6 beats.  RBBB      Neuro/Psych negative neurological ROS  negative psych ROS   GI/Hepatic negative GI ROS, Neg liver ROS,,,  Endo/Other  negative endocrine ROS    Renal/GU negative Renal ROS  negative genitourinary   Musculoskeletal negative musculoskeletal ROS (+)    Abdominal   Peds  (+) Delivery details - Hematology negative hematology ROS (+)   Anesthesia Other Findings Hypertension  Diastolic dysfunction PVC's (premature ventricular contractions) RBBB History of stress test   1. Left ventricular ejection fraction, by estimation, is 55 to 60%. The  left ventricle has normal function. The left ventricle has no regional  wall motion abnormalities. There is mild asymmetric left ventricular  hypertrophy of the basal-septal segment.  Left ventricular diastolic parameters are consistent with Grade I  diastolic dysfunction (impaired relaxation). Elevated left atrial   pressure.   2. Right ventricular systolic function is mildly reduced. The right  ventricular size is normal. Tricuspid regurgitation signal is inadequate  for assessing PA pressure.   3. The mitral valve is normal in structure. Trivial mitral valve  regurgitation. No evidence of mitral stenosis.   4. The aortic valve is tricuspid. There is mild thickening of the aortic  valve. Aortic valve regurgitation is not visualized. Mild to moderate  aortic valve sclerosis/calcification is present, without any evidence of  aortic stenosis.   5. The inferior vena cava is normal in size with greater than 50%  respiratory variability, suggesting right atrial pressure of 3 mmHg.    Reproductive/Obstetrics negative OB ROS                             Anesthesia Physical Anesthesia Plan  ASA: 3  Anesthesia Plan: MAC   Post-op Pain Management:    Induction: Intravenous  PONV Risk Score and Plan:   Airway Management Planned: Natural Airway and Nasal Cannula  Additional Equipment:   Intra-op Plan:   Post-operative Plan:   Informed Consent: I have reviewed the patients History and Physical, chart, labs and discussed the procedure including the risks, benefits and alternatives for the proposed anesthesia with the patient or authorized representative who has indicated his/her understanding and acceptance.     Dental Advisory Given  Plan Discussed with: Anesthesiologist, CRNA and Surgeon  Anesthesia Plan Comments: (Patient consented for risks of anesthesia including  but not limited to:  - adverse reactions to medications - damage to eyes, teeth, lips or other oral mucosa - nerve damage due to positioning  - sore throat or hoarseness - Damage to heart, brain, nerves, lungs, other parts of body or loss of life  Patient voiced understanding.)        Anesthesia Quick Evaluation

## 2022-12-20 ENCOUNTER — Encounter: Payer: Self-pay | Admitting: Ophthalmology

## 2022-12-22 NOTE — Discharge Instructions (Signed)

## 2022-12-27 ENCOUNTER — Encounter
Admission: RE | Disposition: A | Discharge status: Home / Self Care | Payer: Self-pay | Source: Home / Self Care | Attending: Ophthalmology

## 2022-12-27 ENCOUNTER — Ambulatory Visit: Payer: Medicare PPO | Admitting: Anesthesiology

## 2022-12-27 ENCOUNTER — Other Ambulatory Visit: Payer: Self-pay

## 2022-12-27 ENCOUNTER — Ambulatory Visit
Admission: RE | Admit: 2022-12-27 | Discharge: 2022-12-27 | Disposition: A | Discharge status: Home / Self Care | Payer: Medicare PPO | Attending: Ophthalmology | Admitting: Ophthalmology

## 2022-12-27 ENCOUNTER — Encounter: Payer: Self-pay | Admitting: Ophthalmology

## 2022-12-27 DIAGNOSIS — I4510 Unspecified right bundle-branch block: Secondary | ICD-10-CM | POA: Insufficient documentation

## 2022-12-27 DIAGNOSIS — H269 Unspecified cataract: Secondary | ICD-10-CM | POA: Diagnosis not present

## 2022-12-27 DIAGNOSIS — Z87891 Personal history of nicotine dependence: Secondary | ICD-10-CM | POA: Diagnosis not present

## 2022-12-27 DIAGNOSIS — I10 Essential (primary) hypertension: Secondary | ICD-10-CM | POA: Diagnosis not present

## 2022-12-27 DIAGNOSIS — H2511 Age-related nuclear cataract, right eye: Secondary | ICD-10-CM | POA: Diagnosis not present

## 2022-12-27 HISTORY — PX: CATARACT EXTRACTION W/PHACO: SHX586

## 2022-12-27 SURGERY — PHACOEMULSIFICATION, CATARACT, WITH IOL INSERTION
Anesthesia: Monitor Anesthesia Care | Site: Eye | Laterality: Right

## 2022-12-27 MED ORDER — SIGHTPATH DOSE#1 BSS IO SOLN
INTRAOCULAR | Status: DC | PRN
  Administered 2022-12-27: 15 mL via INTRAOCULAR

## 2022-12-27 MED ORDER — FENTANYL CITRATE (PF) 100 MCG/2ML IJ SOLN
INTRAMUSCULAR | Status: DC | PRN
  Administered 2022-12-27: 50 ug via INTRAVENOUS

## 2022-12-27 MED ORDER — BRIMONIDINE TARTRATE-TIMOLOL 0.2-0.5 % OP SOLN
OPHTHALMIC | Status: DC | PRN
  Administered 2022-12-27: 1 [drp] via OPHTHALMIC

## 2022-12-27 MED ORDER — TETRACAINE HCL 0.5 % OP SOLN
1.0000 [drp] | OPHTHALMIC | Status: DC | PRN
  Administered 2022-12-27 (×3): 1 [drp] via OPHTHALMIC

## 2022-12-27 MED ORDER — ARMC OPHTHALMIC DILATING DROPS
1.0000 | OPHTHALMIC | Status: DC | PRN
  Administered 2022-12-27 (×3): 1 via OPHTHALMIC

## 2022-12-27 MED ORDER — SIGHTPATH DOSE#1 BSS IO SOLN
INTRAOCULAR | Status: DC | PRN
  Administered 2022-12-27: 95 mL via OPHTHALMIC

## 2022-12-27 MED ORDER — SODIUM CHLORIDE 0.9% FLUSH
INTRAVENOUS | Status: DC | PRN
  Administered 2022-12-27: 10 mL via INTRAVENOUS

## 2022-12-27 MED ORDER — SIGHTPATH DOSE#1 NA CHONDROIT SULF-NA HYALURON 40-17 MG/ML IO SOLN
INTRAOCULAR | Status: DC | PRN
  Administered 2022-12-27: 1 mL via INTRAOCULAR

## 2022-12-27 MED ORDER — MIDAZOLAM HCL 2 MG/2ML IJ SOLN
INTRAMUSCULAR | Status: DC | PRN
  Administered 2022-12-27: .5 mg via INTRAVENOUS

## 2022-12-27 MED ORDER — LACTATED RINGERS IV SOLN: INTRAVENOUS | Status: DC

## 2022-12-27 MED ORDER — SIGHTPATH DOSE#1 BSS IO SOLN
INTRAOCULAR | Status: DC | PRN
  Administered 2022-12-27: 2 mL

## 2022-12-27 MED ORDER — MOXIFLOXACIN HCL 0.5 % OP SOLN
OPHTHALMIC | Status: DC | PRN
  Administered 2022-12-27: .2 mL via OPHTHALMIC

## 2022-12-27 SURGICAL SUPPLY — 12 items
ANGLE REVERSE CUT SHRT 25GA (CUTTER) ×1
CANNULA ANT/CHMB 27G (MISCELLANEOUS) IMPLANT
CANNULA ANT/CHMB 27GA (MISCELLANEOUS)
CATARACT SUITE SIGHTPATH (MISCELLANEOUS) ×1
CYSTOTOME ANGL RVRS SHRT 25G (CUTTER) ×1 IMPLANT
FEE CATARACT SUITE SIGHTPATH (MISCELLANEOUS) ×1 IMPLANT
GLOVE BIOGEL PI IND STRL 8 (GLOVE) ×1 IMPLANT
GLOVE SURG ENC TEXT LTX SZ8 (GLOVE) ×1 IMPLANT
LENS IOL TECNIS EYHANCE 21.0 (Intraocular Lens) IMPLANT
NDL FILTER BLUNT 18X1 1/2 (NEEDLE) ×1 IMPLANT
NEEDLE FILTER BLUNT 18X1 1/2 (NEEDLE) ×1
SYR 3ML LL SCALE MARK (SYRINGE) ×1 IMPLANT

## 2022-12-27 NOTE — H&P (Signed)
[facility]   Primary Care Physician:  Allegra Grana, FNP Ophthalmologist: Dr. Druscilla Brownie  Pre-Procedure History & Physical: HPI:  Joshua Sandoval is a 89 y.o. male here for cataract surgery.   Past Medical History:  Diagnosis Date   Diastolic dysfunction    a. 12/2017 Echo: EF 50-55%, no rwma, Gr1 DD, nl RV fxn; b. 03/2020 Echo: EF 55-60%, no rwma, Gr1 DD, Mildly red RV fxn, triv MR, mild to mod AoV sclerosis w/o stenosis.   History of stress test    a. 01/2018 MV: EF 55%. No ischemia/infarct-->Low risk.   Hypertension    PVC's (premature ventricular contractions)    a. 02/2018 Holter: RSR, >7k PVCs (15% burden). 641 runs of NSVT up to 6 beats.   RBBB     Past Surgical History:  Procedure Laterality Date   NO PAST SURGERIES      Prior to Admission medications   Medication Sig Start Date End Date Taking? Authorizing Provider  amLODipine (NORVASC) 10 MG tablet TAKE 1 TABLET(10 MG) BY MOUTH DAILY 11/22/22  Yes Furth, Cadence H, PA-C  aspirin EC 81 MG tablet Take 81 mg by mouth as needed.    Yes [provider]  gabapentin (NEURONTIN) 100 MG capsule Take 1 capsule (100 mg total) by mouth at bedtime. 04/04/22  Yes Allegra Grana, FNP  losartan (COZAAR) 25 MG tablet Take 1 tablet (25 mg total) by mouth daily. 11/22/22  Yes Furth, Cadence H, PA-C  metoprolol succinate (TOPROL-XL) 50 MG 24 hr tablet TAKE 1 TABLET(50 MG) BY MOUTH DAILY WITH OR IMMEDIATELY FOLLOWING A MEAL 11/22/22  Yes Furth, Cadence H, PA-C  rosuvastatin (CRESTOR) 5 MG tablet Take 1 tablet (5 mg total) by mouth daily. 07/30/21  Yes Allegra Grana, FNP    Allergies as of 12/05/2022 - Review Complete 11/22/2022  Allergen Reaction Noted   Other  12/18/2017    Family History  Problem Relation Age of Onset   Hypertension Mother    Stroke Mother     Social History   Socioeconomic History   Marital status: Married    Spouse name: Not on file   Number of children: Not on file   Years of education:  Not on file   Highest education level: Not on file  Occupational History   Not on file  Tobacco Use   Smoking status: Former    Current packs/day: 0.00    Types: Cigarettes    Quit date: 1980    Years since quitting: 44.6   Smokeless tobacco: Never  Vaping Use   Vaping status: Never Used  Substance and Sexual Activity   Alcohol use: Not Currently   Drug use: No   Sexual activity: Yes    Partners: Female  Other Topics Concern   Not on file  Social History Narrative   Not on file   Social Determinants of Health   Financial Resource Strain: Low Risk  (03/03/2022)   Overall Financial Resource Strain (CARDIA)    Difficulty of Paying Living Expenses: Not hard at all  Food Insecurity: No Food Insecurity (03/03/2022)   Hunger Vital Sign    Worried About Running Out of Food in the Last Year: Never true    Ran Out of Food in the Last Year: Never true  Transportation Needs: No Transportation Needs (03/03/2022)   PRAPARE - Administrator, Civil Service (Medical): No    Lack of Transportation (Non-Medical): No  Physical Activity: Insufficiently Active (03/03/2022)  Exercise Vital Sign    Days of Exercise per Week: 2 days    Minutes of Exercise per Session: 50 min  Stress: No Stress Concern Present (03/03/2022)   [employer] of Occupational Health - Occupational Stress Questionnaire    Feeling of Stress : Not at all  Social Connections: Unknown (03/03/2022)   Social Connection and Isolation Panel [NHANES]    Frequency of Communication with Friends and Family: More than three times a week    Frequency of Social Gatherings with Friends and Family: More than three times a week    Attends Religious Services: Not on file    Active Member of Clubs or Organizations: Not on file    Attends Banker Meetings: Not on file    Marital Status: Not on file  Intimate Partner Violence: Not At Risk (03/03/2022)   Humiliation, Afraid, Rape, and Kick questionnaire     Fear of Current or Ex-Partner: No    Emotionally Abused: No    Physically Abused: No    Sexually Abused: No    Review of Systems: See HPI, otherwise negative ROS  Physical Exam: BP 128/85   Temp (!) 97.3 F (36.3 C) (Temporal)   Resp 12   Ht 5' 5.98" (1.676 m)   Wt 71 kg   SpO2 95%   BMI 25.27 kg/m  General:   Alert, cooperative in NAD Head:  Normocephalic and atraumatic. Respiratory:  Normal work of breathing. Cardiovascular:  RRR  Impression/Plan: Joshua Sandoval is here for cataract surgery.  Risks, benefits, limitations, and alternatives regarding cataract surgery have been reviewed with the patient.  Questions have been answered.  All parties agreeable.   Galen Manila, MD  12/27/2022, 11:44 AM

## 2022-12-27 NOTE — Transfer of Care (Signed)
Immediate Anesthesia Transfer of Care Note  Patient: Joshua Sandoval  Procedure(s) Performed: CATARACT EXTRACTION PHACO AND INTRAOCULAR LENS PLACEMENT (IOC) RIGHT 8.98 01:03.7 (Right: Eye)  Patient Location: PACU  Anesthesia Type: MAC  Level of Consciousness: awake, alert  and patient cooperative  Airway and Oxygen Therapy: Patient Spontanous Breathing and Patient connected to supplemental oxygen  Post-op Assessment: Post-op Vital signs reviewed, Patient's Cardiovascular Status Stable, Respiratory Function Stable, Patent Airway and No signs of Nausea or vomiting  Post-op Vital Signs: Reviewed and stable  Complications: No notable events documented.

## 2022-12-27 NOTE — Anesthesia Postprocedure Evaluation (Signed)
Anesthesia Post Note  Patient: Haze Ferrufino  Procedure(s) Performed: CATARACT EXTRACTION PHACO AND INTRAOCULAR LENS PLACEMENT (IOC) RIGHT 8.98 01:03.7 (Right: Eye)  Patient location during evaluation: PACU Anesthesia Type: MAC Level of consciousness: awake and alert Pain management: pain level controlled Vital Signs Assessment: post-procedure vital signs reviewed and stable Respiratory status: spontaneous breathing, nonlabored ventilation, respiratory function stable and patient connected to nasal cannula oxygen Cardiovascular status: stable and blood pressure returned to baseline Postop Assessment: no apparent nausea or vomiting Anesthetic complications: no   No notable events documented.   Last Vitals:  Vitals:   12/27/22 1217 12/27/22 1221  BP: 114/62 125/69  Pulse: 84 (!) 47  Resp: 11 12  Temp: (!) 36.3 C (!) 36.3 C  SpO2: 96% 96%    Last Pain:  Vitals:   12/27/22 1221  TempSrc:   PainSc: 0-No pain                 Zamoria Boss C Marieelena Bartko

## 2022-12-27 NOTE — Op Note (Signed)
PREOPERATIVE DIAGNOSIS:  Nuclear sclerotic cataract of the right eye.   POSTOPERATIVE DIAGNOSIS:  Cataract   OPERATIVE PROCEDURE:ORPROCALL@   SURGEON:  Galen Manila, MD.   ANESTHESIA:  Anesthesiologist: Marisue Humble, MD CRNA: Lynden Oxford, CRNA  1.      Managed anesthesia care. 2.      0.41ml of Shugarcaine was instilled in the eye following the paracentesis.   COMPLICATIONS:  None.   TECHNIQUE:   Stop and chop   DESCRIPTION OF PROCEDURE:  The patient was examined and consented in the preoperative holding area where the aforementioned topical anesthesia was applied to the right eye and then brought back to the Operating Room where the right eye was prepped and draped in the usual sterile ophthalmic fashion and a lid speculum was placed. A paracentesis was created with the side port blade and the anterior chamber was filled with viscoelastic. A near clear corneal incision was performed with the steel keratome. A continuous curvilinear capsulorrhexis was performed with a cystotome followed by the capsulorrhexis forceps. Hydrodissection and hydrodelineation were carried out with BSS on a blunt cannula. The lens was removed in a stop and chop  technique and the remaining cortical material was removed with the irrigation-aspiration handpiece. The capsular bag was inflated with viscoelastic and the Technis ZCB00  lens was placed in the capsular bag without complication. The remaining viscoelastic was removed from the eye with the irrigation-aspiration handpiece. The wounds were hydrated. The anterior chamber was flushed with BSS and the eye was inflated to physiologic pressure. 0.36ml of Vigamox was placed in the anterior chamber. The wounds were found to be water tight. The eye was dressed with Combigan. The patient was given protective glasses to wear throughout the day and a shield with which to sleep tonight. The patient was also given drops with which to begin a drop regimen today and  will follow-up with me in one day. Implant Name Type Inv. Item Serial No. Manufacturer Lot No. LRB No. Used Action  LENS IOL TECNIS EYHANCE 21.0 - [id number] Intraocular Lens LENS IOL TECNIS EYHANCE 21.0 [id number] [employer]  Right 1 Implanted   Procedure(s): CATARACT EXTRACTION PHACO AND INTRAOCULAR LENS PLACEMENT (IOC) RIGHT 8.98 01:03.7 (Right)  Electronically signed: Galen Manila 12/27/2022 12:15 PM

## 2022-12-28 ENCOUNTER — Encounter: Payer: Self-pay | Admitting: Ophthalmology

## 2022-12-28 DIAGNOSIS — H2512 Age-related nuclear cataract, left eye: Secondary | ICD-10-CM | POA: Diagnosis not present

## 2023-01-24 ENCOUNTER — Encounter: Payer: Self-pay | Admitting: Ophthalmology

## 2023-01-24 ENCOUNTER — Encounter
Admission: RE | Disposition: A | Discharge status: Home / Self Care | Payer: Self-pay | Source: Home / Self Care | Attending: Ophthalmology

## 2023-01-24 ENCOUNTER — Ambulatory Visit: Payer: Medicare PPO | Admitting: Anesthesiology

## 2023-01-24 ENCOUNTER — Other Ambulatory Visit: Payer: Self-pay

## 2023-01-24 ENCOUNTER — Ambulatory Visit
Admission: RE | Admit: 2023-01-24 | Discharge: 2023-01-24 | Disposition: A | Discharge status: Home / Self Care | Payer: Medicare PPO | Attending: Ophthalmology | Admitting: Ophthalmology

## 2023-01-24 DIAGNOSIS — H2512 Age-related nuclear cataract, left eye: Secondary | ICD-10-CM | POA: Insufficient documentation

## 2023-01-24 DIAGNOSIS — I10 Essential (primary) hypertension: Secondary | ICD-10-CM | POA: Diagnosis not present

## 2023-01-24 DIAGNOSIS — H269 Unspecified cataract: Secondary | ICD-10-CM | POA: Diagnosis not present

## 2023-01-24 DIAGNOSIS — I4510 Unspecified right bundle-branch block: Secondary | ICD-10-CM | POA: Insufficient documentation

## 2023-01-24 DIAGNOSIS — Z87891 Personal history of nicotine dependence: Secondary | ICD-10-CM | POA: Insufficient documentation

## 2023-01-24 HISTORY — PX: CATARACT EXTRACTION W/PHACO: SHX586

## 2023-01-24 SURGERY — PHACOEMULSIFICATION, CATARACT, WITH IOL INSERTION
Anesthesia: Monitor Anesthesia Care | Laterality: Left

## 2023-01-24 MED ORDER — LACTATED RINGERS IV SOLN: INTRAVENOUS | Status: DC

## 2023-01-24 MED ORDER — SIGHTPATH DOSE#1 BSS IO SOLN
INTRAOCULAR | Status: DC | PRN
  Administered 2023-01-24: 2 mL

## 2023-01-24 MED ORDER — ARMC OPHTHALMIC DILATING DROPS
1.0000 | OPHTHALMIC | Status: DC | PRN
  Administered 2023-01-24 (×3): 1 via OPHTHALMIC

## 2023-01-24 MED ORDER — MOXIFLOXACIN HCL 0.5 % OP SOLN
OPHTHALMIC | Status: DC | PRN
  Administered 2023-01-24: .2 mL via OPHTHALMIC

## 2023-01-24 MED ORDER — SIGHTPATH DOSE#1 BSS IO SOLN
INTRAOCULAR | Status: DC | PRN
  Administered 2023-01-24: 15 mL via INTRAOCULAR

## 2023-01-24 MED ORDER — SIGHTPATH DOSE#1 NA CHONDROIT SULF-NA HYALURON 40-17 MG/ML IO SOLN
INTRAOCULAR | Status: DC | PRN
  Administered 2023-01-24: 1 mL via INTRAOCULAR

## 2023-01-24 MED ORDER — FENTANYL CITRATE (PF) 100 MCG/2ML IJ SOLN
INTRAMUSCULAR | Status: DC | PRN
  Administered 2023-01-24: 100 ug via INTRAVENOUS

## 2023-01-24 MED ORDER — SIGHTPATH DOSE#1 BSS IO SOLN
INTRAOCULAR | Status: DC | PRN
  Administered 2023-01-24: 53 mL via OPHTHALMIC

## 2023-01-24 MED ORDER — MIDAZOLAM HCL 2 MG/2ML IJ SOLN
INTRAMUSCULAR | Status: DC | PRN
  Administered 2023-01-24: 2 mg via INTRAVENOUS

## 2023-01-24 MED ORDER — TETRACAINE HCL 0.5 % OP SOLN
1.0000 [drp] | OPHTHALMIC | Status: DC | PRN
  Administered 2023-01-24 (×3): 1 [drp] via OPHTHALMIC

## 2023-01-24 MED ORDER — BRIMONIDINE TARTRATE-TIMOLOL 0.2-0.5 % OP SOLN
OPHTHALMIC | Status: DC | PRN
  Administered 2023-01-24: 1 [drp] via OPHTHALMIC

## 2023-01-24 SURGICAL SUPPLY — 11 items
ANGLE REVERSE CUT SHRT 25GA (CUTTER) ×1
CATARACT SUITE SIGHTPATH (MISCELLANEOUS) ×1
CYSTOTOME ANGL RVRS SHRT 25G (CUTTER) ×1 IMPLANT
FEE CATARACT SUITE SIGHTPATH (MISCELLANEOUS) ×1 IMPLANT
GLOVE BIOGEL PI IND STRL 8 (GLOVE) ×1 IMPLANT
GLOVE SURG LX STRL 8.0 MICRO (GLOVE) ×1 IMPLANT
LENS IOL TECNIS EYHANCE 21.5 (Intraocular Lens) IMPLANT
NDL FILTER BLUNT 18X1 1/2 (NEEDLE) ×1 IMPLANT
NEEDLE FILTER BLUNT 18X1 1/2 (NEEDLE) ×1
RING MALYGIN (MISCELLANEOUS) IMPLANT
SYR 3ML LL SCALE MARK (SYRINGE) ×1 IMPLANT

## 2023-01-24 NOTE — Op Note (Signed)
PREOPERATIVE DIAGNOSIS:  Nuclear sclerotic cataract of the left eye.   POSTOPERATIVE DIAGNOSIS:  Nuclear sclerotic cataract of the left eye.   OPERATIVE PROCEDURE:ORPROCALL@   SURGEON:  Galen Manila, MD.   ANESTHESIA:  Anesthesiologist: Marisue Humble, MD CRNA: Domenic Moras, CRNA  1.      Managed anesthesia care. 2.     0.22ml of Shugarcaine was instilled following the paracentesis   COMPLICATIONS: Viscoelastic was used to raise the pupil margin.  A  Malyugin ring was placed as the pupil would not achieve sufficient pharmacologic dilation to undergo cataract extraction safely.( The ring was removed atraumatically following insertion of the IOL.)    TECHNIQUE:   Stop and chop   DESCRIPTION OF PROCEDURE:  The patient was examined and consented in the preoperative holding area where the aforementioned topical anesthesia was applied to the left eye and then brought back to the Operating Room where the left eye was prepped and draped in the usual sterile ophthalmic fashion and a lid speculum was placed. A paracentesis was created with the side port blade and the anterior chamber was filled with viscoelastic. A near clear corneal incision was performed with the steel keratome. A continuous curvilinear capsulorrhexis was performed with a cystotome followed by the capsulorrhexis forceps. Hydrodissection and hydrodelineation were carried out with BSS on a blunt cannula. The lens was removed in a stop and chop  technique and the remaining cortical material was removed with the irrigation-aspiration handpiece. The capsular bag was inflated with viscoelastic and the Technis ZCB00 lens was placed in the capsular bag without complication. The remaining viscoelastic was removed from the eye with the irrigation-aspiration handpiece. The wounds were hydrated. The anterior chamber was flushed with BSS and the eye was inflated to physiologic pressure. 0.74ml Vigamox was placed in the anterior chamber. The  wounds were found to be water tight. The eye was dressed with Combigan. The patient was given protective glasses to wear throughout the day and a shield with which to sleep tonight. The patient was also given drops with which to begin a drop regimen today and will follow-up with me in one day. Implant Name Type Inv. Item Serial No. Manufacturer Lot No. LRB No. Used Action  LENS IOL TECNIS EYHANCE 21.5 - [id number] Intraocular Lens LENS IOL TECNIS EYHANCE 21.5 [id number] [employer]  Left 1 Implanted    Procedure(s): CATARACT EXTRACTION PHACO AND INTRAOCULAR LENS PLACEMENT (IOC) LEFT 10.65 00:54.0 (Left)  Electronically signed: Galen Manila 01/24/2023 11:36 AM

## 2023-01-24 NOTE — Transfer of Care (Signed)
Immediate Anesthesia Transfer of Care Note  Patient: Joshua Sandoval  Procedure(s) Performed: CATARACT EXTRACTION PHACO AND INTRAOCULAR LENS PLACEMENT (IOC) LEFT 10.65 00:54.0 (Left)  Patient Location: PACU  Anesthesia Type: MAC  Level of Consciousness: awake, alert  and patient cooperative  Airway and Oxygen Therapy: Patient Spontanous Breathing and Patient connected to supplemental oxygen  Post-op Assessment: Post-op Vital signs reviewed, Patient's Cardiovascular Status Stable, Respiratory Function Stable, Patent Airway and No signs of Nausea or vomiting  Post-op Vital Signs: Reviewed and stable  Complications: No notable events documented.

## 2023-01-24 NOTE — H&P (Signed)
[facility]   Primary Care Physician:  Allegra Grana, FNP Ophthalmologist: Dr. Druscilla Brownie  Pre-Procedure History & Physical: HPI:  Joshua Sandoval is a 89 y.o. male here for cataract surgery.   Past Medical History:  Diagnosis Date   Diastolic dysfunction    a. 12/2017 Echo: EF 50-55%, no rwma, Gr1 DD, nl RV fxn; b. 03/2020 Echo: EF 55-60%, no rwma, Gr1 DD, Mildly red RV fxn, triv MR, mild to mod AoV sclerosis w/o stenosis.   History of stress test    a. 01/2018 MV: EF 55%. No ischemia/infarct-->Low risk.   Hypertension    PVC's (premature ventricular contractions)    a. 02/2018 Holter: RSR, >7k PVCs (15% burden). 641 runs of NSVT up to 6 beats.   RBBB     Past Surgical History:  Procedure Laterality Date   CATARACT EXTRACTION W/PHACO Right 12/27/2022   Procedure: CATARACT EXTRACTION PHACO AND INTRAOCULAR LENS PLACEMENT (IOC) RIGHT 8.98 01:03.7;  Surgeon: Galen Manila, MD;  Location: [facility];  Service: Ophthalmology;  Laterality: Right;   NO PAST SURGERIES      Prior to Admission medications   Medication Sig Start Date End Date Taking? Authorizing Provider  amLODipine (NORVASC) 10 MG tablet TAKE 1 TABLET(10 MG) BY MOUTH DAILY 11/22/22  Yes Furth, Cadence H, PA-C  aspirin EC 81 MG tablet Take 81 mg by mouth as needed.    Yes [provider]  gabapentin (NEURONTIN) 100 MG capsule Take 1 capsule (100 mg total) by mouth at bedtime. 04/04/22  Yes Allegra Grana, FNP  losartan (COZAAR) 25 MG tablet Take 1 tablet (25 mg total) by mouth daily. 11/22/22  Yes Furth, Cadence H, PA-C  metoprolol succinate (TOPROL-XL) 50 MG 24 hr tablet TAKE 1 TABLET(50 MG) BY MOUTH DAILY WITH OR IMMEDIATELY FOLLOWING A MEAL 11/22/22  Yes Furth, Cadence H, PA-C  rosuvastatin (CRESTOR) 5 MG tablet Take 1 tablet (5 mg total) by mouth daily. 07/30/21  Yes Allegra Grana, FNP    Allergies as of 12/05/2022 - Review Complete 11/22/2022  Allergen Reaction Noted   Other  12/18/2017     Family History  Problem Relation Age of Onset   Hypertension Mother    Stroke Mother     Social History   Socioeconomic History   Marital status: Married    Spouse name: Not on file   Number of children: Not on file   Years of education: Not on file   Highest education level: Not on file  Occupational History   Not on file  Tobacco Use   Smoking status: Former    Current packs/day: 0.00    Types: Cigarettes    Quit date: 1980    Years since quitting: 44.7   Smokeless tobacco: Never  Vaping Use   Vaping status: Never Used  Substance and Sexual Activity   Alcohol use: Not Currently   Drug use: No   Sexual activity: Yes    Partners: Female  Other Topics Concern   Not on file  Social History Narrative   Not on file   Social Determinants of Health   Financial Resource Strain: Low Risk  (03/03/2022)   Overall Financial Resource Strain (CARDIA)    Difficulty of Paying Living Expenses: Not hard at all  Food Insecurity: No Food Insecurity (03/03/2022)   Hunger Vital Sign    Worried About Running Out of Food in the Last Year: Never true    Ran Out of Food in the Last Year: Never  true  Transportation Needs: No Transportation Needs (03/03/2022)   PRAPARE - Administrator, Civil Service (Medical): No    Lack of Transportation (Non-Medical): No  Physical Activity: Insufficiently Active (03/03/2022)   Exercise Vital Sign    Days of Exercise per Week: 2 days    Minutes of Exercise per Session: 50 min  Stress: No Stress Concern Present (03/03/2022)   [employer] of Occupational Health - Occupational Stress Questionnaire    Feeling of Stress : Not at all  Social Connections: Unknown (03/03/2022)   Social Connection and Isolation Panel [NHANES]    Frequency of Communication with Friends and Family: More than three times a week    Frequency of Social Gatherings with Friends and Family: More than three times a week    Attends Religious Services: Not on  file    Active Member of Clubs or Organizations: Not on file    Attends Banker Meetings: Not on file    Marital Status: Not on file  Intimate Partner Violence: Not At Risk (03/03/2022)   Humiliation, Afraid, Rape, and Kick questionnaire    Fear of Current or Ex-Partner: No    Emotionally Abused: No    Physically Abused: No    Sexually Abused: No    Review of Systems: See HPI, otherwise negative ROS  Physical Exam: BP (!) 157/74   Temp 97.7 F (36.5 C) (Temporal)   Resp 14   Ht 5\' 9"  (1.753 m)   Wt 72 kg   SpO2 98%   BMI 23.44 kg/m  General:   Alert, cooperative in NAD Head:  Normocephalic and atraumatic. Respiratory:  Normal work of breathing. Cardiovascular:  RRR  Impression/Plan: Joshua Sandoval is here for cataract surgery.  Risks, benefits, limitations, and alternatives regarding cataract surgery have been reviewed with the patient.  Questions have been answered.  All parties agreeable.   Galen Manila, MD  01/24/2023, 11:07 AM

## 2023-01-24 NOTE — Discharge Instructions (Signed)

## 2023-01-24 NOTE — Anesthesia Preprocedure Evaluation (Signed)
Anesthesia Evaluation  Patient identified by MRN, date of birth, ID band Patient awake    Reviewed: Allergy & Precautions, H&P , NPO status , Patient's Chart, lab work & pertinent test results  Airway Mallampati: III  TM Distance: >3 FB Neck ROM: Full    Dental no notable dental hx.    Pulmonary neg pulmonary ROS, former smoker   Pulmonary exam normal breath sounds clear to auscultation       Cardiovascular hypertension, Normal cardiovascular exam+ dysrhythmias  Rhythm:Regular Rate:Normal   Diastolic dysfunction grade I                           a. 12/2017 Echo: EF 50-55%, no rwma, Gr1 DD, nl RV fxn;              b. 03/2020 Echo: EF 55-60%, no rwma, Gr1 DD, Mildly red RV fxn, triv MR, mild to mod AoV sclerosis w/o stenosis.              History of stress test               a. 01/2018 MV: EF 55%. No ischemia/infarct-->Low risk.            Hypertension                         PVC's (premature ventricular contractions)                      a. 02/2018 Holter: RSR, >7k PVCs (15% burden). 641 runs of NSVT up to 6 beats.            RBBB                  Neuro/Psych negative neurological ROS  negative psych ROS   GI/Hepatic negative GI ROS, Neg liver ROS,,,  Endo/Other  negative endocrine ROS    Renal/GU negative Renal ROS  negative genitourinary   Musculoskeletal negative musculoskeletal ROS (+)    Abdominal   Peds negative pediatric ROS (+)  Hematology negative hematology ROS (+)   Anesthesia Other Findings Hypertension  Diastolic dysfunction PVC's (premature ventricular contractions) RBBB History of stress test  Previous cataract surgery 12-27-22    Reproductive/Obstetrics negative OB ROS                              Anesthesia Physical Anesthesia Plan  ASA: 3  Anesthesia Plan: MAC   Post-op Pain Management:    Induction: Intravenous  PONV Risk Score and Plan:   Airway  Management Planned: Natural Airway and Nasal Cannula  Additional Equipment:   Intra-op Plan:   Post-operative Plan:   Informed Consent: I have reviewed the patients History and Physical, chart, labs and discussed the procedure including the risks, benefits and alternatives for the proposed anesthesia with the patient or authorized representative who has indicated his/her understanding and acceptance.     Dental Advisory Given  Plan Discussed with: Anesthesiologist, CRNA and Surgeon  Anesthesia Plan Comments: (Patient consented for risks of anesthesia including but not limited to:  - adverse reactions to medications - damage to eyes, teeth, lips or other oral mucosa - nerve damage due to positioning  - sore throat or hoarseness - Damage to heart, brain, nerves, lungs, other parts of body or loss of life  Patient voiced understanding.)  Anesthesia Quick Evaluation

## 2023-01-24 NOTE — Anesthesia Postprocedure Evaluation (Signed)
Anesthesia Post Note  Patient: Joshua Sandoval  Procedure(s) Performed: CATARACT EXTRACTION PHACO AND INTRAOCULAR LENS PLACEMENT (IOC) LEFT 10.65 00:54.0 (Left)  Patient location during evaluation: PACU Anesthesia Type: MAC Level of consciousness: awake and alert Pain management: pain level controlled Vital Signs Assessment: post-procedure vital signs reviewed and stable Respiratory status: spontaneous breathing, nonlabored ventilation, respiratory function stable and patient connected to nasal cannula oxygen Cardiovascular status: stable and blood pressure returned to baseline Postop Assessment: no apparent nausea or vomiting Anesthetic complications: no   No notable events documented.   Last Vitals:  Vitals:   01/24/23 1136 01/24/23 1142  BP: (!) 108/59 112/73  Pulse: (!) 49 (!) 52  Resp: 14 17  Temp: (!) 36.3 C (!) 36.3 C  SpO2: 98% 96%    Last Pain:  Vitals:   01/24/23 1142  TempSrc:   PainSc: 0-No pain                 Buddie Marston C Jasun Gasparini

## 2023-01-25 ENCOUNTER — Encounter: Payer: Self-pay | Admitting: Ophthalmology

## 2023-03-06 ENCOUNTER — Ambulatory Visit: Payer: Medicare PPO | Admitting: *Deleted

## 2023-03-06 VITALS — BP 128/70 | HR 60 | Temp 97.3°F | Ht 70.0 in | Wt 162.4 lb

## 2023-03-06 DIAGNOSIS — Z0000 Encounter for general adult medical examination without abnormal findings: Secondary | ICD-10-CM

## 2023-03-06 NOTE — Patient Instructions (Signed)
Joshua Sandoval , Thank you for taking time to come for your Medicare Wellness Visit. I appreciate your ongoing commitment to your health goals. Please review the following plan we discussed and let me know if I can assist you in the future.   Referrals/Orders/Follow-Ups/Clinician Recommendations: Remember to update your vaccines  This is a list of the screening recommended for you and due dates:  Health Maintenance  Topic Date Due   DTaP/Tdap/Td vaccine (1 - Tdap) Never done   Zoster (Shingles) Vaccine (1 of 2) Never done   Pneumonia Vaccine (2 of 2 - PPSV23 or PCV20) 01/01/2020   Flu Shot  12/15/2022   COVID-19 Vaccine (3 - 2023-24 season) 01/15/2023   Medicare Annual Wellness Visit  03/05/2024   HPV Vaccine  Aged Out    Advanced directives: (Provided) Advance directive discussed with you today. I have provided a copy for you to complete at home and have notarized. Once this is complete, please bring a copy in to our office so we can scan it into your chart.   Next Medicare Annual Wellness Visit scheduled for next year: Yes 03/06/24 @ 11:00

## 2023-03-06 NOTE — Progress Notes (Signed)
Subjective:   Joshua Sandoval is a 89 y.o. male who presents for Medicare Annual/Subsequent preventive examination.  Visit Complete: In person   Cardiac Risk Factors include: advanced age (>71men, >38 women);male gender;hypertension;dyslipidemia;Other (see comment), Risk factor comments: RBBB     Objective:    Today's Vitals   03/06/23 1556  BP: 128/70  Pulse: 60  Temp: (!) 97.3 F (36.3 C)  TempSrc: Skin  SpO2: 97%  Weight: 162 lb 6 oz (73.7 kg)  Height: 5\' 10"  (1.778 m)   Body mass index is 23.3 kg/m.     03/06/2023    4:09 PM 01/24/2023   10:13 AM 12/27/2022   10:57 AM 03/03/2022   10:46 AM 03/01/2021    1:31 PM 02/27/2020    1:15 PM 02/26/2019    1:18 PM  Advanced Directives  Does Patient Have a Medical Advance Directive? No No No No No No No  Would patient like information on creating a medical advance directive? Yes (MAU/Ambulatory/Procedural Areas - Information given) No - Patient declined Yes (MAU/Ambulatory/Procedural Areas - Information given) Yes (MAU/Ambulatory/Procedural Areas - Information given) No - Patient declined No - Patient declined No - Patient declined    Current Medications (verified) Outpatient Encounter Medications as of 03/06/2023  Medication Sig   amLODipine (NORVASC) 10 MG tablet TAKE 1 TABLET(10 MG) BY MOUTH DAILY   aspirin EC 81 MG tablet Take 81 mg by mouth as needed.    gabapentin (NEURONTIN) 100 MG capsule Take 1 capsule (100 mg total) by mouth at bedtime.   losartan (COZAAR) 25 MG tablet Take 1 tablet (25 mg total) by mouth daily.   metoprolol succinate (TOPROL-XL) 50 MG 24 hr tablet TAKE 1 TABLET(50 MG) BY MOUTH DAILY WITH OR IMMEDIATELY FOLLOWING A MEAL   rosuvastatin (CRESTOR) 5 MG tablet Take 1 tablet (5 mg total) by mouth daily.   No facility-administered encounter medications on file as of 03/06/2023.    Allergies (verified) Other   History: Past Medical History:  Diagnosis Date   Diastolic dysfunction    a. 12/2017 Echo:  EF 50-55%, no rwma, Gr1 DD, nl RV fxn; b. 03/2020 Echo: EF 55-60%, no rwma, Gr1 DD, Mildly red RV fxn, triv MR, mild to mod AoV sclerosis w/o stenosis.   History of stress test    a. 01/2018 MV: EF 55%. No ischemia/infarct-->Low risk.   Hypertension    PVC's (premature ventricular contractions)    a. 02/2018 Holter: RSR, >7k PVCs (15% burden). 641 runs of NSVT up to 6 beats.   RBBB    Past Surgical History:  Procedure Laterality Date   CATARACT EXTRACTION W/PHACO Right 12/27/2022   Procedure: CATARACT EXTRACTION PHACO AND INTRAOCULAR LENS PLACEMENT (IOC) RIGHT 8.98 01:03.7;  Surgeon: Galen Manila, MD;  Location: [facility];  Service: Ophthalmology;  Laterality: Right;   CATARACT EXTRACTION W/PHACO Left 01/24/2023   Procedure: CATARACT EXTRACTION PHACO AND INTRAOCULAR LENS PLACEMENT (IOC) LEFT 10.65 00:54.0;  Surgeon: Galen Manila, MD;  Location: [facility];  Service: Ophthalmology;  Laterality: Left;   NO PAST SURGERIES     Family History  Problem Relation Age of Onset   Hypertension Mother    Stroke Mother    Social History   Socioeconomic History   Marital status: Married    Spouse name: Not on file   Number of children: Not on file   Years of education: Not on file   Highest education level: Not on file  Occupational History   Not on file  Tobacco Use   Smoking status: Former    Current packs/day: 0.00    Types: Cigarettes    Quit date: 1980    Years since quitting: 44.8   Smokeless tobacco: Never  Vaping Use   Vaping status: Never Used  Substance and Sexual Activity   Alcohol use: Not Currently   Drug use: No   Sexual activity: Yes    Partners: Female  Other Topics Concern   Not on file  Social History Narrative   married   Social Determinants of Health   Financial Resource Strain: Low Risk  (03/06/2023)   Overall Financial Resource Strain (CARDIA)    Difficulty of Paying Living Expenses: Not hard at all  Food Insecurity: No Food  Insecurity (03/06/2023)   Hunger Vital Sign    Worried About Running Out of Food in the Last Year: Never true    Ran Out of Food in the Last Year: Never true  Transportation Needs: No Transportation Needs (03/06/2023)   PRAPARE - Administrator, Civil Service (Medical): No    Lack of Transportation (Non-Medical): No  Physical Activity: Insufficiently Active (03/06/2023)   Exercise Vital Sign    Days of Exercise per Week: 2 days    Minutes of Exercise per Session: 40 min  Stress: No Stress Concern Present (03/06/2023)   [employer] of Occupational Health - Occupational Stress Questionnaire    Feeling of Stress : Not at all  Social Connections: Moderately Integrated (03/06/2023)   Social Connection and Isolation Panel [NHANES]    Frequency of Communication with Friends and Family: More than three times a week    Frequency of Social Gatherings with Friends and Family: More than three times a week    Attends Religious Services: More than 4 times per year    Active Member of [employer] or Organizations: No    Attends Engineer, structural: Never    Marital Status: Married    Tobacco Counseling Counseling given: Not Answered   Clinical Intake:  Pre-visit preparation completed: Yes  Pain : No/denies pain     BMI - recorded: 23.3 Nutritional Status: BMI of 19-24  Normal Nutritional Risks: None Diabetes: No  How often do you need to have someone help you when you read instructions, pamphlets, or other written materials from your doctor or pharmacy?: 1 - Never  Interpreter Needed?: No  Information entered by :: R. Lyan Holck LPN   Activities of Daily Living    03/06/2023    4:00 PM 01/24/2023   10:06 AM  In your present state of health, do you have any difficulty performing the following activities:  Hearing? 1 0  Vision? 0 0  Comment glasses   Difficulty concentrating or making decisions? 0 0  Walking or climbing stairs? 0 0  Dressing or bathing? 0 0   Doing errands, shopping? 0   Preparing Food and eating ? N   Using the Toilet? N   In the past six months, have you accidently leaked urine? N   Do you have problems with loss of bowel control? N   Managing your Medications? N   Managing your Finances? N   Housekeeping or managing your Housekeeping? N     Patient Care Team: Allegra Grana, FNP as PCP - General (Family Medicine) End, Cristal Deer, MD as PCP - Cardiology (Cardiology)  Indicate any recent Medical Services you may have received from other than [facility] providers in the past year (date may be approximate).  Assessment:   This is a routine wellness examination for [place].  Hearing/Vision screen Hearing Screening - Comments:: Some difficulty Vision Screening - Comments:: glasses   Goals Addressed             This Visit's Progress    Patient Stated       [facility]       Depression Screen    03/06/2023    4:05 PM 10/14/2022    9:09 AM 04/04/2022   10:49 AM 03/03/2022   10:45 AM 07/30/2021    4:00 PM 07/02/2021    2:30 PM 03/01/2021    1:29 PM  PHQ 2/9 Scores  PHQ - 2 Score 0 0 0 0 0 3 0  PHQ- 9 Score 0    0 7     Fall Risk    03/06/2023    4:02 PM 10/14/2022    9:09 AM 04/04/2022   10:49 AM 03/03/2022   10:46 AM 07/30/2021    3:59 PM  Fall Risk   Falls in the past year? 0 0 0 0 0  Number falls in past yr: 0 0 0 0 0  Injury with Fall? 0 0 0 0 0  Risk for fall due to : No Fall Risks No Fall Risks No Fall Risks No Fall Risks No Fall Risks  Follow up Falls prevention discussed;Falls evaluation completed Falls evaluation completed Falls evaluation completed Falls evaluation completed Falls evaluation completed    MEDICARE RISK AT HOME: Medicare Risk at Home Any stairs in or around the home?: Yes If so, are there any without handrails?: No Home free of loose throw rugs in walkways, pet beds, electrical cords, etc?: Yes Adequate lighting in your home to reduce  risk of falls?: Yes Life alert?: No Use of a cane, walker or w/c?: No Grab bars in the bathroom?: No Shower chair or bench in shower?: No Elevated toilet seat or a handicapped toilet?: No  TIMED UP AND GO:  Was the test performed?  Yes  Length of time to ambulate 10 feet: 8 sec Gait steady and fast without use of assistive device    Cognitive Function:        03/06/2023    4:10 PM 03/03/2022   11:15 AM 03/01/2021    1:39 PM 02/26/2019    1:22 PM  6CIT Screen  What Year? 0 points 0 points 0 points 0 points  What month? 0 points 0 points 0 points 0 points  What time? 0 points 0 points 0 points 0 points  Count back from 20 0 points 0 points  0 points  Months in reverse 4 points 0 points  0 points  Repeat phrase 8 points 0 points  2 points  Total Score 12 points 0 points  2 points    Immunizations Immunization History  Administered Date(s) Administered   Fluad Quad(high Dose 65+) 02/27/2019, 02/22/2022   Influenza, High Dose Seasonal PF 02/15/2021   Influenza,inj,Quad PF,6+ Mos 03/07/2018   Moderna Sars-Covid-2 Vaccination 06/24/2019, 07/24/2019   Pneumococcal Conjugate-13 01/01/2019    TDAP status: Due, Education has been provided regarding the importance of this vaccine. Advised may receive this vaccine at local pharmacy or Health Dept. Aware to provide a copy of the vaccination record if obtained from local pharmacy or Health Dept. Verbalized acceptance and understanding.  Flu Vaccine status: Due, Education has been provided regarding the importance of this vaccine. Advised may receive this vaccine at local  pharmacy or Health Dept. Aware to provide a copy of the vaccination record if obtained from local pharmacy or Health Dept. Verbalized acceptance and understanding. Plans on getting it tomorrow at school  Pneumococcal vaccine status: Due, Education has been provided regarding the importance of this vaccine. Advised may receive this vaccine at local pharmacy or Health  Dept. Aware to provide a copy of the vaccination record if obtained from local pharmacy or Health Dept. Verbalized acceptance and understanding.  Covid-19 vaccine status: Declined, Education has been provided regarding the importance of this vaccine but patient still declined. Advised may receive this vaccine at local pharmacy or Health Dept.or vaccine clinic. Aware to provide a copy of the vaccination record if obtained from local pharmacy or Health Dept. Verbalized acceptance and understanding.  Qualifies for Shingles Vaccine? Yes   Zostavax completed No   Shingrix Completed?: No.    Education has been provided regarding the importance of this vaccine. Patient has been advised to call insurance company to determine out of pocket expense if they have not yet received this vaccine. Advised may also receive vaccine at local pharmacy or Health Dept. Verbalized acceptance and understanding.  Screening Tests Health Maintenance  Topic Date Due   DTaP/Tdap/Td (1 - Tdap) Never done   Zoster Vaccines- Shingrix (1 of 2) Never done   Pneumonia Vaccine 12+ Years old (2 of 2 - PPSV23 or PCV20) 01/01/2020   INFLUENZA VACCINE  12/15/2022   COVID-19 Vaccine (3 - 2023-24 season) 01/15/2023   Medicare Annual Wellness (AWV)  03/04/2023   HPV VACCINES  Aged Out    Health Maintenance  Health Maintenance Due  Topic Date Due   DTaP/Tdap/Td (1 - Tdap) Never done   Zoster Vaccines- Shingrix (1 of 2) Never done   Pneumonia Vaccine 69+ Years old (2 of 2 - PPSV23 or PCV20) 01/01/2020   INFLUENZA VACCINE  12/15/2022   COVID-19 Vaccine (3 - 2023-24 season) 01/15/2023   Medicare Annual Wellness (AWV)  03/04/2023    Colorectal cancer screening: No longer required.   Lung Cancer Screening: (Low Dose CT Chest recommended if Age 37-80 years, 20 pack-year currently smoking OR have quit w/in 15years.) does not qualify.   Additional Screening:  Hepatitis C Screening: does not qualify; Completed NA age  Vision  Screening: Recommended annual ophthalmology exams for early detection of glaucoma and other disorders of the eye. Is the patient up to date with their annual eye exam?  Yes  Who is the provider or what is the name of the office in which the patient attends annual eye exams? [facility] If pt is not established with a provider, would they like to be referred to a provider to establish care? No .   Dental Screening: Recommended annual dental exams for proper oral hygiene   Community Resource Referral / Chronic Care Management: CRR required this visit?  No   CCM required this visit?  No     Plan:     I have personally reviewed and noted the following in the patient's chart:   Medical and social history Use of alcohol, tobacco or illicit drugs  Current medications and supplements including opioid prescriptions. Patient is not currently taking opioid prescriptions. Functional ability and status Nutritional status Physical activity Advanced directives List of other physicians Hospitalizations, surgeries, and ER visits in previous 12 months Vitals Screenings to include cognitive, depression, and falls Referrals and appointments  In addition, I have reviewed and discussed with patient certain preventive protocols, quality metrics, and  best practice recommendations. A written personalized care plan for preventive services as well as general preventive health recommendations were provided to patient.     Sydell Axon, LPN   81/19/1478   After Visit Summary: (In Person-Declined) Patient declined AVS at this time.  Nurse Notes: None

## 2023-03-20 ENCOUNTER — Ambulatory Visit: Payer: Medicare PPO | Admitting: Family

## 2023-08-08 ENCOUNTER — Encounter: Payer: Self-pay | Admitting: Emergency Medicine

## 2023-08-08 ENCOUNTER — Emergency Department
Admission: EM | Admit: 2023-08-08 | Discharge: 2023-08-08 | Disposition: A | Discharge status: Home / Self Care | Attending: Emergency Medicine | Admitting: Emergency Medicine

## 2023-08-08 ENCOUNTER — Other Ambulatory Visit: Payer: Self-pay

## 2023-08-08 DIAGNOSIS — M545 Low back pain, unspecified: Secondary | ICD-10-CM | POA: Diagnosis present

## 2023-08-08 DIAGNOSIS — B029 Zoster without complications: Secondary | ICD-10-CM | POA: Insufficient documentation

## 2023-08-08 MED ORDER — HYDROCODONE-ACETAMINOPHEN 5-325 MG PO TABS: 0.5000 | ORAL_TABLET | Freq: Four times a day (QID) | ORAL | 0 refills | Status: AC | PRN

## 2023-08-08 MED ORDER — VALACYCLOVIR HCL 1 G PO TABS: 1000.0000 mg | ORAL_TABLET | Freq: Three times a day (TID) | ORAL | 0 refills | Status: AC

## 2023-08-08 NOTE — Discharge Instructions (Signed)
 You were seen in the ER today for evaluation of your rash.  This seems most consistent with shingles.  Included more information about this in your paperwork.  I sent an antiviral medication to your pharmacy.  Please take this as directed.  You can use 650 mg of Tylenol and 600 mg of ibuprofen every 6 6 hours to help with your pain if there is another reason you should not take these.  I have also sent a short course of narcotic pain medicine to your pharmacy for breakthrough pain.  This can make you drowsy, do not drive or operate machinery when taking this.  Follow with your primary care doctor for further evaluation.  Return to the ER for new or worsening symptoms.

## 2023-08-08 NOTE — ED Triage Notes (Signed)
 Patient to ED via POV for left sided, lower back pain. PT reports pain since Sunday. States the pain radiates down the left leg. Ambulatory to triage.

## 2023-08-08 NOTE — ED Provider Notes (Signed)
 [facility] Provider Note    Event Date/Time   First MD Initiated Contact with Patient 08/08/23 1046     (approximate)   History   Back Pain   HPI  Fadil Macmaster is a 89 year old male presenting to the emergency department for evaluation of back discomfort.  Last night, patient noticed some discomfort over his lower back and thought that he may have a rash in the area.  Denies significant pain in the area currently. No recent fevers. No numbness, tingling, weakness.  No recent trauma.  No bowel or bladder incontinence or difficulty emptying bladder.       Physical Exam   Triage Vital Signs: ED Triage Vitals  Encounter Vitals Group     BP 08/08/23 1023 (!) 142/80     Systolic BP Percentile --      Diastolic BP Percentile --      Pulse Rate 08/08/23 1023 69     Resp 08/08/23 1023 18     Temp 08/08/23 1023 98.7 F (37.1 C)     Temp Source 08/08/23 1023 Oral     SpO2 08/08/23 1023 98 %     Weight 08/08/23 1022 160 lb 15 oz (73 kg)     Height 08/08/23 1022 5\' 10"  (1.778 m)     Head Circumference --      Peak Flow --      Pain Score 08/08/23 1023 5     Pain Loc --      Pain Education --      Exclude from Growth Chart --     Most recent vital signs: Vitals:   08/08/23 1023  BP: (!) 142/80  Pulse: 69  Resp: 18  Temp: 98.7 F (37.1 C)  SpO2: 98%     General: Awake, interactive  CV:  Regular rate, good peripheral perfusion.  Resp:  Unlabored respirations, lungs good auscultation Abd:  Nondistended, soft, nontender to palpation Back:   No midline tenderness, there is a vesicular rash in a dermatomal distribution over the right lower back.  There are fluid-filled vesicles present without active weeping.  There is no significant tenderness over this area. Neuro:  Symmetric facial movement, fluid speech, device rhythm bilateral lower extremities with normal sensation   ED Results / Procedures / Treatments   Labs (all labs ordered are listed,  but only abnormal results are displayed) Labs Reviewed - No data to display   EKG EKG independently reviewed interpreted by myself (ER attending) demonstrates:    RADIOLOGY Imaging independently reviewed and interpreted by myself demonstrates:    PROCEDURES:  Critical Care performed: No  Procedures   MEDICATIONS ORDERED IN ED: Medications - No data to display   IMPRESSION / MDM / ASSESSMENT AND PLAN / ED COURSE  I reviewed the triage vital signs and the nursing notes.  Differential diagnosis includes, but is not limited to, shingles, cellulitis, no red flag back pain symptoms, no evidence of spinal cord compression  Patient's presentation is most consistent with acute, uncomplicated illness.  89 year old male presenting with back discomfort and rash.  Exam consistent with shingles.  Fortunately not significantly tender here, presents within 24 hours of onset.  Will DC on valacyclovir.  Discussed multimodal pain control.  Will DC with short course of pain medication.  Strict return precautions provided.  Patient discharged stable condition.      FINAL CLINICAL IMPRESSION(S) / ED DIAGNOSES   Final diagnoses:  Herpes zoster without complication     Rx /  DC Orders   ED Discharge Orders          Ordered    HYDROcodone-acetaminophen (NORCO/VICODIN) 5-325 MG tablet  Every 6 hours PRN        08/08/23 1120    valACYclovir (VALTREX) 1000 MG tablet  Every 8 hours        08/08/23 1120             Note:  This document was prepared using Dragon voice recognition software and may include unintentional dictation errors.   Trinna Post, MD 08/08/23 1120

## 2023-08-08 NOTE — ED Notes (Signed)
 Pt states that he noticed Sunday night before he went to bed that something on the lower right side of his back felt rough and blistery, pt denies any pain at this time, pt denies a hx of shingles  Pt has a blistery area at the top of the cleft of his buttocks, pt has a large amount of blistery red area to the right buttocks, pt denies tenderness to touch

## 2023-08-10 ENCOUNTER — Ambulatory Visit: Admitting: Family

## 2023-08-10 ENCOUNTER — Encounter: Payer: Self-pay | Admitting: Family

## 2023-08-10 VITALS — BP 128/76 | HR 87 | Temp 97.8°F | Ht 67.0 in | Wt 157.2 lb

## 2023-08-10 DIAGNOSIS — I10 Essential (primary) hypertension: Secondary | ICD-10-CM | POA: Diagnosis not present

## 2023-08-10 DIAGNOSIS — B029 Zoster without complications: Secondary | ICD-10-CM | POA: Diagnosis not present

## 2023-08-10 DIAGNOSIS — R413 Other amnesia: Secondary | ICD-10-CM

## 2023-08-10 MED ORDER — DONEPEZIL HCL 5 MG PO TABS: 5.0000 mg | ORAL_TABLET | Freq: Every day | ORAL | 3 refills | Status: DC

## 2023-08-10 MED ORDER — VITAMIN B-12 1000 MCG PO TABS
1000.0000 ug | ORAL_TABLET | Freq: Every day | ORAL | 3 refills | Status: AC
Start: 2023-08-10 — End: ?

## 2023-08-10 NOTE — Patient Instructions (Addendum)
 Start aricept for memory changes , dementia  You may increase to 10 mg once daily after 4 to 6 weeks.  Please let me know if significant nausea or weight loss on medication.  Please take with food.   As discussed, I would strongly advise you to stop driving for your safety and those around you.   Referral to neurology  Let us know if you dont hear back within a week in regards to an appointment being scheduled.   So that you are aware, if you are [facility] MyChart user , please pay attention to your MyChart messages as you may receive a MyChart message with a phone number to call and schedule this test/appointment own your own from our referral coordinator. This is a new process so I do not want you to miss this message.  If you are not a MyChart user, you will receive a phone call.    Donepezil Tablets What is this medication? DONEPEZIL (doe NEP e zil) treats memory loss and confusion (dementia) in people who have Alzheimer disease. It works by improving attention, memory, and the ability to engage in daily activities. It is not a cure for dementia or Alzheimer disease. This medicine may be used for other purposes; ask your health care provider or pharmacist if you have questions. COMMON BRAND NAME(S): Aricept What should I tell my care team before I take this medication? They need to know if you have any of these conditions: Head injury Heart disease Irregular heartbeat or rhythm Liver disease Lung or breathing disease, such as asthma Seizures Stomach ulcers, other stomach or intestine problems Stomach bleeding Trouble passing urine An unusual or allergic reaction to donepezil, other medications, foods, dyes, or preservatives Pregnant or trying to get pregnant Breastfeeding How should I use this medication? Take this medication by mouth with a glass of water. Follow the directions on the prescription label. You may take this medication with or without food. Take this medication at  regular intervals. This medication is usually taken before bedtime. Do not take it more often than directed. Continue to take your medication even if you feel better. Do not stop taking except on your care team's advice. If you are taking the 23 mg donepezil tablet, swallow it whole; do not cut, crush, or chew it. Talk to your care team about the use of this medication in children. Special care may be needed. Overdosage: If you think you have taken too much of this medicine contact a poison control center or emergency room at once. NOTE: This medicine is only for you. Do not share this medicine with others. What if I miss a dose? If you miss a dose, take it as soon as you can. If it is almost time for your next dose, take only that dose, do not take double or extra doses. What may interact with this medication? Do not take this medication with any of the following: Certain medications for fungal infections, such as itraconazole, fluconazole, posaconazole, voriconazole Cisapride Dextromethorphan; quinidine Dronedarone Pimozide Quinidine Thioridazine This medication may also interact with the following: Antihistamines for allergy, cough, and cold Atropine Bethanechol Carbamazepine Certain medications for bladder problems, such as oxybutynin or tolterodine Certain medications for Parkinson disease, such as benztropine or trihexyphenidyl Certain medications for stomach problems, such as dicyclomine or hyoscyamine Certain medications for travel sickness, such as scopolamine Dexamethasone Dofetilide Ipratropium NSAIDs, medications for pain and inflammation, such as ibuprofen or naproxen Other medications for Alzheimer disease Other medications that cause heart  rhythm changes Phenobarbital Phenytoin Rifampin, rifabutin, or rifapentine Ziprasidone This list may not describe all possible interactions. Give your health care provider a list of all the medicines, herbs, non-prescription drugs,  or dietary supplements you use. Also tell them if you smoke, drink alcohol, or use illegal drugs. Some items may interact with your medicine. What should I watch for while using this medication? Visit your care team for regular checks on your progress. Tell your care team if your symptoms do not start to get better or if they get worse. This medication may affect your coordination, reaction time, or judgment. Do not drive or operate machinery until you know how this medication affects you. Sit up or stand slowly to reduce the risk of dizzy or fainting spells. Drinking alcohol with this medication can increase the risk of these side effects. What side effects may I notice from receiving this medication? Side effects that you should report to your care team as soon as possible: Allergic reactions--skin rash, itching, hives, swelling of the face, lips, tongue, or throat Peptic ulcer--burning stomach pain, loss of appetite, bloating, burping, heartburn, nausea, vomiting Seizures Slow heartbeat--dizziness, feeling faint or lightheaded, confusion, trouble breathing, unusual weakness or fatigue Stomach bleeding--bloody or black, tar-like stools, vomiting blood or brown material that looks like coffee grounds Trouble passing urine Side effects that usually do not require medical attention (report these to your care team if they continue or are bothersome): Diarrhea Fatigue Loss of appetite Muscle pain or cramps Nausea Trouble sleeping This list may not describe all possible side effects. Call your doctor for medical advice about side effects. You may report side effects to FDA at 1-800-FDA-1088. Where should I keep my medication? Keep out of reach of children. Store at room temperature between 15 and 30 degrees C (59 and 86 degrees F). Throw away any unused medication after the expiration date. NOTE: This sheet is a summary. It may not cover all possible information. If you have questions about this  medicine, talk to your doctor, pharmacist, or health care provider.  2024 Elsevier/Gold Standard (2022-11-10 00:00:00)

## 2023-08-10 NOTE — Assessment & Plan Note (Signed)
 Presentation c/w herpes zoster.  Pain controlled. Advised to complete valtrex. He will need Shingrex vaccine was resolved.

## 2023-08-10 NOTE — Assessment & Plan Note (Addendum)
 20/30.  Discussed concern for vascular dementia. Reviewed previous 06/2021 MRI brain mild-to-moderate small vessel ischemia,small chronic infarct. Continue asa 81 mg every day.  Advised b12 every day.  We agreed to start aricept.  Referral to neurology for further evaluation.  Advised to stop driving for his safety and for those around him.

## 2023-08-10 NOTE — Progress Notes (Signed)
 Assessment & Plan:  Memory changes Assessment & Plan: 20/30.  Discussed concern for vascular dementia. Reviewed previous 06/2021 MRI brain mild-to-moderate small vessel ischemia,small chronic infarct. Continue asa 81 mg every day.  Advised b12 every day.  We agreed to start aricept.  Referral to neurology for further evaluation.  Advised to stop driving for his safety and for those around him.   Orders: -     Comprehensive metabolic panel with GFR -     Q03 and Folate Panel -     Ambulatory referral to Neurology -     Donepezil HCl; Take 1 tablet (5 mg total) by mouth at bedtime.  Dispense: 90 tablet; Refill: 3 -     Vitamin B-12; Take 1 tablet (1,000 mcg total) by mouth daily.  Dispense: 90 tablet; Refill: 3 -     Urinalysis, Routine w reflex microscopic  Herpes zoster without complication Assessment & Plan: Presentation c/w herpes zoster.  Pain controlled. Advised to complete valtrex. He will need Shingrex vaccine was resolved.    Essential hypertension Assessment & Plan: Chronic, stable.  Continue losartan 25 mg daily, metoprolol succinate 50 mg daily, amlodipine 10 mg QD      Return precautions given.   Risks, benefits, and alternatives of the medications and treatment plan prescribed today were discussed, and patient expressed understanding.   Education regarding symptom management and diagnosis given to patient on AVS either electronically or printed.  Return in about 6 weeks (around 09/21/2023).  Rennie Plowman, FNP  Subjective:    Patient ID: Joshua Sandoval, male    DOB: January 09, 1933, 89 y.o.   MRN: [id number]  CC: Joshua Sandoval is a 89 y.o. male who presents today for follow up.   HPI: Patient feels well today. Reports painful rash on right lower back, buttocks which has improved. Noticed 4 days ago.   He is taking valacyclovir and using calamine lotion for pain.   Wife shares concerns for hygiene, financial indiscretion . For example he has forgotten to pay bills.  He also will forget a grandchild's name.   Patient denies getting lost or forgetting loves ones names.   He continues to drive  He enjoys working.   Denies fever, dysuria       Seen 08/08/23 for shingles , given valacyclovir, hydrocodone as needed   seen by neurosurgery, Dr. Myer Haff 08/31/2021 for cerebral artery aneurysm.   MR angiogram 11/30/21,per Dr Myer Haff stable and no recommend follow-up Allergies: Other Current Outpatient Medications on File Prior to Visit  Medication Sig Dispense Refill   amLODipine (NORVASC) 10 MG tablet TAKE 1 TABLET(10 MG) BY MOUTH DAILY 90 tablet 3   aspirin EC 81 MG tablet Take 81 mg by mouth as needed.      gabapentin (NEURONTIN) 100 MG capsule Take 1 capsule (100 mg total) by mouth at bedtime. 90 capsule 3   losartan (COZAAR) 25 MG tablet Take 1 tablet (25 mg total) by mouth daily. 90 tablet 3   metoprolol succinate (TOPROL-XL) 50 MG 24 hr tablet TAKE 1 TABLET(50 MG) BY MOUTH DAILY WITH OR IMMEDIATELY FOLLOWING A MEAL 90 tablet 3   rosuvastatin (CRESTOR) 5 MG tablet Take 1 tablet (5 mg total) by mouth daily. 90 tablet 3   valACYclovir (VALTREX) 1000 MG tablet Take 1 tablet (1,000 mg total) by mouth every 8 (eight) hours for 7 days. 21 tablet 0   No current facility-administered medications on file prior to visit.    Review of Systems  Constitutional:  Negative  for chills and fever.  Respiratory:  Negative for cough.   Cardiovascular:  Negative for chest pain and palpitations.  Gastrointestinal:  Negative for nausea and vomiting.  Skin:  Positive for rash.  Neurological:  Negative for headaches.      Objective:    BP 128/76   Pulse 87   Temp 97.8 F (36.6 C) (Oral)   Ht 5\' 7"  (1.702 m)   Wt 157 lb 3.2 oz (71.3 kg)   SpO2 98%   BMI 24.62 kg/m  BP Readings from Last 3 Encounters:  08/10/23 128/76  08/08/23 (!) 142/80  03/06/23 128/70   Wt Readings from Last 3 Encounters:  08/10/23 157 lb 3.2 oz (71.3 kg)  08/08/23 160 lb 15 oz  (73 kg)  03/06/23 162 lb 6 oz (73.7 kg)    Physical Exam Vitals reviewed.  Constitutional:      Appearance: He is well-developed.  Cardiovascular:     Rate and Rhythm: Regular rhythm.     Heart sounds: Normal heart sounds.  Pulmonary:     Effort: Pulmonary effort is normal. No respiratory distress.     Breath sounds: Normal breath sounds. No wheezing, rhonchi or rales.  Skin:    General: Skin is warm and dry.          Comments: Grouped non tender dry scabbed over lesions right buttocks. Calamine lotion applied. No vesicular lesions, purulent discharge.   Neurological:     Mental Status: He is alert.  Psychiatric:        Speech: Speech normal.        Behavior: Behavior normal.

## 2023-08-11 LAB — COMPREHENSIVE METABOLIC PANEL WITH GFR
ALT: 11 U/L (ref 0–53)
AST: 21 U/L (ref 0–37)
Albumin: 4.4 g/dL (ref 3.5–5.2)
Alkaline Phosphatase: 46 U/L (ref 39–117)
BUN: 18 mg/dL (ref 6–23)
CO2: 26 meq/L (ref 19–32)
Calcium: 9.9 mg/dL (ref 8.4–10.5)
Chloride: 98 meq/L (ref 96–112)
Creatinine, Ser: 1.23 mg/dL (ref 0.40–1.50)
GFR: 52.31 mL/min — ABNORMAL LOW (ref >60.00)
Glucose, Bld: 115 mg/dL — ABNORMAL HIGH (ref 70–99)
Potassium: 4.8 meq/L (ref 3.5–5.1)
Sodium: 136 meq/L (ref 135–145)
Total Bilirubin: 0.9 mg/dL (ref 0.2–1.2)
Total Protein: 7.7 g/dL (ref 6.0–8.3)

## 2023-08-11 LAB — URINALYSIS, ROUTINE W REFLEX MICROSCOPIC
Bilirubin Urine: NEGATIVE
Hgb urine dipstick: NEGATIVE
Ketones, ur: NEGATIVE
Leukocytes,Ua: NEGATIVE
Nitrite: NEGATIVE
Specific Gravity, Urine: 1.025 (ref 1.000–1.030)
Urine Glucose: NEGATIVE
Urobilinogen, UA: 0.2 (ref 0.0–1.0)
pH: 5.5 (ref 5.0–8.0)

## 2023-08-11 LAB — B12 AND FOLATE PANEL
Folate: 10.1 ng/mL (ref >5.9)
Vitamin B-12: 160 pg/mL — ABNORMAL LOW (ref 211–911)

## 2023-08-15 ENCOUNTER — Encounter: Payer: Self-pay | Admitting: Family

## 2023-08-15 NOTE — Assessment & Plan Note (Signed)
Chronic, stable.  Continue losartan 25 mg daily, metoprolol succinate 50 mg daily, amlodipine 10 mg QD 

## 2023-08-16 ENCOUNTER — Ambulatory Visit: Admitting: Family

## 2023-08-17 ENCOUNTER — Telehealth: Payer: Self-pay

## 2023-08-17 ENCOUNTER — Ambulatory Visit (INDEPENDENT_AMBULATORY_CARE_PROVIDER_SITE_OTHER): Admitting: Nurse Practitioner

## 2023-08-17 ENCOUNTER — Encounter: Payer: Self-pay | Admitting: Nurse Practitioner

## 2023-08-17 VITALS — BP 136/72 | HR 80 | Temp 97.3°F | Ht 67.0 in | Wt 153.6 lb

## 2023-08-17 DIAGNOSIS — B029 Zoster without complications: Secondary | ICD-10-CM | POA: Diagnosis not present

## 2023-08-17 DIAGNOSIS — R531 Weakness: Secondary | ICD-10-CM

## 2023-08-17 DIAGNOSIS — R413 Other amnesia: Secondary | ICD-10-CM | POA: Diagnosis not present

## 2023-08-17 DIAGNOSIS — Z0110 Encounter for examination of ears and hearing without abnormal findings: Secondary | ICD-10-CM | POA: Diagnosis not present

## 2023-08-17 NOTE — Telephone Encounter (Signed)
 Copied from CRM [id number]. Topic: Appointments - Appointment Scheduling >> Aug 17, 2023  9:07 AM Arley Phenix D wrote: Patient's daughter is calling to schedule a shingles follow up with Dr. Jason Coop. Daughter also stated that the patient stated that he is feeling weak, has no energy and is moving around slowly. Daughter declined speaking with NT and wanted to make the appointment. Daughters name is Lybrand and call back number is [phone].

## 2023-08-17 NOTE — Telephone Encounter (Signed)
 Call daughter/wifr and pt   Please get more information in regard to weakness  Please sch appt for tomorrow unless unstable and needs to be seen today at [facility] or ED   Any change in mental status or confusion? Is he eating /drinking?  Does he have fever, CP, sob?  I am concerned and he would need re evaluation for safety. We didn't discuss weakness at last visit.   We discussed rash and dementia.

## 2023-08-17 NOTE — Progress Notes (Signed)
 Established Patient Office Visit  Subjective:  Patient ID: Joshua Sandoval, male    DOB: Apr 11, 1933  Age: 89 y.o. MRN: [id number]  CC:  Chief Complaint  Patient presents with   Extremity Weakness    No energy & discuss Aricept Should Patient take B12 OTC?  Discussed the use of a AI scribe software for clinical note transcription with the patient, who gave verbal consent to proceed.   HPI Joshua Sandoval is an 89 year old male who presents for experiences fatigue, low energy and clearance to return to work after having shingles. 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 He is accompanied by his daughter, who is visiting from [place].  He was recently started on Aricept 5 mg for memory concerns but has only taken it twice due to adverse effects, including feeling 'cloudy' and possibly nauseous. He is hesitant to continue the medication as it affects his energy levels and prefers to maintain his usual active lifestyle. He also takes medication for heart and blood pressure but is concerned about the number of medications he is on.  He has been advised not to drive due to mild dementia, which was identified through a mini-mental exam. He acknowledges not driving at night and limits his driving to essential trips, such as going to work or running errands. He is concerned about losing his independence if he stops driving entirely.   HPI   Past Medical History:  Diagnosis Date   Diastolic dysfunction    a. 12/2017 Echo: EF 50-55%, no rwma, Gr1 DD, nl RV fxn; b. 03/2020 Echo: EF 55-60%, no rwma, Gr1 DD, Mildly red RV fxn, triv MR, mild to mod AoV sclerosis w/o stenosis.   History of stress test    a. 01/2018 MV: EF 55%. No ischemia/infarct-->Low risk.   Hypertension    PVC's (premature ventricular contractions)    a. 02/2018 Holter: RSR, >7k PVCs (15% burden). 641 runs of NSVT up to 6 beats.   RBBB     Past Surgical History:  Procedure Laterality Date   CATARACT EXTRACTION  W/PHACO Right 12/27/2022   Procedure: CATARACT EXTRACTION PHACO AND INTRAOCULAR LENS PLACEMENT (IOC) RIGHT 8.98 01:03.7;  Surgeon: Clair Crews, MD;  Location: [facility];  Service: Ophthalmology;  Laterality: Right;   CATARACT EXTRACTION W/PHACO Left 01/24/2023   Procedure: CATARACT EXTRACTION PHACO AND INTRAOCULAR LENS PLACEMENT (IOC) LEFT 10.65 00:54.0;  Surgeon: Clair Crews, MD;  Location: [facility];  Service: Ophthalmology;  Laterality: Left;   NO PAST SURGERIES      Family History  Problem Relation Age of Onset   Hypertension Mother    Stroke Mother     Social History   Socioeconomic History   Marital status: Married    Spouse name: Not on file   Number of children: Not on file   Years of education: Not on file   Highest education level: Not on file  Occupational History   Not on file  Tobacco Use   Smoking status: Former    Current packs/day: 0.00    Types: Cigarettes    Quit date: 1980    Years since quitting: 45.3   Smokeless tobacco: Never  Vaping Use   Vaping status: Never Used  Substance and Sexual Activity   Alcohol use: Not Currently   Drug use: No   Sexual activity: Yes    Partners: Female  Other Topics Concern   Not on file  Social History Narrative  married   Social Drivers of Corporate investment banker Strain: Low Risk  (03/06/2023)   Overall Financial Resource Strain (CARDIA)    Difficulty of Paying Living Expenses: Not hard at all  Food Insecurity: No Food Insecurity (03/06/2023)   Hunger Vital Sign    Worried About Running Out of Food in the Last Year: Never true    Ran Out of Food in the Last Year: Never true  Transportation Needs: No Transportation Needs (03/06/2023)   PRAPARE - Administrator, Civil Service (Medical): No    Lack of Transportation (Non-Medical): No  Physical Activity: Insufficiently Active (03/06/2023)   Exercise Vital Sign    Days of Exercise per Week: 2 days    Minutes of  Exercise per Session: 40 min  Stress: No Stress Concern Present (03/06/2023)   [employer] of Occupational Health - Occupational Stress Questionnaire    Feeling of Stress : Not at all  Social Connections: Moderately Integrated (03/06/2023)   Social Connection and Isolation Panel [NHANES]    Frequency of Communication with Friends and Family: More than three times a week    Frequency of Social Gatherings with Friends and Family: More than three times a week    Attends Religious Services: More than 4 times per year    Active Member of [employer] or Organizations: No    Attends Banker Meetings: Never    Marital Status: Married  Catering manager Violence: Not At Risk (03/06/2023)   Humiliation, Afraid, Rape, and Kick questionnaire    Fear of Current or Ex-Partner: No    Emotionally Abused: No    Physically Abused: No    Sexually Abused: No     Outpatient Medications Prior to Visit  Medication Sig Dispense Refill   amLODipine (NORVASC) 10 MG tablet TAKE 1 TABLET(10 MG) BY MOUTH DAILY 90 tablet 3   aspirin EC 81 MG tablet Take 81 mg by mouth as needed.      cyanocobalamin (VITAMIN B12) 1000 MCG tablet Take 1 tablet (1,000 mcg total) by mouth daily. 90 tablet 3   donepezil (ARICEPT) 5 MG tablet Take 1 tablet (5 mg total) by mouth at bedtime. 90 tablet 3   gabapentin (NEURONTIN) 100 MG capsule Take 1 capsule (100 mg total) by mouth at bedtime. 90 capsule 3   losartan (COZAAR) 25 MG tablet Take 1 tablet (25 mg total) by mouth daily. 90 tablet 3   metoprolol succinate (TOPROL-XL) 50 MG 24 hr tablet TAKE 1 TABLET(50 MG) BY MOUTH DAILY WITH OR IMMEDIATELY FOLLOWING A MEAL 90 tablet 3   rosuvastatin (CRESTOR) 5 MG tablet Take 1 tablet (5 mg total) by mouth daily. 90 tablet 3   No facility-administered medications prior to visit.    Allergies  Allergen Reactions   Other     NKDA    ROS Review of Systems Negative unless indicated in HPI.    Objective:    Physical  Exam  BP 136/72 (Cuff Size: Normal)   Pulse 80   Temp (!) 97.3 F (36.3 C)   Ht 5\' 7"  (1.702 m)   Wt 153 lb 9.6 oz (69.7 kg)   SpO2 98%   BMI 24.06 kg/m  Wt Readings from Last 3 Encounters:  08/17/23 153 lb 9.6 oz (69.7 kg)  08/10/23 157 lb 3.2 oz (71.3 kg)  08/08/23 160 lb 15 oz (73 kg)     Health Maintenance  Topic Date Due   DTaP/Tdap/Td (1 - Tdap) Never done  COVID-19 Vaccine (3 - 2024-25 season) 09/02/2023 (Originally 01/15/2023)   Zoster Vaccines- Shingrix (1 of 2) 11/10/2023 (Originally 10/21/1984)   Pneumonia Vaccine 83+ Years old (2 of 2 - PPSV23) 08/09/2024 (Originally 01/01/2020)   INFLUENZA VACCINE  12/15/2023   Medicare Annual Wellness (AWV)  03/05/2024   HPV VACCINES  Aged Out   Meningococcal B Vaccine  Aged Out    There are no preventive care reminders to display for this patient.  Lab Results  Component Value Date   TSH 2.16 10/14/2022   Lab Results  Component Value Date   WBC 6.9 10/14/2022   HGB 14.3 10/14/2022   HCT 43.7 10/14/2022   MCV 85.9 10/14/2022   PLT 167.0 10/14/2022   Lab Results  Component Value Date   NA 136 08/10/2023   K 4.8 08/10/2023   CO2 26 08/10/2023   GLUCOSE 115 (H) 08/10/2023   BUN 18 08/10/2023   CREATININE 1.23 08/10/2023   BILITOT 0.9 08/10/2023   ALKPHOS 46 08/10/2023   AST 21 08/10/2023   ALT 11 08/10/2023   PROT 7.7 08/10/2023   ALBUMIN 4.4 08/10/2023   CALCIUM 9.9 08/10/2023   EGFR 68 03/23/2021   GFR 52.31 (L) 08/10/2023   Lab Results  Component Value Date   CHOL 255 (H) 10/14/2022   Lab Results  Component Value Date   HDL 63.50 10/14/2022   Lab Results  Component Value Date   LDLCALC 175 (H) 10/14/2022   Lab Results  Component Value Date   TRIG 83.0 10/14/2022   Lab Results  Component Value Date   CHOLHDL 4 10/14/2022   Lab Results  Component Value Date   HGBA1C 6.5 10/14/2022      Assessment & Plan:  Weakness generalized  Evaluation of hearing impairment Assessment &  Plan: Family thinks has potential hearing loss. Referral to audiologist discussed. - Refer to audiologist for hearing evaluation.  Orders: -     Ambulatory referral to ENT  Herpes zoster without complication Assessment & Plan: Lesions crusted and healing. No pain, but fatigue persists. Cleared to resume work. - Allow return to work on Monday. - Provide work note for absence from March 25 to April 4.    Memory changes Assessment & Plan: Non-compliance with Aricept due to side effects. Discussed taking medication at bedtime to reduce side effects. Family encouraged to monitor. - Encourage taking Aricept at bedtime to minimize side effects. - Discuss potential benefits of Aricept in slowing memory decline. - Advise against driving due to safety concerns.     Follow-up: Return if symptoms worsen or fail to improve.   Maclain Cohron, NP

## 2023-08-17 NOTE — Telephone Encounter (Signed)
 Spoke to pt daughter he is ansy and tired  because he wants to go back to work per daughter does not think it is ED warranted scheduled him to see Mallie Snooks today @ 11:40

## 2023-08-17 NOTE — Patient Instructions (Addendum)
 Take Aricept at bed time and let us know if having side effect. Take B12 supplement 1000 mcg daily.

## 2023-08-21 ENCOUNTER — Ambulatory Visit: Payer: Self-pay

## 2023-08-21 NOTE — Telephone Encounter (Signed)
 Noted  Call pt  If he begins to feel lightheaded, dizzy , please advise ED for monitoring. Medication is extended release so tomorrow he should be okay to resume toprol 50mg  ONCE daily   Risk of toprol 100 mg would be low blood pressure, low heart rate

## 2023-08-21 NOTE — Telephone Encounter (Signed)
 LVM to inform pt wife Joshua Sandoval of following message, please relay when pt Wife calls back   If he begins to feel lightheaded, dizzy , please advise ED for monitoring. Medication is extended release so tomorrow he should be okay to resume toprol 50mg  ONCE daily     Risk of toprol 100 mg would be low blood pressure, low heart rate

## 2023-08-21 NOTE — Telephone Encounter (Signed)
 Spoke to pt wife she stated that he took 2 Metoprolol instead of 1 like he normally takes, she stated that she took his bp it was 136/86 heartrate was 71, he is doing his normal activities as of right now she will continue to monitor, will call back if need be

## 2023-08-21 NOTE — Telephone Encounter (Signed)
  Chief Complaint: Medication problem Symptoms: Asymptomatic-patient reports take a double dose of one of his cardiac medications but couldn't say which one Frequency: occurred today Pertinent Negatives: Patient denies CP, SOB Disposition: [] ED /[] Urgent Care (no appt availability in office) / [] Appointment(In office/virtual)/ []  [facility]/ [] Home Care/ [] Refused Recommended Disposition /[]  Mobile Bus/ [x]  Follow-up with PCP Additional Notes: patient's daughter called for patient. Patient called daughter to let her know that he took a double dose of one of his heart medications. Patient couldn't tell her which medication he took a double dose dose. Patient is one amlodipine, metoprolol extended release and Losartan. Daughter had patient on the other line. Patient states he felt weird but couldn't give any more information. Daughter states her father is less likely to give good detail about how he is feeling. Possible side effects of medications are told to daughter and patient. Daughter and patient are instructed to call Poison Control for guidance. Instructed to check patient's blood pressure and heart rate. National phone number for poison control was given along with the instructions that the number will go to the local poison control center. Daughter verbalized understanding of plan and all questions answered.    Copied from CRM [id number]. Topic: Clinical - Red Word Triage >> Aug 21, 2023 10:34 AM Drema Balzarine wrote: Red Word that prompted transfer to Nurse Triage: Patient daughter Petrosyan calling regarding patient taking 2 doses of his heart medications and wants to make sure this is okay. Says its the amLODipine or the metoprolol. Reason for Disposition  [1] DOUBLE DOSE (an extra dose or lesser amount) of prescription drug AND [2] NO symptoms  (Exception: A double dose of antibiotics.)  Answer Assessment - Initial Assessment Questions 1. NAME of MEDICINE: "What medicine(s)  are you calling about?"     One of cardiac medications 2. QUESTION: "What is your question?" (e.g., double dose of medicine, side effect)     Patient is concerned that he took two of his heart medications but unsure of which he took two of 3. PRESCRIBER: "Who prescribed the medicine?" Reason: if prescribed by specialist, call should be referred to that group.     Rennie Plowman FNP 4. SYMPTOMS: "Do you have any symptoms?" If Yes, ask: "What symptoms are you having?"  "How bad are the symptoms (e.g., mild, moderate, severe)     Patient states he feels different but unsure.  Protocols used: Medication Question Call-A-AH

## 2023-08-22 NOTE — Telephone Encounter (Signed)
 noted

## 2023-08-28 NOTE — Assessment & Plan Note (Signed)
 Family thinks has potential hearing loss. Referral to audiologist discussed. - Refer to audiologist for hearing evaluation.

## 2023-08-28 NOTE — Assessment & Plan Note (Addendum)
 Non-compliance with Aricept due to side effects. Discussed taking medication at bedtime to reduce side effects. Family encouraged to monitor. - Encourage taking Aricept at bedtime to minimize side effects. - Discuss potential benefits of Aricept in slowing memory decline. - Advise against driving due to safety concerns.

## 2023-08-28 NOTE — Assessment & Plan Note (Signed)
 Lesions crusted and healing. No pain, but fatigue persists. Cleared to resume work. - Allow return to work on Monday. - Provide work note for absence from March 25 to April 4.

## 2023-08-31 ENCOUNTER — Telehealth

## 2023-08-31 ENCOUNTER — Encounter: Payer: Self-pay | Admitting: Physician Assistant

## 2023-08-31 ENCOUNTER — Ambulatory Visit: Payer: Self-pay | Admitting: Family

## 2023-08-31 NOTE — Telephone Encounter (Signed)
 Chief Complaint: Pain in right leg from hip down where he had shingles started in last 3-4 days  Symptoms: Not eating, depressed, weakness, stomach pain, losing weight  Disposition: [x] Urgent Care (no appt availability in office)  Additional Notes: Spoke with pt's wife, Sydna Evangelist. Per pt wife he has dementia and thinks he is eating but he is not. Pt will take a bite of something and think he is full. Pt wife states he is depressed. Pt wife states he is in pain from the shingles in his right leg. Pt is staying hydrated per pt wife. Pt scheduled for a virtual urgent care visit today as no appointment availability at PCP office tomorrow. This RN educated pt wife on when to call back/seek emergent care. Pt verbalized understanding and agrees to plan.    Copied from CRM [id number]. Topic: Clinical - Red Word Triage >> Aug 31, 2023  5:56 PM Clyde Darling P wrote: Red Word that prompted transfer to Nurse Triage: pt is in pain and not eating Reason for Disposition  SEVERE weight loss (e.g., BMI < 15, current weight < 85% of healthy body weight, rapid weight loss, complete food refusal)  Answer Assessment - Initial Assessment Questions Chief Complaint: Pain in right leg from hip down where he had shingles started in last 3-4 days  Symptoms: Not eating, depressed, weakness, stomach pain, losing weight  Protocols used: Eating Disorders Symptoms and Questions-A-AH

## 2023-09-01 ENCOUNTER — Ambulatory Visit: Payer: Self-pay

## 2023-09-01 ENCOUNTER — Telehealth: Payer: Self-pay | Admitting: Family

## 2023-09-01 NOTE — Telephone Encounter (Signed)
 Copied from CRM [id number]. Topic: Clinical - Medical Advice >> Sep 01, 2023  2:47 PM Alexandria E wrote: Reason for CRM: Patient's wife is questioning if patient is able to take Aleve for pain. Did not want to be triaged again, as patient was triaged yesterday. Just wondering if Aleve will be okay for the patient to take. Callback number for wife is [phone].  First call attempted; left vm.

## 2023-09-01 NOTE — Telephone Encounter (Signed)
 Based off earlier note stated pt is seeking medication advice and does not want/refuses to be triaged - will route route information to PCP office in order for PCP to be made aware.

## 2023-09-02 ENCOUNTER — Telehealth: Admitting: Nurse Practitioner

## 2023-09-02 DIAGNOSIS — B0229 Other postherpetic nervous system involvement: Secondary | ICD-10-CM | POA: Diagnosis not present

## 2023-09-02 MED ORDER — GABAPENTIN 100 MG PO CAPS
100.0000 mg | ORAL_CAPSULE | Freq: Three times a day (TID) | ORAL | 0 refills | Status: DC
Start: 2023-09-02 — End: 2023-10-04

## 2023-09-02 NOTE — Patient Instructions (Signed)
  Joshua Sandoval, thank you for joining Collins Dean, NP for today's virtual visit.  While this provider is not your primary care provider (PCP), if your PCP is located in our provider database this encounter information will be shared with them immediately following your visit.   A [facility] MyChart account gives you access to today's visit and all your visits, tests, and labs performed at [facility] " click here if you don't have a [facility] MyChart account or go to [email or web address]  Consent: (Patient) Joshua Sandoval provided verbal consent for this virtual visit at the beginning of the encounter.  Current Medications:  Current Outpatient Medications:    amLODipine  (NORVASC ) 10 MG tablet, TAKE 1 TABLET(10 MG) BY MOUTH DAILY, Disp: 90 tablet, Rfl: 3   aspirin EC 81 MG tablet, Take 81 mg by mouth as needed. , Disp: , Rfl:    cyanocobalamin  (VITAMIN B12) 1000 MCG tablet, Take 1 tablet (1,000 mcg total) by mouth daily., Disp: 90 tablet, Rfl: 3   donepezil  (ARICEPT ) 5 MG tablet, Take 1 tablet (5 mg total) by mouth at bedtime., Disp: 90 tablet, Rfl: 3   gabapentin  (NEURONTIN ) 100 MG capsule, Take 1 capsule (100 mg total) by mouth 3 (three) times daily for 15 days., Disp: 45 capsule, Rfl: 0   losartan  (COZAAR ) 25 MG tablet, Take 1 tablet (25 mg total) by mouth daily., Disp: 90 tablet, Rfl: 3   metoprolol  succinate (TOPROL -XL) 50 MG 24 hr tablet, TAKE 1 TABLET(50 MG) BY MOUTH DAILY WITH OR IMMEDIATELY FOLLOWING A MEAL, Disp: 90 tablet, Rfl: 3   rosuvastatin  (CRESTOR ) 5 MG tablet, Take 1 tablet (5 mg total) by mouth daily., Disp: 90 tablet, Rfl: 3   Medications ordered in this encounter:  Meds ordered this encounter  Medications   gabapentin  (NEURONTIN ) 100 MG capsule    Sig: Take 1 capsule (100 mg total) by mouth 3 (three) times daily for 15 days.    Dispense:  45 capsule    Refill:  0    Supervising Provider:   Corine Dice [[id number]]     *If you need refills on other  medications prior to your next appointment, please contact your pharmacy*  Follow-Up: Call back or seek an in-person evaluation if the symptoms worsen or if the condition fails to improve as anticipated.  [facility] [phone]  Other Instructions May also try capsaicin cream over the counter   If you have been instructed to have an in-person evaluation today at a local Urgent Care facility, please use the link below. It will take you to a list of all of our available [facility], including address, phone number and hours of operation. Please do not delay care.  [facility]  If you or a family member do not have a primary care provider, use the link below to schedule a visit and establish care. When you choose a [facility] primary care physician or advanced practice provider, you gain a long-term partner in health. Find a Primary Care Provider  Learn more about [facility] in-office and virtual care options:  - Get Care Now

## 2023-09-02 NOTE — Progress Notes (Signed)
 Virtual Visit Consent   Joshua Sandoval, you are scheduled for a virtual visit with a  provider today. Just as with appointments in the office, your consent must be obtained to participate. Your consent will be active for this visit and any virtual visit you may have with one of our providers in the next 365 days. If you have a MyChart account, a copy of this consent can be sent to you electronically.  As this is a virtual visit, video technology does not allow for your provider to perform a traditional examination. This may limit your provider's ability to fully assess your condition. If your provider identifies any concerns that need to be evaluated in person or the need to arrange testing (such as labs, EKG, etc.), we will make arrangements to do so. Although advances in technology are sophisticated, we cannot ensure that it will always work on either your end or our end. If the connection with a video visit is poor, the visit may have to be switched to a telephone visit. With either a video or telephone visit, we are not always able to ensure that we have a secure connection.  By engaging in this virtual visit, you consent to the provision of healthcare and authorize for your insurance to be billed (if applicable) for the services provided during this visit. Depending on your insurance coverage, you may receive a charge related to this service.  I need to obtain your verbal consent now. Are you willing to proceed with your visit today? Joshua Sandoval has provided verbal consent on 09/02/2023 for a virtual visit (video or telephone). Joshua Dean, NP  Date: 09/02/2023 8:40 AM   Virtual Visit via Video Note   I, Joshua Sandoval, connected with  Joshua Sandoval  ([id number], February 09, 1933) on 09/02/23 at  8:30 AM EDT by a video-enabled telemedicine application and verified that I am speaking with the correct person using two identifiers.  Location: Patient: Virtual Visit Location Patient: Home Provider:  Virtual Visit Location Provider: Home Office   I discussed the limitations of evaluation and management by telemedicine and the availability of in person appointments. The patient expressed understanding and agreed to proceed.    History of Present Illness: Joshua Sandoval is a 89 y.o. who identifies as a male who was assigned male at birth, and is being seen today for post herpetic neuralgia.  Joshua Sandoval has been experiencing severe neuralgia after being diagnosed with shingles 08-08-23. His spouse who is speaking on his behalf states the hydrocodone  has not been very effective.    Problems:  Patient Active Problem List   Diagnosis Date Noted   Herpes zoster 08/10/2023   Insomnia 10/14/2022   Left shoulder pain 04/04/2022   Anterior cerebral artery aneurysm 09/06/2021   Memory changes 07/02/2021   COVID-19 06/30/2021   Dyspnea on exertion 02/27/2020   Finger numbness 05/03/2019   Low back pain 11/22/2018   Frequent PVCs 03/07/2018   Pure hypercholesterolemia 03/07/2018   Rib pain 03/02/2018   Strain of rotator cuff capsule 03/02/2018   Right bundle branch block 12/23/2017   Essential hypertension 12/18/2017   Evaluation of hearing impairment 09/14/2016   Biceps tendinitis 05/22/2015    Allergies:  Allergies  Allergen Reactions   Other     NKDA   Medications:  Current Outpatient Medications:    amLODipine  (NORVASC ) 10 MG tablet, TAKE 1 TABLET(10 MG) BY MOUTH DAILY, Disp: 90 tablet, Rfl: 3   aspirin EC 81 MG tablet, Take 81  mg by mouth as needed. , Disp: , Rfl:    cyanocobalamin  (VITAMIN B12) 1000 MCG tablet, Take 1 tablet (1,000 mcg total) by mouth daily., Disp: 90 tablet, Rfl: 3   donepezil  (ARICEPT ) 5 MG tablet, Take 1 tablet (5 mg total) by mouth at bedtime., Disp: 90 tablet, Rfl: 3   gabapentin  (NEURONTIN ) 100 MG capsule, Take 1 capsule (100 mg total) by mouth 3 (three) times daily for 15 days., Disp: 45 capsule, Rfl: 0   losartan  (COZAAR ) 25 MG tablet, Take 1 tablet (25 mg  total) by mouth daily., Disp: 90 tablet, Rfl: 3   metoprolol  succinate (TOPROL -XL) 50 MG 24 hr tablet, TAKE 1 TABLET(50 MG) BY MOUTH DAILY WITH OR IMMEDIATELY FOLLOWING A MEAL, Disp: 90 tablet, Rfl: 3   rosuvastatin  (CRESTOR ) 5 MG tablet, Take 1 tablet (5 mg total) by mouth daily., Disp: 90 tablet, Rfl: 3  Observations/Objective: Patient is well-developed, well-nourished in no acute distress.  Resting comfortably at home.  Head is normocephalic, atraumatic.  No labored breathing.  Speech is clear and coherent with logical content.  Patient is alert and oriented at baseline.    Assessment and Plan: 1. Post herpetic neuralgia (Primary) - gabapentin  (NEURONTIN ) 100 MG capsule; Take 1 capsule (100 mg total) by mouth 3 (three) times daily for 15 days.  Dispense: 45 capsule; Refill: 0 May also try capsaicin cream over the counter  Follow Up Instructions: I discussed the assessment and treatment plan with the patient. The patient was provided an opportunity to ask questions and all were answered. The patient agreed with the plan and demonstrated an understanding of the instructions.  A copy of instructions were sent to the patient via MyChart unless otherwise noted below.    The patient was advised to call back or seek an in-person evaluation if the symptoms worsen or if the condition fails to improve as anticipated.    Joshua Kowalewski W Kevina Piloto, NP

## 2023-09-04 NOTE — Telephone Encounter (Signed)
 Spoke to pt wife they did VV through [facility] and they prescribed Gabapentin , pt wife stated that he is still in pain but refuses to come into office she is going to see how he feels on the Gabapentin  and she will let us  know in a couple days how he is doing

## 2023-09-05 NOTE — Telephone Encounter (Signed)
 Noted.

## 2023-09-05 NOTE — Telephone Encounter (Signed)
 Pt had virtual visit 09/02/23

## 2023-09-21 DIAGNOSIS — Z961 Presence of intraocular lens: Secondary | ICD-10-CM | POA: Diagnosis not present

## 2023-09-21 DIAGNOSIS — H43813 Vitreous degeneration, bilateral: Secondary | ICD-10-CM | POA: Diagnosis not present

## 2023-09-21 DIAGNOSIS — Z0100 Encounter for examination of eyes and vision without abnormal findings: Secondary | ICD-10-CM | POA: Diagnosis not present

## 2023-09-28 ENCOUNTER — Other Ambulatory Visit: Payer: Self-pay | Admitting: Family

## 2023-09-28 ENCOUNTER — Emergency Department
Admission: EM | Admit: 2023-09-28 | Discharge: 2023-09-28 | Disposition: A | Discharge status: Home / Self Care | Attending: Emergency Medicine | Admitting: Emergency Medicine

## 2023-09-28 ENCOUNTER — Other Ambulatory Visit: Payer: Self-pay

## 2023-09-28 ENCOUNTER — Encounter: Payer: Self-pay | Admitting: Emergency Medicine

## 2023-09-28 DIAGNOSIS — B0229 Other postherpetic nervous system involvement: Secondary | ICD-10-CM

## 2023-09-28 DIAGNOSIS — M79604 Pain in right leg: Secondary | ICD-10-CM | POA: Diagnosis present

## 2023-09-28 NOTE — ED Triage Notes (Signed)
 Patient to ED via POV for right leg pain. Pain ongoing x1 month since getting shingles. Denies injury.

## 2023-09-28 NOTE — ED Provider Notes (Signed)
 [facility] Provider Note    Event Date/Time   First MD Initiated Contact with Patient 09/28/23 0840     (approximate)   History   Leg Pain   HPI  Joshua Sandoval is a 89 y.o. male who presents to the ED for evaluation of Leg Pain   I review ED visit from 3/25, subsequent PCP follow-up in April x3.  Diagnosed with shingles 2 months ago, given Valtrex .  Prescribed gabapentin  on a video visit on 4/19 due to concerns for postherpetic neuralgia.  Patient presents to the ED alongside his wife for evaluation of 2 months of right leg pain.  Reports finishing a course of Valtrex , occasionally taking gabapentin  or half a tablet of Norco but reports poor sleep at night due to the pain.  Improved pain during the daytime.  His wife reports frustration that he does not take his medications, does not go on walks when she offered to take him to the park.   Physical Exam   Triage Vital Signs: ED Triage Vitals  Encounter Vitals Group     BP 09/28/23 0835 (!) 174/97     Systolic BP Percentile --      Diastolic BP Percentile --      Pulse Rate 09/28/23 0835 87     Resp 09/28/23 0835 17     Temp 09/28/23 0835 98.4 F (36.9 C)     Temp Source 09/28/23 0835 Oral     SpO2 09/28/23 0835 100 %     Weight 09/28/23 0834 150 lb (68 kg)     Height 09/28/23 0834 5\' 8"  (1.727 m)     Head Circumference --      Peak Flow --      Pain Score 09/28/23 0833 9     Pain Loc --      Pain Education --      Exclude from Growth Chart --     Most recent vital signs: Vitals:   09/28/23 0835  BP: (!) 174/97  Pulse: 87  Resp: 17  Temp: 98.4 F (36.9 C)  SpO2: 100%    General: Awake, no distress.  CV:  Good peripheral perfusion.  Resp:  Normal effort.  Abd:  No distention.  MSK:  No deformity noted.  No rash, skin changes, redness or signs of trauma Neuro:  No focal deficits appreciated. Other:     ED Results / Procedures / Treatments   Labs (all labs ordered are listed,  but only abnormal results are displayed) Labs Reviewed - No data to display  EKG   RADIOLOGY   Official radiology report(s): No results found.  PROCEDURES and INTERVENTIONS:  Procedures  Medications - No data to display   IMPRESSION / MDM / ASSESSMENT AND PLAN / ED COURSE  I reviewed the triage vital signs and the nursing notes.  Differential diagnosis includes, but is not limited to, postherpetic neuralgia, sciatica, cellulitis or superimposed infection  Patient presents with stigmata of chronic postherpetic neuralgia.  We discussed maximizing nonnarcotic analgesia, exercise and PCP follow-up.  Answered questions.  Suitable for outpatient management.  Normal exam and well-appearing patient      FINAL CLINICAL IMPRESSION(S) / ED DIAGNOSES   Final diagnoses:  Post herpetic neuralgia     Rx / DC Orders   ED Discharge Orders     None        Note:  This document was prepared using Dragon voice recognition software and may include unintentional dictation errors.  Arline Bennett, MD 09/28/23 [phone]

## 2023-09-28 NOTE — Discharge Instructions (Addendum)
 Please take Tylenol  and ibuprofen/Advil for your pain.  It is safe to take them together, or to alternate them every few hours.  Take up to 1000mg  of Tylenol  at a time, up to 4 times per day.  Do not take more than 4000 mg of Tylenol  in 24 hours.    For ibuprofen, take 400, up to 3 times per day.   Do not take other NSAIDs such as aspirin, Aleve, BC/Goody's while taking ibuprofen.  Use the gabapentin , particularly at night.  Up to 3 times per day  Exercise.  Use your legs, go on walks and help tire yourself out and this will help with pain.

## 2023-09-28 NOTE — ED Notes (Signed)
 See triage note Presents with right leg pain  States he was dx'd with shingles about a month ago  States pain is worse  Denies any injury  Ambulates well to room

## 2023-09-29 NOTE — Telephone Encounter (Signed)
 Filled by a different provider 3 weeks ago

## 2023-10-04 ENCOUNTER — Other Ambulatory Visit: Payer: Self-pay | Admitting: Family

## 2023-10-04 DIAGNOSIS — B0229 Other postherpetic nervous system involvement: Secondary | ICD-10-CM

## 2023-10-04 NOTE — Telephone Encounter (Unsigned)
 Copied from CRM [id number]. Topic: Clinical - Prescription Issue >> Oct 04, 2023  2:47 PM Chuck Crater wrote: Reason for CRM: Patient wife is calling to check on Gabapentin  for husband. His dosage is 100 mg and he is completely out.

## 2023-10-04 NOTE — Telephone Encounter (Unsigned)
 Copied from CRM [id number]. Topic: Clinical - Medication Refill >> Oct 04, 2023  2:40 PM Chuck Crater wrote: Medication: losartan  (COZAAR ) 25 MG tablet  Has the patient contacted their pharmacy? Yes (Agent: If no, request that the patient contact the pharmacy for the refill. If patient does not wish to contact the pharmacy document the reason why and proceed with request.) (Agent: If yes, when and what did the pharmacy advise?) advised no response from doctor  This is the patient's preferred pharmacy:  [id number] [place], [place] - [address] AT [facility] OF [address] [place] [address] Phone: [phone] Fax: [phone]  Is this the correct pharmacy for this prescription? Yes If no, delete pharmacy and type the correct one.   Has the prescription been filled recently? No  Is the patient out of the medication? Yes  Has the patient been seen for an appointment in the last year OR does the patient have an upcoming appointment? Yes  Can we respond through MyChart? Yes  Agent: Please be advised that Rx refills may take up to 3 business days. We ask that you follow-up with your pharmacy.

## 2023-10-10 NOTE — Telephone Encounter (Signed)
 Filled by NP during a video visit last month. Wife informed that I would need to send to PCP for additional refill if needed.   Wife reports that he is still having right leg pain from the shingles. And was told this could last short term or even long term.  Please update quantity if approved.  Thanks

## 2023-10-11 MED ORDER — GABAPENTIN 100 MG PO CAPS
100.0000 mg | ORAL_CAPSULE | Freq: Three times a day (TID) | ORAL | 1 refills | Status: DC
Start: 2023-10-11 — End: 2024-02-08

## 2023-10-11 MED ORDER — LOSARTAN POTASSIUM 25 MG PO TABS: 25.0000 mg | ORAL_TABLET | Freq: Every day | ORAL | 3 refills | Status: AC

## 2023-11-27 ENCOUNTER — Ambulatory Visit: Attending: Medical | Admitting: Medical

## 2023-11-27 ENCOUNTER — Encounter: Payer: Self-pay | Admitting: Medical

## 2023-11-27 VITALS — BP 144/84 | HR 70 | Ht 66.0 in | Wt 150.6 lb

## 2023-11-27 DIAGNOSIS — I10 Essential (primary) hypertension: Secondary | ICD-10-CM

## 2023-11-27 DIAGNOSIS — E785 Hyperlipidemia, unspecified: Secondary | ICD-10-CM | POA: Diagnosis not present

## 2023-11-27 DIAGNOSIS — I493 Ventricular premature depolarization: Secondary | ICD-10-CM

## 2023-11-27 NOTE — Progress Notes (Signed)
 Cardiology Office Note   Date:  11/27/2023  ID:  Joshua Sandoval, DOB Apr 04, 1933, MRN [id number] PCP: Dineen Rollene MATSU, FNP  [facility] Providers Cardiologist:  Lonni Hanson, MD   History of Present Illness Joshua Sandoval is a 89 y.o. male with a hx of HTN, frequent PVCs, RBBB, and diastolic dysfunction, memory changes/dementia who presents for follow-up for PVCs.    He has a history of DOE and previously underwent echo in 2021 that showed LVEF 55-60%, trivial MR, G1DD, mild to mod aortic sclerosis without stenosis. Stress testing was advised however, he initially declined. In August 2022 he was agreeable to proceed with stress testing in the setting of of frequent PVCs. Unfortunately he never followed through with stress testing. He was also scheduled for Cardiac CTA and Myoview  lexiscan, but never followed up.    The patient was last seen 11/22/22 and BP was high, but he reported he was eating a lot of salt. His BP medications were refilled.   Today, the patient is overall doing OK. He had shingles recently and is on gabapentin . He had shingles on his leg, and has occasional sharp pain. He is still eating salty foods. He denies chest pain or SOB. He normally works-out at the [facility]. He denies lower leg edema, lightheadedness, dizziness. Since he had shingles he has not had any of his other medications. Dementia is likely playing a role in medications compliance.   Studies Reviewed EKG Interpretation Date/Time:  Monday November 27 2023 13:38:24 EDT Ventricular Rate:  70 PR Interval:  162 QRS Duration:  134 QT Interval:  442 QTC Calculation: 477 R Axis:   -49  Text Interpretation: Sinus rhythm with occasional Premature ventricular complexes Right bundle branch block Left anterior fascicular block Bifascicular block When compared with ECG of 22-Nov-2022 08:15, No significant change was found Confirmed by Franchester, Vonn Sliger ([id number]) on 11/27/2023 1:52:33 PM    Echo 2021 1. Left ventricular ejection  fraction, by estimation, is 55 to 60%. The  left ventricle has normal function. The left ventricle has no regional  wall motion abnormalities. There is mild asymmetric left ventricular  hypertrophy of the basal-septal segment.  Left ventricular diastolic parameters are consistent with Grade I  diastolic dysfunction (impaired relaxation). Elevated left atrial  pressure.   2. Right ventricular systolic function is mildly reduced. The right  ventricular size is normal. Tricuspid regurgitation signal is inadequate  for assessing PA pressure.   3. The mitral valve is normal in structure. Trivial mitral valve  regurgitation. No evidence of mitral stenosis.   4. The aortic valve is tricuspid. There is mild thickening of the aortic  valve. Aortic valve regurgitation is not visualized. Mild to moderate  aortic valve sclerosis/calcification is present, without any evidence of  aortic stenosis.   5. The inferior vena cava is normal in size with greater than 50%  respiratory variability, suggesting right atrial pressure of 3 mmHg.    Physical Exam VS:  BP (!) 144/84 (BP Location: Left Arm, Patient Position: Sitting, Cuff Size: Normal)   Pulse 70   Ht 5' 6 (1.676 m)   Wt 150 lb 9.6 oz (68.3 kg)   SpO2 98%   BMI 24.31 kg/m        Wt Readings from Last 3 Encounters:  11/27/23 150 lb 9.6 oz (68.3 kg)  09/28/23 150 lb (68 kg)  08/17/23 153 lb 9.6 oz (69.7 kg)    GEN: Well nourished, well developed in no acute distress NECK: No JVD; No  carotid bruits CARDIAC: RRR, no murmurs, rubs, gallops RESPIRATORY:  Clear to auscultation without rales, wheezing or rhonchi  ABDOMEN: Soft, non-tender, non-distended EXTREMITIES:  No edema; No deformity   ASSESSMENT AND PLAN  HTN BP is high, but he reports he stopped all his medications when he was given the medication (gabapentin ) for shingles in April 2025. I recommended he restart amlodipine  10mg  daily, losartan  25mg  daily and Toprol  50mg  daily. Patient  is still eating salty food, low salt diet was recommended. I recommended he check his BP at home. Granddaughter is here today, who would like a video visit in 2 weeks to discuss medications.  PVCs EKG with NSR and PVCs. Restart Toprol  as above.   HLD LDL 178 last year. Restart Crestor  5mg  daily. Needs updated lipid panel this year.         Dispo: 2 week video visit, in-person follow-up in 6 months  Signed, Taivon Haroon VEAR Fishman, PA-C

## 2023-11-27 NOTE — Patient Instructions (Signed)
 Medication Instructions:  Your physician recommends that you continue on your current medications as directed. Please refer to the Current Medication list given to you today.    *If you need a refill on your cardiac medications before your next appointment, please call your pharmacy*  Lab Work: No labs ordered today    Testing/Procedures: No test ordered today   Follow-Up: At [facility], you and your health needs are our priority.  As part of our continuing mission to provide you with exceptional heart care, our providers are all part of one team.  This team includes your primary Cardiologist (physician) and Advanced Practice Providers or APPs (Physician Assistants and Nurse Practitioners) who all work together to provide you with the care you need, when you need it.  Your next appointment:   2 week(s) video visit  Provider:   Cadence Franchester RIGGERS    Your physician recommends that you schedule a follow-up appointment in 6 months

## 2023-11-28 ENCOUNTER — Ambulatory Visit: Admitting: Medical

## 2023-12-21 ENCOUNTER — Other Ambulatory Visit: Payer: Self-pay

## 2023-12-21 ENCOUNTER — Other Ambulatory Visit (HOSPITAL_COMMUNITY): Payer: Self-pay

## 2023-12-21 MED ORDER — METOPROLOL SUCCINATE ER 50 MG PO TB24
50.0000 mg | ORAL_TABLET | Freq: Every day | ORAL | 1 refills | Status: DC
  Filled 2023-12-21: qty 90, 90d supply, fill #0

## 2023-12-25 ENCOUNTER — Other Ambulatory Visit (HOSPITAL_COMMUNITY): Payer: Self-pay

## 2023-12-25 ENCOUNTER — Other Ambulatory Visit: Payer: Self-pay

## 2023-12-25 MED ORDER — METOPROLOL SUCCINATE ER 50 MG PO TB24: 50.0000 mg | ORAL_TABLET | Freq: Every day | ORAL | 3 refills | Status: AC

## 2024-01-01 ENCOUNTER — Other Ambulatory Visit (HOSPITAL_COMMUNITY): Payer: Self-pay

## 2024-02-02 ENCOUNTER — Other Ambulatory Visit: Payer: Self-pay

## 2024-02-02 MED ORDER — AMLODIPINE BESYLATE 10 MG PO TABS: ORAL_TABLET | ORAL | 3 refills | Status: DC

## 2024-02-08 ENCOUNTER — Encounter: Payer: Self-pay | Admitting: Family

## 2024-02-08 ENCOUNTER — Ambulatory Visit: Admitting: Family

## 2024-02-08 VITALS — BP 142/80 | HR 60 | Temp 97.3°F | Ht 66.0 in | Wt 149.6 lb

## 2024-02-08 DIAGNOSIS — E7800 Pure hypercholesterolemia, unspecified: Secondary | ICD-10-CM

## 2024-02-08 DIAGNOSIS — R972 Elevated prostate specific antigen [PSA]: Secondary | ICD-10-CM | POA: Insufficient documentation

## 2024-02-08 DIAGNOSIS — R7301 Impaired fasting glucose: Secondary | ICD-10-CM | POA: Diagnosis not present

## 2024-02-08 DIAGNOSIS — B0229 Other postherpetic nervous system involvement: Secondary | ICD-10-CM | POA: Diagnosis not present

## 2024-02-08 DIAGNOSIS — R5383 Other fatigue: Secondary | ICD-10-CM

## 2024-02-08 DIAGNOSIS — R413 Other amnesia: Secondary | ICD-10-CM

## 2024-02-08 DIAGNOSIS — I10 Essential (primary) hypertension: Secondary | ICD-10-CM | POA: Diagnosis not present

## 2024-02-08 DIAGNOSIS — R351 Nocturia: Secondary | ICD-10-CM | POA: Diagnosis not present

## 2024-02-08 LAB — TSH: TSH: 2.16 u[IU]/mL (ref 0.35–5.50)

## 2024-02-08 LAB — URINALYSIS, ROUTINE W REFLEX MICROSCOPIC
Bilirubin Urine: NEGATIVE
Hgb urine dipstick: NEGATIVE
Ketones, ur: NEGATIVE
Leukocytes,Ua: NEGATIVE
Nitrite: NEGATIVE
RBC / HPF: NONE SEEN (ref 0–?)
Specific Gravity, Urine: 1.025 (ref 1.000–1.030)
Urine Glucose: NEGATIVE
Urobilinogen, UA: 1 (ref 0.0–1.0)
pH: 6 (ref 5.0–8.0)

## 2024-02-08 LAB — CBC WITH DIFFERENTIAL/PLATELET
Basophils Absolute: 0 K/uL (ref 0.0–0.1)
Basophils Relative: 0.5 % (ref 0.0–3.0)
Eosinophils Absolute: 0.2 K/uL (ref 0.0–0.7)
Eosinophils Relative: 2.6 % (ref 0.0–5.0)
HCT: 41 % (ref 39.0–52.0)
Hemoglobin: 13.7 g/dL (ref 13.0–17.0)
Lymphocytes Relative: 31.3 % (ref 12.0–46.0)
Lymphs Abs: 1.9 K/uL (ref 0.7–4.0)
MCHC: 33.4 g/dL (ref 30.0–36.0)
MCV: 85 fl (ref 78.0–100.0)
Monocytes Absolute: 0.5 K/uL (ref 0.1–1.0)
Monocytes Relative: 8.1 % (ref 3.0–12.0)
Neutro Abs: 3.5 K/uL (ref 1.4–7.7)
Neutrophils Relative %: 57.5 % (ref 43.0–77.0)
Platelets: 154 K/uL (ref 150.0–400.0)
RBC: 4.82 Mil/uL (ref 4.22–5.81)
RDW: 13.4 % (ref 11.5–15.5)
WBC: 6 K/uL (ref 4.0–10.5)

## 2024-02-08 LAB — COMPREHENSIVE METABOLIC PANEL WITH GFR
ALT: 8 U/L (ref 0–53)
AST: 16 U/L (ref 0–37)
Albumin: 4.4 g/dL (ref 3.5–5.2)
Alkaline Phosphatase: 43 U/L (ref 39–117)
BUN: 19 mg/dL (ref 6–23)
CO2: 30 meq/L (ref 19–32)
Calcium: 10 mg/dL (ref 8.4–10.5)
Chloride: 100 meq/L (ref 96–112)
Creatinine, Ser: 0.88 mg/dL (ref 0.40–1.50)
GFR: 76.35 mL/min (ref >60.00)
Glucose, Bld: 104 mg/dL — ABNORMAL HIGH (ref 70–99)
Potassium: 4.6 meq/L (ref 3.5–5.1)
Sodium: 139 meq/L (ref 135–145)
Total Bilirubin: 0.7 mg/dL (ref 0.2–1.2)
Total Protein: 7.2 g/dL (ref 6.0–8.3)

## 2024-02-08 LAB — PSA: PSA: 179.18 ng/mL — ABNORMAL HIGH (ref <=4.00)

## 2024-02-08 LAB — MICROALBUMIN / CREATININE URINE RATIO
Creatinine,U: 163.4 mg/dL
Microalb Creat Ratio: 35.6 mg/g — ABNORMAL HIGH (ref 0.0–30.0)
Microalb, Ur: 5.8 mg/dL — ABNORMAL HIGH (ref 0.0–1.9)

## 2024-02-08 LAB — HEMOGLOBIN A1C: Hgb A1c MFr Bld: 6.7 % — ABNORMAL HIGH (ref 4.6–6.5)

## 2024-02-08 MED ORDER — GABAPENTIN 100 MG PO CAPS
100.0000 mg | ORAL_CAPSULE | Freq: Three times a day (TID) | ORAL | 1 refills | Status: DC
Start: 2024-02-08 — End: 2024-02-20

## 2024-02-08 MED ORDER — DONEPEZIL HCL 10 MG PO TABS: 10.0000 mg | ORAL_TABLET | Freq: Every day | ORAL | 3 refills | Status: AC

## 2024-02-08 MED ORDER — ROSUVASTATIN CALCIUM 5 MG PO TABS
5.0000 mg | ORAL_TABLET | Freq: Every day | ORAL | 3 refills | Status: AC
Start: 2024-02-08 — End: ?

## 2024-02-08 NOTE — Progress Notes (Unsigned)
 Assessment & Plan:  Pure hypercholesterolemia  Essential hypertension  Impaired fasting glucose  Other fatigue  Memory changes     Return precautions given.   Risks, benefits, and alternatives of the medications and treatment plan prescribed today were discussed, and patient expressed understanding.   Education regarding symptom management and diagnosis given to patient on AVS either electronically or printed.  No follow-ups on file.  Rollene Northern, FNP  Subjective:    Patient ID: Joshua Sandoval, male    DOB: 10-Sep-1932, 89 y.o.   MRN: [id number]  CC: Joshua Sandoval is a 89 y.o. male who presents today for follow up.   HPI: Accompanied by family friend as wife unable to come  He reports that wife is not cooking as much.  Denies abdominal pain, N, vomiting.   Endorses urinary frequency after ' I drink a lot of water' .  Denies decreased urinary stream, trouble urinating, hematuria No family h/o prostate cancer.   He continues to drive but not far. He has retired.   No side effects donepezil  5mg . Previously nausea , cloudy on medication 08/2023.       Cardiology follow-up 11/27/2023 for HTN. recommended he restart amlodipine  10mg  daily, losartan  25mg  daily and Toprol  50mg  daily.  Once his blood I do not see Mr. Allergies: Other Current Outpatient Medications on File Prior to Visit  Medication Sig Dispense Refill   amLODipine  (NORVASC ) 10 MG tablet TAKE 1 TABLET(10 MG) BY MOUTH DAILY 90 tablet 3   aspirin EC 81 MG tablet Take 81 mg by mouth as needed.      cyanocobalamin  (VITAMIN B12) 1000 MCG tablet Take 1 tablet (1,000 mcg total) by mouth daily. 90 tablet 3   donepezil  (ARICEPT ) 5 MG tablet Take 1 tablet (5 mg total) by mouth at bedtime. 90 tablet 3   gabapentin  (NEURONTIN ) 100 MG capsule Take 1 capsule (100 mg total) by mouth 3 (three) times daily. 90 capsule 1   losartan  (COZAAR ) 25 MG tablet Take 1 tablet (25 mg total) by mouth daily. 90 tablet 3   metoprolol  succinate  (TOPROL -XL) 50 MG 24 hr tablet Take 1 tablet (50 mg total) by mouth daily with or immediately following a meal.. 90 tablet 3   rosuvastatin  (CRESTOR ) 5 MG tablet Take 1 tablet (5 mg total) by mouth daily. 90 tablet 3   No current facility-administered medications on file prior to visit.    Review of Systems  Constitutional:  Negative for chills and fever.  Respiratory:  Negative for cough.   Cardiovascular:  Negative for chest pain and palpitations.  Gastrointestinal:  Negative for abdominal pain, nausea and vomiting.  Genitourinary:  Positive for frequency. Negative for decreased urine volume and difficulty urinating.      Objective:    BP (!) 142/80   Pulse 60   Temp (!) 97.3 F (36.3 C) (Oral)   Ht 5' 6 (1.676 m)   Wt 149 lb 9.6 oz (67.9 kg)   SpO2 98%   BMI 24.15 kg/m  BP Readings from Last 3 Encounters:  02/08/24 (!) 142/80  11/27/23 (!) 144/84  09/28/23 (!) 174/97   Wt Readings from Last 3 Encounters:  02/08/24 149 lb 9.6 oz (67.9 kg)  11/27/23 150 lb 9.6 oz (68.3 kg)  09/28/23 150 lb (68 kg)    Physical Exam Vitals reviewed.  Constitutional:      Appearance: Normal appearance. He is well-developed.  Cardiovascular:     Rate and Rhythm: Regular rhythm.  Heart sounds: Normal heart sounds.  Pulmonary:     Effort: Pulmonary effort is normal. No respiratory distress.     Breath sounds: Normal breath sounds. No wheezing or rales.  Abdominal:     General: Bowel sounds are normal. There is no distension.     Palpations: Abdomen is soft. Abdomen is not rigid. There is no fluid wave or mass.     Tenderness: There is no abdominal tenderness. There is no guarding or rebound. Negative signs include Murphy's sign and McBurney's sign.  Skin:    General: Skin is warm and dry.  Neurological:     Mental Status: He is alert.  Psychiatric:        Speech: Speech normal.        Behavior: Behavior normal.

## 2024-02-08 NOTE — Patient Instructions (Addendum)
 There is a year supply of losartan  25mg  at your pharmacy.   Please check to see if refills are automated as you have a year supply of most medications on your chart ( I have refilled gabapentin  and crestor  today for one year as well) and ask who they are calling when prescriptions are ready.   In regards to cooking, I would recommend looking into [employer], [employer] meals which are convenient. Sandwiches are great, filling way to satisfy hunger. Your weight fortunately is stable today.   Increase donepezil  from 5mg  to 10mg  for memory changes/dementia. Take with food; let me know of side effects.   I have ordered PSA ( prostate ) lab and urine studies to evaluate for prostate etiology.   Nice to see you.

## 2024-02-09 ENCOUNTER — Ambulatory Visit: Payer: Self-pay | Admitting: Family

## 2024-02-09 DIAGNOSIS — R972 Elevated prostate specific antigen [PSA]: Secondary | ICD-10-CM

## 2024-02-09 NOTE — Assessment & Plan Note (Signed)
 Discussed physiologic and pathologic nocturia.  Consider OSA evaluation.  Pending PSA, urinalysis.

## 2024-02-09 NOTE — Telephone Encounter (Signed)
 Patient's wife called back. Has patient scheduled for 02/29/2024 @ 2:30 for his onset Diabetes

## 2024-02-09 NOTE — Assessment & Plan Note (Signed)
Chronic, stable.  Continue losartan 25 mg daily, metoprolol succinate 50 mg daily, amlodipine 10 mg QD 

## 2024-02-09 NOTE — Assessment & Plan Note (Signed)
 Chronic, subtle progression.  Again I have extensively counseled him and friend who accompanied him today in regards to risk of driving.  Discussed risk to himself and risks to others.  Previously discussed this with family as well.  Will continue to reiterate that he no longer needs to operate a vehicle.  Increase donepezil  from 5 mg to 10 mg.  Monitor for side effects.

## 2024-02-13 ENCOUNTER — Ambulatory Visit: Admitting: Urology

## 2024-02-13 ENCOUNTER — Encounter: Payer: Self-pay | Admitting: Urology

## 2024-02-13 VITALS — BP 165/75 | HR 60 | Wt 145.0 lb

## 2024-02-13 DIAGNOSIS — R972 Elevated prostate specific antigen [PSA]: Secondary | ICD-10-CM | POA: Diagnosis not present

## 2024-02-13 LAB — BLADDER SCAN AMB NON-IMAGING

## 2024-02-13 NOTE — Progress Notes (Signed)
   02/13/24 8:35 AM   Joshua Sandoval 1932-07-01 [id number]  CC: Elevated PSA, urinary symptoms  HPI: Extremely healthy 89 year old male here today with his daughter for an elevated PSA of 179.  There are no prior PSA values to review.  He denies any urinary symptoms, gross hematuria, or pain.  No cross-sectional imaging to review.  Urinalysis benign, PVR normal 38ml.   PMH: Past Medical History:  Diagnosis Date   Diastolic dysfunction    a. 12/2017 Echo: EF 50-55%, no rwma, Gr1 DD, nl RV fxn; b. 03/2020 Echo: EF 55-60%, no rwma, Gr1 DD, Mildly red RV fxn, triv MR, mild to mod AoV sclerosis w/o stenosis.   Elevated PSA    History of stress test    a. 01/2018 MV: EF 55%. No ischemia/infarct-->Low risk.   Hypertension    PVC's (premature ventricular contractions)    a. 02/2018 Holter: RSR, >7k PVCs (15% burden). 641 runs of NSVT up to 6 beats.   RBBB     Surgical History: Past Surgical History:  Procedure Laterality Date   CATARACT EXTRACTION W/PHACO Right 12/27/2022   Procedure: CATARACT EXTRACTION PHACO AND INTRAOCULAR LENS PLACEMENT (IOC) RIGHT 8.98 01:03.7;  Surgeon: Jaye Fallow, MD;  Location: [facility];  Service: Ophthalmology;  Laterality: Right;   CATARACT EXTRACTION W/PHACO Left 01/24/2023   Procedure: CATARACT EXTRACTION PHACO AND INTRAOCULAR LENS PLACEMENT (IOC) LEFT 10.65 00:54.0;  Surgeon: Jaye Fallow, MD;  Location: [facility];  Service: Ophthalmology;  Laterality: Left;   NO PAST SURGERIES      Family History: Family History  Problem Relation Age of Onset   Hypertension Mother    Stroke Mother     Social History:  reports that he quit smoking about 45 years ago. His smoking use included cigarettes. He has never used smokeless tobacco. He reports that he does not currently use alcohol. He reports that he does not use drugs.  Physical Exam: BP (!) 165/75 (BP Location: Left Arm, Patient Position: Sitting, Cuff Size: Normal)   Pulse 60   Wt  145 lb (65.8 kg)   SpO2 100%   BMI 23.40 kg/m    Constitutional:  Alert and oriented, No acute distress. Cardiovascular: No clubbing, cyanosis, or edema. Respiratory: Normal respiratory effort, no increased work of breathing. GI: Abdomen is soft, nontender, nondistended, no abdominal masses   Laboratory Data: See HPI  Assessment & Plan:   89 year old extremely healthy male with significantly elevated PSA of 179, no prior values to review.  I had a very frank discussion with the patient and his daughter today about high suspicion for prostate cancer, most likely metastatic based on the significant PSA elevation.  We reviewed options including prostate biopsy, PSMA PET scan, or watchful waiting.  Using shared decision making he opted for PSMA PET scan.  Risks and benefits were discussed.  PSMA PET scan, call with results, if metastatic disease will refer to oncology and consider biopsy of metastatic sites or prostate if needed   Redell Burnet, MD 02/13/2024  [facility] [address] [place], [place] [address] [phone]

## 2024-02-13 NOTE — Patient Instructions (Addendum)
 Your PSA level was extremely high at 179 which is concerning for prostate cancer, potentially outside of the prostate.  We will order a PET scan which will look for any cancer cells, and likely get you set up with oncology for potential treatment options.  Please call [phone] or [phone] to schedule your PET scan.     Understanding PSA Screening  What is PSA Screening? PSA stands for Prostate-Specific Antigen. It is a protein made by the prostate gland.  PSA is made by normal prostate tissue, but can be elevated in patients with prostate cancer.  There are other factors that can cause an increased PSA besides prostate cancer including an enlarged prostate(BPH), recent ejaculation, infection(prostatitis), inflammation, recent illness or procedure.  A normal PSA level is less than 4 for men under age 47.  Why is PSA Screening Important? Prostate cancer is a common cancer in men, by finding prostate cancer early before patients have symptoms we can often cure this with either surgery or radiation.  Some prostate cancers grow very slowly and do not need any intervention and can be safely monitored(active surveillance).  Our goal is to find those more aggressive prostate cancers that if untreated would spread outside the prostate and cause symptoms or death.  If prostate cancer spreads outside the prostate, it cannot usually be cured, but multiple treatments are available to slow the growth of cancer and prolong life.  Who Should Get Tested? Most patients should start PSA testing around age 66 or 32, however high risk patients with a family history of prostate cancer, African American descent, patients with a strong family history of breast cancer should consider screening earlier starting at age 37. After age 28, the risks of screening start outweigh the benefits, and routine screening is not recommended after age 19.  This is because even if you develop prostate cancer in your 93s, 31s, or 90s  it tends to grow so slowly that it would not cause symptoms or problems.  What Are the Benefits?    Early detection of prostate cancer More treatment options if cancer is found  Options if you have an elevated PSA: First, a confirmatory second PSA level will always be checked to confirm elevation, as false elevations are common and we want to avoid any invasive testing if possible If you have 2 elevated PSA levels, options include:  PHI(prostate health index) score: (fancy PSA blood test), this can help determine your risk of prostate cancer if your PSA is mildly elevated between 4 and 10 before moving to more expensive/invasive testing Prostate MRI: Imaging test that can detect areas suspicious for prostate cancer, if MRI is completely normal can potentially avoid a prostate biopsy Prostate biopsy: 10 to 15-minute procedure performed in clinic, can be uncomfortable, 1 to 2% chance of serious bleeding or infection, best test to determine if prostate cancer is present but more invasive   What Are the Risks or Downsides?    Prostate biopsy can be uncomfortable with a small risk of bleeding or infection Some prostate cancers grow very slowly and might never cause problems, and detection may lead to unnecessary treatments Possible side effects from follow-up tests or treatments

## 2024-02-20 ENCOUNTER — Ambulatory Visit: Admitting: Family

## 2024-02-20 ENCOUNTER — Telehealth: Payer: Self-pay

## 2024-02-20 ENCOUNTER — Encounter: Payer: Self-pay | Admitting: Family

## 2024-02-20 VITALS — BP 148/88 | HR 56 | Temp 97.3°F | Wt 149.8 lb

## 2024-02-20 DIAGNOSIS — R972 Elevated prostate specific antigen [PSA]: Secondary | ICD-10-CM | POA: Diagnosis not present

## 2024-02-20 DIAGNOSIS — E7800 Pure hypercholesterolemia, unspecified: Secondary | ICD-10-CM

## 2024-02-20 DIAGNOSIS — G6289 Other specified polyneuropathies: Secondary | ICD-10-CM | POA: Diagnosis not present

## 2024-02-20 DIAGNOSIS — R413 Other amnesia: Secondary | ICD-10-CM | POA: Diagnosis not present

## 2024-02-20 DIAGNOSIS — K625 Hemorrhage of anus and rectum: Secondary | ICD-10-CM | POA: Diagnosis not present

## 2024-02-20 DIAGNOSIS — E119 Type 2 diabetes mellitus without complications: Secondary | ICD-10-CM

## 2024-02-20 DIAGNOSIS — B0229 Other postherpetic nervous system involvement: Secondary | ICD-10-CM

## 2024-02-20 DIAGNOSIS — I10 Essential (primary) hypertension: Secondary | ICD-10-CM

## 2024-02-20 LAB — B12 AND FOLATE PANEL
Folate: 13.7 ng/mL (ref >5.9)
Vitamin B-12: 176 pg/mL — ABNORMAL LOW (ref 211–911)

## 2024-02-20 MED ORDER — GABAPENTIN 100 MG PO CAPS: 100.0000 mg | ORAL_CAPSULE | Freq: Every evening | ORAL | 1 refills | Status: DC | PRN

## 2024-02-20 NOTE — Assessment & Plan Note (Signed)
 Elevated.  He hasn't taken blood pressure medications yet today.

## 2024-02-20 NOTE — Progress Notes (Unsigned)
 Assessment & Plan:  There are no diagnoses linked to this encounter.   Return precautions given.   Risks, benefits, and alternatives of the medications and treatment plan prescribed today were discussed, and patient expressed understanding.   Education regarding symptom management and diagnosis given to patient on AVS either electronically or printed.  No follow-ups on file.  Rollene Northern, FNP  Subjective:    Patient ID: Joshua Sandoval, male    DOB: [birth year], 89 y.o.   MRN: [id number]  CC: Joshua Sandoval is a 89 y.o. male who presents today for follow up.   HPI: HPI Discussed the use of AI scribe software for clinical note transcription with the patient, who gave verbal consent to proceed.  History of Present Illness   Joshua Sandoval is an 89 year old male who presents with elevated PSA levels and urinary symptoms. He is accompanied by his two daughters.  He has elevated PSA levels and increased urinary frequency, which led to initial urinalysis and PSA testing. The urinalysis showed no signs of infection. He is awaiting a PET scan, which has been delayed due to insurance issues.  He experiences intermittent numbness in his fingertips, mostly at night, without associated neck pain. He has a history of diabetes and was previously on gabapentin  100 mg three times a day for post-shingles pain but has not been taking it recently.  He has a history of diabetes with a recent hemoglobin A1c of 6.7. He is not currently on medication for diabetes and is considering dietary changes to manage his blood sugar levels. He is also on Crestor  for cholesterol management but has not been taking it consistently.  He noticed bright red blood on his underwear about two weeks ago, with no constipation or straining during bowel movements. He has no history of hemorrhoids and normal hemoglobin levels, indicating no anemia.  He is on multiple medications, including losartan , amlodipine , and Crestor , but has not taken  his blood pressure medications today.      Assessment and Plan    Suspected prostate cancer   Exorbitantly high PSA level suggests prostate cancer. Awaiting PET scan for further evaluation. Discussed quality of life and treatment options with family. Follow up with urology for PET scan. Continue discussing treatment options and quality of life with family.  Rectal bleeding, likely lower GI source   Bright red blood likely from a lower GI source such as internal hemorrhoids. Normal hemoglobin levels indicate no significant bleed. Monitor for further rectal bleeding. Consider GI referral for colonoscopy if bleeding persists.  Type 2 diabetes mellitus with peripheral neuropathy   Hemoglobin A1c is 6.7. Peripheral neuropathy symptoms likely related to diabetes. Discussed dietary modifications and gabapentin  use. Refer to nutritionist for dietary management and provide dietary information for low-carb options. Restart gabapentin  at bedtime as needed for neuropathy. Check B12 levels to rule out deficiency.  Hyperlipidemia   LDL is 178, high especially with diabetes. Discussed cholesterol management to reduce cardiovascular risk. Restart Crestor  and recheck lipid panel after a couple of months on Crestor .  Hypertension   Blood pressure is 148/88. Discussed importance of medication adherence. Ensure adherence to current blood pressure medications and monitor blood pressure at home.        Allergies: Other Current Outpatient Medications on File Prior to Visit  Medication Sig Dispense Refill   amLODipine  (NORVASC ) 10 MG tablet TAKE 1 TABLET(10 MG) BY MOUTH DAILY 90 tablet 3   aspirin EC 81 MG tablet Take 81 mg by mouth as needed.  cyanocobalamin  (VITAMIN B12) 1000 MCG tablet Take 1 tablet (1,000 mcg total) by mouth daily. 90 tablet 3   donepezil  (ARICEPT ) 10 MG tablet Take 1 tablet (10 mg total) by mouth at bedtime. 90 tablet 3   gabapentin  (NEURONTIN ) 100 MG capsule Take 1 capsule (100 mg  total) by mouth 3 (three) times daily. 90 capsule 1   losartan  (COZAAR ) 25 MG tablet Take 1 tablet (25 mg total) by mouth daily. 90 tablet 3   metoprolol  succinate (TOPROL -XL) 50 MG 24 hr tablet Take 1 tablet (50 mg total) by mouth daily with or immediately following a meal.. 90 tablet 3   rosuvastatin  (CRESTOR ) 5 MG tablet Take 1 tablet (5 mg total) by mouth daily. 90 tablet 3   No current facility-administered medications on file prior to visit.    Review of Systems    Objective:    BP (!) 148/88   Pulse (!) 56   Temp (!) 97.3 F (36.3 C) (Oral)   Wt 149 lb 12.8 oz (67.9 kg)   SpO2 98%   BMI 24.18 kg/m  BP Readings from Last 3 Encounters:  02/20/24 (!) 148/88  02/13/24 (!) 165/75  02/08/24 (!) 142/80   Wt Readings from Last 3 Encounters:  02/20/24 149 lb 12.8 oz (67.9 kg)  02/13/24 145 lb (65.8 kg)  02/08/24 149 lb 9.6 oz (67.9 kg)    Physical Exam

## 2024-02-20 NOTE — Telephone Encounter (Signed)
 Received secure chat messaeg via Epic from Arland Decent, RT stating: Dr Francisca, I was told by [facility] [employer] has denied the [place] PET PSMA, because no biopsy result , said they are waiting to see if you want to do a P2P. thanks have a good day.  Dr. Francisca is willing to do Peer to peer. Called [employer] at [phone] using tracking # [id number] and Auth # [id number]. Representative states that a peer to peer is not an option as the authorization has been denied. The only 2 options are to Resubmit the authorization which can take up to 45 business days or to submit a reconsideration directly to [facility] at [phone].

## 2024-02-20 NOTE — Patient Instructions (Signed)
 Let me know if you see any more blood from rectum  Gabapentin  100mg  at bedtime if needed for neuropathy  Referral to nutrition Let us  know if you dont hear back within 2 weeks in regards to an appointment being scheduled.  This is  Dr. Lula  example of a  Low GI  Diet:  It will allow you to lose 4 to 8  lbs  per month if you follow it carefully.  Your goal with exercise is a minimum of 30 minutes of aerobic exercise 5 days per week (Walking does not count once it becomes easy!)    All of the foods can be found at grocery stores and in bulk at [employer].  The Atkins protein bars and shakes are available in more varieties at [employer], [employer] and [employer].     7 AM Breakfast:  Choose from the following:  Low carbohydrate Protein  Shakes (I recommend the  Premier Protein chocolate shakes,  EAS AdvantEdge Carb Control shakes  Or the Atkins shakes all are under 3 net carbs)     a scrambled egg/bacon/cheese burrito made with Mission's carb balance whole wheat tortilla  (about 10 net carbs )  Medical laboratory scientific officer (basically a quiche without the pastry crust) that is eaten cold and very convenient way to get your eggs.  8 carbs)  If you make your own protein shakes, avoid bananas and pineapple,  And use low carb greek yogurt or original /unsweetened almond or soy milk    Avoid cereal and bananas, oatmeal and cream of wheat and grits. They are loaded with carbohydrates!   10 AM: high protein snack:  Protein bar by Atkins (the snack size, under 200 cal, usually < 6 net carbs).    A stick of cheese:  Around 1 carb,  100 cal     Dannon Light n Fit [place] Yogurt  (80 cal, 8 carbs)  Other so called protein bars and Greek yogurts tend to be loaded with carbohydrates.  Remember, in food advertising, the word energy is synonymous for  carbohydrate.  Lunch:   A Sandwich using the bread choices listed, Can use any  Eggs,  lunchmeat, grilled meat or canned tuna), avocado,  regular mayo/mustard  and cheese.  A Salad using blue cheese, ranch,  Goddess or vinagrette,  Avoid taco shells, croutons or confetti and no candied nuts but regular nuts OK.   No pretzels, nabs  or chips.  Pickles and miniature sweet peppers are a good low carb alternative that provide a crunch  The bread is the only source of carbohydrate in a sandwich and  can be decreased by trying some of the attached alternatives to traditional loaf bread   Avoid Low fat dressings, as well as [place] and [employer] dressings They are loaded with sugar!   3 PM/ Mid day  Snack:  Consider  1 ounce of  almonds, walnuts, pistachios, pecans, peanuts,  Macadamia nuts or a nut medley.  Avoid granola and granola bars   Mixed nuts are ok in moderation as long as there are no raisins,  cranberries or dried fruit.   KIND bars are OK if you get the low glycemic index variety   Try the prosciutto/mozzarella cheese sticks by F[employer]ci  In deli /backery section   High protein      6 PM  Dinner:     Meat/fowl/fish with a green salad, and either broccoli, cauliflower, green beans, spinach, brussel sprouts or  Lima beans. DO NOT BREAD THE PROTEIN!!      There is a low carb pasta by Dreamfield's that is acceptable and tastes great: only 5 digestible carbs/serving.( All grocery stores but BJs carry it ) Several ready made meals are available low carb:   Try Michel Angelo's chicken piccata or chicken or eggplant parm over low carb pasta.(Lowes and BJs)   [place] Carnitas (pulled pork, no sauce,  0 carbs) or his beef pot roast to make a dinner burrito (at [employer])  Pesto over low carb pasta (bj's sells a good quality pesto in the center refrigerated section of the deli   Try satueeing  Glenna Blonder with mushroooms as a good side   [employer] makes a mashed cauliflower that tastes like mashed potatoes  Whole wheat pasta is still full of digestible carbs and  Not as low in glycemic index as Dreamfield's.    Brown rice is still rice,  So skip the rice and noodles if you eat [place] or [place] (or at least limit to 1/2 cup)  9 PM snack :   Breyer's low carb fudgsicle or  ice cream bar (Carb Smart line), or  Weight Watcher's ice cream bar , or another no sugar added ice cream;  a serving of fresh berries/cherries with whipped cream   Cheese or DANNON'S LlGHT N FIT GREEK YOGURT  8 ounces of [place] unsweetened almond/cococunut milk    Treat yourself to a parfait made with whipped cream blueberiies, walnuts and vanilla greek yogurt  Avoid bananas, pineapple, grapes  and watermelon on a regular basis because they are high in sugar.  THINK OF THEM AS DESSERT  Remember that snack Substitutions should be less than 10 NET carbs per serving and meals < 20 carbs. Remember to subtract fiber grams to get the net carbs.  Diabetes: Carbohydrate Counting for Adults Carbohydrate counting is a method of keeping track of how many carbohydrates you eat. Eating carbohydrates increases the amount of sugar, also called glucose, in your blood. By counting how many carbohydrates you eat, you can improve how well you manage your blood sugar. This, in turn, helps you manage your diabetes. Carbohydrates are measured in grams (g) per serving. It's important to know how many carbohydrates (in grams or by serving size) you can have in each meal. This is different for every person. A dietitian can help you make a meal plan and calculate how many carbohydrates you should have at each meal and snack. What foods contain carbohydrates? Carbohydrates are found in these foods: Grains, such as breads and cereals. Dried beans and soy products. Starchy vegetables, such as potatoes, peas, and corn. Fruit and fruit juices. Milk and yogurt. Sweets and snack foods like cake, cookies, candy, chips, and soft drinks. How do I count carbohydrates in foods? There are two ways to count carbohydrates in food. You can read food labels or  learn standard serving sizes of foods. You can use either of these methods or a combination of both. Using the Nutrition Facts label The Nutrition Facts list is included on the labels of almost all packaged foods and drinks in the [place] It includes: The serving size. Information about nutrients in each serving. This includes the grams of carbohydrate per serving. To use the Nutrition Facts, decide how many servings you will have. Then, multiply the number of servings by the number of carbohydrates per serving. The resulting number is the total grams of carbohydrates that you'll be having. Learning the  standard serving sizes of foods When you eat carbohydrate foods that aren't packaged or don't include Nutrition Facts on the label, you need to measure the servings in order to count the grams of carbohydrates. Measure the foods that you'll eat with a food scale or measuring cup, if needed. Decide how many standard-size servings you'll eat. Multiply the number of servings by 15. For foods that contain carbohydrates, one serving equals 15 g of carbohydrates. For example, if you eat 2 cups or 10 oz (300 g) of strawberries, you'll have eaten 2 servings and 30 g of carbohydrates (2 servings x 15 g = 30 g). For foods that have more than one food mixed, such as soups and casseroles, you must count the carbohydrates in each food that's included. Here's a list of standard serving sizes for common carbohydrate-rich foods. Each of these servings has about 15 g of carbohydrates: 1 slice of bread. 1 six-inch (15 cm) tortilla. ? cup or 2 oz (53 g) of cooked rice or pasta.  cup or 3 oz (85 g) of cooked or canned, drained, and rinsed beans or lentils.  cup or 3 oz (85 g) of a starchy vegetable, such as peas, corn, or squash.  cup or 4 oz (120 g) of hot cereal.  cup or 3 oz (85 g) of boiled or mashed potatoes, or  or 3 oz (85 g) of a large baked potato.  cup or 4 fl oz (118 mL) of fruit juice. 1 cup or 8 fl  oz (237 mL) of milk. 1 small or 4 oz (106 g) apple.  or 2 oz (63 g) of a medium banana. 1 cup or 5 oz (150 g) of strawberries. 3 cups or 1 oz (28.3 g) of popped popcorn. What is an example of carbohydrate counting? To calculate the grams of carbohydrates in this sample meal, follow the steps below. Sample meal 3 oz (85 g) chicken breast. ? cup or 4 oz (106 g) of brown rice.  cup or 3 oz (85 g) of corn. 1 cup or 8 fl oz (237 mL) of milk. 1 cup or 5 oz (150 g) of strawberries with sugar-free whipped topping. Carbohydrate calculation Identify the foods that have carbohydrates: Rice. Corn. Milk. Strawberries. Calculate how many servings you have of each food: 2 servings of rice. 1 serving of corn. 1 serving of milk. 1 serving of strawberries. Multiply each number of servings by 15 g: 2 servings of rice x 15 g = 30 g. 1 serving of corn x 15 g = 15 g. 1 serving of milk x 15 g = 15 g. 1 serving of strawberries x 15 g = 15 g. Add together all of the amounts to find the total grams of carbohydrates eaten: 30 g + 15 g + 15 g + 15 g = 75 g of carbohydrates total. Where to find more information To learn more, go to: American Diabetes Association at diabetes.org. Click Search and type carb counting. Find the link you need. Centers for Disease Control and Prevention at TonerPromos.no. Click Search and type diabetes. Find the link you need. Academy of Nutrition and Dietetics: eatright.org This information is not intended to replace advice given to you by your health care provider. Make sure you discuss any questions you have with your health care provider. Document Revised: 04/19/2023 Document Reviewed: 04/19/2023 Elsevier Patient Education  2025 Elsevier Inc.Diabetes: Healthy Eating for Adults When you have diabetes, also called diabetes mellitus, it's important to have healthy eating habits. Your  blood sugar (glucose) levels are greatly affected by what you eat and drink. You need  to eat healthy foods in the right amounts, at about the same times each day. Doing this can help you: Manage your blood sugar. Lower your risk of heart disease. Improve your blood pressure. Reach or stay at a healthy weight. What can affect my meal plan? Every person with diabetes is different. And each person has different needs for a meal plan. Your health care provider may suggest that you work with an expert in healthy eating called a dietitian. They can help you make a meal plan that's best for you. How do carbohydrates affect me? Carbohydrates, also called carbs, affect your blood sugar level more than any other type of food. Eating carbs raises the amount of sugar in your blood. It's important to know how many carbs you can safely have in each meal. This is different for every person. Your dietitian can help you calculate how many carbs you should have at each meal and for each snack. How does alcohol affect me? Alcohol can cause a decrease in blood sugar (hypoglycemia), especially if you use insulin or take certain diabetes medicines by mouth. Hypoglycemia can be a life-threatening condition. Symptoms of hypoglycemia are similar to those of having too much alcohol. They include confusion, being sleepy, and feeling dizzy. Do not drink alcohol if: Your provider tells you not to drink. You're pregnant, may be pregnant, or plan to become pregnant. What are tips for following this plan? Reading food labels Start by checking the serving size on the Nutrition Facts label of packaged foods and drinks. The number of calories and the amount of carbs, fats, and other nutrients listed on the label are based on one serving of the item. Many items contain more than one serving per package. Check the total grams (g) of carbs in one serving. Check the number of grams of saturated fats and trans fats in one serving. Choose foods that have a low amount or none of these fats. Check the number of milligrams  (mg) of salt (sodium) in one serving. Most people should limit their total sodium intake to less than 2,300 mg per day. Always check the nutrition information of foods labeled as low-fat or nonfat. These foods may be higher in added sugar or refined carbs and should be avoided. Talk to your dietitian to identify your daily goals for nutrients listed on the label. Shopping Avoid buying canned, pre-made, or processed foods. These foods tend to be high in fat, sodium, and added sugar. Shop around the outside edge of the grocery store. This is where you'll most often find fresh fruits and vegetables, bulk grains, fresh meats, and fresh dairy products. Cooking Use low-heat cooking methods, such as baking, instead of high-heat methods like deep frying. Cook using healthy oils, such as olive, canola, or sunflower oil. Avoid cooking with butter, cream, or high-fat meats. Meal planning  Eat meals and snacks regularly. Try to eat them at the same times every day. Avoid going too long without eating. Eat foods that are high in fiber, such as fresh fruits, vegetables, beans, and whole grains. Eat 4-6 oz (112-168 g) of lean protein each day, such as lean meat, chicken, fish, eggs, or tofu. One ounce (oz) (28 g) of lean protein is equal to: 1 oz (28 g) of meat, chicken, or fish. 1 egg.  cup (62 g) of tofu. Eat some foods each day that contain healthy fats, such as avocado,  nuts, seeds, and fish. What foods should I eat? Fruits Berries. Apples. Oranges. Peaches. Apricots. Plums. Grapes. Mangoes. Papayas. Pomegranates. Kiwi. Cherries. Vegetables Leafy greens, including lettuce, spinach, kale, chard, collard greens, mustard greens, and cabbage. Beets. Cauliflower. Broccoli. Carrots. Green beans. Tomatoes. Peppers. Onions. Cucumbers. Brussels sprouts. Grains Whole grains, such as whole-wheat or whole-grain bread, crackers, tortillas, cereal, and pasta. Unsweetened oatmeal. Quinoa. Brown or wild  rice. Meats and other proteins Seafood. Poultry without skin. Lean cuts of poultry and beef. Tofu. Nuts. Seeds. Dairy Low-fat or fat-free dairy products such as milk, yogurt, and cheese. The items listed above may not be all the foods and drinks you can have. Talk with a dietitian to learn more. What foods should I avoid? Fruits Fruits canned with syrup. Vegetables Canned vegetables. Frozen vegetables with butter or cream sauce. Grains Refined white flour and flour products such as bread, pasta, snack foods, and cereals. Avoid all processed foods. Meats and other proteins Fatty cuts of meat. Poultry with skin. Breaded or fried meats. Processed meat. Avoid saturated fats. Dairy Full-fat yogurt, cheese, or milk. Beverages Sweetened drinks, such as soda or iced tea. The items listed above may not be all the foods and drinks you should avoid. Talk with a dietitian to learn more. Where to find more information: To learn more, go to: Academy of Nutrition and Dietetics at DeathPrevention.it. Click Search and type diabetes. Find the link you need. Centers for Disease Control and Prevention at TonerPromos.no. Click Search and type diabetes. Find the link you need. American Diabetes Association: diabetes.org/food-nutrition [employer] of Diabetes and Digestive and Kidney Diseases: StageSync.si This information is not intended to replace advice given to you by your health care provider. Make sure you discuss any questions you have with your health care provider. Document Revised: 04/20/2023 Document Reviewed: 04/20/2023 Elsevier Patient Education  2025 [employer].

## 2024-02-22 ENCOUNTER — Ambulatory Visit: Payer: Self-pay | Admitting: Family

## 2024-02-22 NOTE — Telephone Encounter (Signed)
 Pt's daughter, [place], [place] asking about PET scan denial from insurance.  She said please call back, this is urgent.

## 2024-02-22 NOTE — Telephone Encounter (Signed)
 Called [employer] at [phone]  S/W [place]  Submitted an appeal with the following info- significantly elevated PSA of 179. high suspicion for prostate cancer, most likely metastatic based on the significant PSA elevation.   The appeal process was expedited. Will receive update in 72h via fax and a phone call.   Case # submission for the appeal- [id number]  Call [phone]  Pts wife aware. Called daughter n/a.

## 2024-02-23 DIAGNOSIS — K625 Hemorrhage of anus and rectum: Secondary | ICD-10-CM | POA: Insufficient documentation

## 2024-02-23 DIAGNOSIS — G629 Polyneuropathy, unspecified: Secondary | ICD-10-CM | POA: Insufficient documentation

## 2024-02-23 NOTE — Assessment & Plan Note (Signed)
 No associated neck pain. Pending b12. Trial of gabapentin . Consider cervical spine imaging if symptom persists.

## 2024-02-23 NOTE — Assessment & Plan Note (Signed)
 Chronic, stable. Counseled on low glycemic diet. Referral to nutrition.

## 2024-02-23 NOTE — Assessment & Plan Note (Signed)
 Extremely PSA level ; discussed concern for prostate cancer. Awaiting PET scan for further evaluation by urology

## 2024-02-23 NOTE — Assessment & Plan Note (Addendum)
 Bright red blood likely from a lower GI source such as internal hemorrhoids. Hemoglobin 13.7. No hemorrhoids on exam. Monitor for recurrence and patient and family will let me know.  Plan GI referral for colonoscopy if bleeding recurs.

## 2024-02-23 NOTE — Assessment & Plan Note (Signed)
 Chronic, poorly controlled. Discussed ASCVD risk in setting of DM. He will resume crestor  5mg  and counseled on importance of compliance.

## 2024-02-28 NOTE — Telephone Encounter (Signed)
 Patient's daughter stopped by in person requesting an update on the status of the appeal for PET scan. She expressed concern regarding the patient's rising PSA level and is eager to get the scan scheduled as soon as possible.She is requesting a callback at [phone] to discuss the current status and next steps.

## 2024-02-29 ENCOUNTER — Ambulatory Visit: Admitting: Family

## 2024-02-29 NOTE — Telephone Encounter (Signed)
 [employer] denied Pet scan again on 10/10. See Denial under media tab.   Daughter aware. She is requesting we move forward with a bx. Will confirm with [facility] this is the next step. Next opening is 11/19.  Once confirmed will send info via my chart.    Daughter ok with this plan.    [facility] - ok to proceed with bx on 11/19 or after? Thanks.   CM

## 2024-02-29 NOTE — Telephone Encounter (Signed)
 Appt made.   Infor sent via my chart- daughter has access.   Called daughter and reviewed instructions. She voiced understanding.

## 2024-03-06 ENCOUNTER — Ambulatory Visit: Payer: Medicare PPO | Admitting: *Deleted

## 2024-03-06 VITALS — Ht 66.0 in | Wt 146.0 lb

## 2024-03-06 DIAGNOSIS — Z0000 Encounter for general adult medical examination without abnormal findings: Secondary | ICD-10-CM

## 2024-03-06 NOTE — Progress Notes (Signed)
 Subjective:   Joshua Sandoval is a 89 y.o. who presents for a Medicare Wellness preventive visit.  As a reminder, Annual Wellness Visits don't include a physical exam, and some assessments may be limited, especially if this visit is performed virtually. We may recommend an in-person follow-up visit with your provider if needed.  Visit Complete: Virtual I connected with  Joshua Sandoval on 03/06/24 by a audio enabled telemedicine application and verified that I am speaking with the correct person using two identifiers.  Patient Location: Home  Provider Location: Home Office  I discussed the limitations of evaluation and management by telemedicine. The patient expressed understanding and agreed to proceed.  Vital Signs: Because this visit was a virtual/telehealth visit, some criteria may be missing or patient reported. Any vitals not documented were not able to be obtained and vitals that have been documented are patient reported.  VideoDeclined- This patient declined Librarian, academic. Therefore the visit was completed with audio only.  Persons Participating in Visit: Patient assisted by wife Joshua Sandoval.  AWV Questionnaire: No: Patient Medicare AWV questionnaire was not completed prior to this visit.  Cardiac Risk Factors include: advanced age (>38men, >3 women);diabetes mellitus;male gender;dyslipidemia;hypertension     Objective:    Today's Vitals   03/06/24 1042  Weight: 146 lb (66.2 kg)  Height: 5' 6 (1.676 m)   Body mass index is 23.57 kg/m.     03/06/2024   11:03 AM 09/28/2023    8:34 AM 08/08/2023   10:23 AM 03/06/2023    4:09 PM 01/24/2023   10:13 AM 12/27/2022   10:57 AM 03/03/2022   10:46 AM  Advanced Directives  Does Patient Have a Medical Advance Directive? No No No No No No No  Would patient like information on creating a medical advance directive? No - Patient declined   Yes (MAU/Ambulatory/Procedural Areas - Information given) No - Patient  declined Yes (MAU/Ambulatory/Procedural Areas - Information given) Yes (MAU/Ambulatory/Procedural Areas - Information given)    Current Medications (verified) Outpatient Encounter Medications as of 03/06/2024  Medication Sig   amLODipine  (NORVASC ) 10 MG tablet TAKE 1 TABLET(10 MG) BY MOUTH DAILY   cyanocobalamin  (VITAMIN B12) 1000 MCG tablet Take 1 tablet (1,000 mcg total) by mouth daily.   donepezil  (ARICEPT ) 10 MG tablet Take 1 tablet (10 mg total) by mouth at bedtime.   gabapentin  (NEURONTIN ) 100 MG capsule Take 1 capsule (100 mg total) by mouth at bedtime as needed.   losartan  (COZAAR ) 25 MG tablet Take 1 tablet (25 mg total) by mouth daily.   rosuvastatin  (CRESTOR ) 5 MG tablet Take 1 tablet (5 mg total) by mouth daily.   aspirin EC 81 MG tablet Take 81 mg by mouth as needed.  (Patient not taking: Reported on 03/06/2024)   metoprolol  succinate (TOPROL -XL) 50 MG 24 hr tablet Take 1 tablet (50 mg total) by mouth daily with or immediately following a meal.. (Patient not taking: Reported on 03/06/2024)   No facility-administered encounter medications on file as of 03/06/2024.    Allergies (verified) Other   History: Past Medical History:  Diagnosis Date   Diastolic dysfunction    a. 12/2017 Echo: EF 50-55%, no rwma, Gr1 DD, nl RV fxn; b. 03/2020 Echo: EF 55-60%, no rwma, Gr1 DD, Mildly red RV fxn, triv MR, mild to mod AoV sclerosis w/o stenosis.   Elevated PSA    History of stress test    a. 01/2018 MV: EF 55%. No ischemia/infarct-->Low risk.   Hypertension  PVC's (premature ventricular contractions)    a. 02/2018 Holter: RSR, >7k PVCs (15% burden). 641 runs of NSVT up to 6 beats.   RBBB    Past Surgical History:  Procedure Laterality Date   CATARACT EXTRACTION W/PHACO Right 12/27/2022   Procedure: CATARACT EXTRACTION PHACO AND INTRAOCULAR LENS PLACEMENT (IOC) RIGHT 8.98 01:03.7;  Surgeon: Jaye Fallow, MD;  Location: [facility];  Service: Ophthalmology;  Laterality:  Right;   CATARACT EXTRACTION W/PHACO Left 01/24/2023   Procedure: CATARACT EXTRACTION PHACO AND INTRAOCULAR LENS PLACEMENT (IOC) LEFT 10.65 00:54.0;  Surgeon: Jaye Fallow, MD;  Location: [facility];  Service: Ophthalmology;  Laterality: Left;   NO PAST SURGERIES     Family History  Problem Relation Age of Onset   Hypertension Mother    Stroke Mother    Social History   Socioeconomic History   Marital status: Married    Spouse name: Not on file   Number of children: Not on file   Years of education: Not on file   Highest education level: Not on file  Occupational History   Not on file  Tobacco Use   Smoking status: Former    Current packs/day: 0.00    Types: Cigarettes    Quit date: 1980    Years since quitting: 45.8   Smokeless tobacco: Never  Vaping Use   Vaping status: Never Used  Substance and Sexual Activity   Alcohol use: Not Currently   Drug use: No   Sexual activity: Yes    Partners: Female  Other Topics Concern   Not on file  Social History Narrative   married   Social Drivers of Corporate investment banker Strain: Low Risk  (03/06/2024)   Overall Financial Resource Strain (CARDIA)    Difficulty of Paying Living Expenses: Not hard at all  Food Insecurity: No Food Insecurity (03/06/2024)   Hunger Vital Sign    Worried About Running Out of Food in the Last Year: Never true    Ran Out of Food in the Last Year: Never true  Transportation Needs: No Transportation Needs (03/06/2024)   PRAPARE - Administrator, Civil Service (Medical): No    Lack of Transportation (Non-Medical): No  Physical Activity: Inactive (03/06/2024)   Exercise Vital Sign    Days of Exercise per Week: 0 days    Minutes of Exercise per Session: 0 min  Stress: No Stress Concern Present (03/06/2024)   [employer] of Occupational Health - Occupational Stress Questionnaire    Feeling of Stress: Not at all  Social Connections: Socially Integrated (03/06/2024)    Social Connection and Isolation Panel    Frequency of Communication with Friends and Family: More than three times a week    Frequency of Social Gatherings with Friends and Family: More than three times a week    Attends Religious Services: More than 4 times per year    Active Member of [employer] or Organizations: Yes    Attends Engineer, structural: More than 4 times per year    Marital Status: Married    Tobacco Counseling Counseling given: Not Answered    Clinical Intake:  Pre-visit preparation completed: Yes  Pain : No/denies pain     BMI - recorded: 23.57 Nutritional Status: BMI of 19-24  Normal Nutritional Risks: None Diabetes: Yes CBG done?: No  Lab Results  Component Value Date   HGBA1C 6.7 (H) 02/08/2024   HGBA1C 6.5 10/14/2022   HGBA1C 6.3 (A) 04/04/2022  How often do you need to have someone help you when you read instructions, pamphlets, or other written materials from your doctor or pharmacy?: 1 - Never  Interpreter Needed?: No  Information entered by :: R. Aleighna Wojtas LPN   Activities of Daily Living     03/06/2024   10:46 AM 03/06/2024   10:43 AM  In your present state of health, do you have any difficulty performing the following activities:  Hearing? 1 0  Comment some issues   Vision?  0  Difficulty concentrating or making decisions?  1  Walking or climbing stairs?  0  Dressing or bathing?  0  Doing errands, shopping?  0  Preparing Food and eating ?  N  Using the Toilet?  N  In the past six months, have you accidently leaked urine?  Y  Do you have problems with loss of bowel control?  N  Managing your Medications?  N  Managing your Finances?  N  Housekeeping or managing your Housekeeping?  N    Patient Care Team: Dineen Rollene MATSU, FNP as PCP - General (Family Medicine) End, Lonni, MD as PCP - Cardiology (Cardiology) Francisca Redell BROCKS, MD as Consulting Physician (Urology)  I have updated your Care Teams any recent Medical  Services you may have received from other providers in the past year.     Assessment:   This is a routine wellness examination for Joshua Sandoval.  Hearing/Vision screen Hearing Screening - Comments:: Some issues Vision Screening - Comments:: glasses   Goals Addressed             This Visit's Progress    Patient Stated       Wants to get back into his regular exercising program       Depression Screen     03/06/2024   10:57 AM 02/20/2024   11:49 AM 02/08/2024   10:33 AM 08/17/2023   12:11 PM 08/10/2023    1:29 PM 03/06/2023    4:05 PM 10/14/2022    9:09 AM  PHQ 2/9 Scores  PHQ - 2 Score 0 0 0 0 0 0 0  PHQ- 9 Score 6 0  0 0 0     Fall Risk     03/06/2024   10:47 AM 02/20/2024   11:49 AM 02/08/2024   10:33 AM 08/17/2023   12:11 PM 08/10/2023    1:29 PM  Fall Risk   Falls in the past year? 0 0 0 0 0  Number falls in past yr: 0 0 0 0 0  Injury with Fall? 0 0 0 0 0  Risk for fall due to : No Fall Risks No Fall Risks No Fall Risks No Fall Risks No Fall Risks  Follow up Falls evaluation completed;Falls prevention discussed Falls evaluation completed Falls evaluation completed Falls evaluation completed Falls evaluation completed    MEDICARE RISK AT HOME:  Medicare Risk at Home Any stairs in or around the home?: Yes If so, are there any without handrails?: No Home free of loose throw rugs in walkways, pet beds, electrical cords, etc?: Yes Adequate lighting in your home to reduce risk of falls?: Yes Life alert?: No Use of a cane, walker or w/c?: No Grab bars in the bathroom?: Yes Shower chair or bench in shower?: No Elevated toilet seat or a handicapped toilet?: No  TIMED UP AND GO:  Was the test performed?  No  Cognitive Function: 6CIT completed        03/06/2024   11:03 AM  03/06/2023    4:10 PM 03/03/2022   11:15 AM 03/01/2021    1:39 PM 02/26/2019    1:22 PM  6CIT Screen  What Year? 4 points 0 points 0 points 0 points 0 points  What month? 3 points 0 points 0  points 0 points 0 points  What time? 0 points 0 points 0 points 0 points 0 points  Count back from 20 4 points 0 points 0 points  0 points  Months in reverse 4 points 4 points 0 points  0 points  Repeat phrase 10 points 8 points 0 points  2 points  Total Score 25 points 12 points 0 points  2 points    Immunizations Immunization History  Administered Date(s) Administered   Fluad Quad(high Dose 65+) 02/27/2019, 02/09/2022, 02/22/2022   INFLUENZA, HIGH DOSE SEASONAL PF 02/22/2017, 02/15/2021   Influenza, Seasonal, Injecte, Preservative Fre 04/22/2009, 03/07/2023   Influenza,inj,Quad PF,6+ Mos 03/07/2018   Moderna Sars-Covid-2 Vaccination 06/24/2019, 07/24/2019   Pneumococcal Conjugate-13 01/01/2019    Screening Tests Health Maintenance  Topic Date Due   FOOT EXAM  Never done   DTaP/Tdap/Td (1 - Tdap) Never done   Zoster Vaccines- Shingrix (1 of 2) Never done   COVID-19 Vaccine (3 - 2025-26 season) 01/15/2024   Medicare Annual Wellness (AWV)  03/05/2024   Pneumococcal Vaccine: 50+ Years (2 of 2 - PPSV23, PCV20, or PCV21) 08/09/2024 (Originally 02/26/2019)   Influenza Vaccine  08/13/2024 (Originally 12/15/2023)   HEMOGLOBIN A1C  08/07/2024   OPHTHALMOLOGY EXAM  09/20/2024   Meningococcal B Vaccine  Aged Out    Health Maintenance Items Addressed: Discussed the need to update flu and tetanus (Tdap) vaccines. Patient declines covid and shingles vaccines. Patient needs a diabetic foot exam at next  office visit.   Additional Screening:  Vision Screening: Recommended annual ophthalmology exams for early detection of glaucoma and other disorders of the eye. Is the patient up to date with their annual eye exam?  Yes  Who is the provider or what is the name of the office in which the patient attends annual eye exams?  [facility]  Dental Screening: Recommended annual dental exams for proper oral hygiene  Community Resource Referral / Chronic Care Management: CRR required this visit?   No   CCM required this visit?  No   Plan:    I have personally reviewed and noted the following in the patient's chart:   Medical and social history Use of alcohol, tobacco or illicit drugs  Current medications and supplements including opioid prescriptions. Patient is not currently taking opioid prescriptions. Functional ability and status Nutritional status Physical activity Advanced directives List of other physicians Hospitalizations, surgeries, and ER visits in previous 12 months Vitals Screenings to include cognitive, depression, and falls Referrals and appointments  In addition, I have reviewed and discussed with patient certain preventive protocols, quality metrics, and best practice recommendations. A written personalized care plan for preventive services as well as general preventive health recommendations were provided to patient.   Joshua Fredericks, LPN   [id number]   After Visit Summary: (MyChart) Due to this being a telephonic visit, the after visit summary with patients personalized plan was offered to patient via MyChart   Notes: Nothing significant to report at this time.  Patient needs a diabetic foot exam at next office visit and documented. Patient's wife stated that they do not have Metoprolol  which she is going to call the pharmacy and see if they have the script and if not  she will contact his cardiologist.

## 2024-03-06 NOTE — Patient Instructions (Signed)
 [name],  Thank you for taking the time for your Medicare Wellness Visit. I appreciate your continued commitment to your health goals. Please review the care plan we discussed, and feel free to reach out if I can assist you further.  Medicare recommends these wellness visits once per year to help you and your care team stay ahead of potential health issues. These visits are designed to focus on prevention, allowing your provider to concentrate on managing your acute and chronic conditions during your regular appointments.  Please note that Annual Wellness Visits do not include a physical exam. Some assessments may be limited, especially if the visit was conducted virtually. If needed, we may recommend a separate in-person follow-up with your provider.  Ongoing Care Seeing your primary care provider every 3 to 6 months helps us  monitor your health and provide consistent, personalized care.  Remember to get your flu and tetanus (Tdap) vaccines.   Referrals If a referral was made during today's visit and you haven't received any updates within two weeks, please contact the referred provider directly to check on the status.  Recommended Screenings:  Health Maintenance  Topic Date Due   Complete foot exam   Never done   DTaP/Tdap/Td vaccine (1 - Tdap) Never done   Zoster (Shingles) Vaccine (1 of 2) Never done   COVID-19 Vaccine (3 - 2025-26 season) 01/15/2024   Pneumococcal Vaccine for age over 83 (2 of 2 - PPSV23, PCV20, or PCV21) 08/09/2024*   Flu Shot  08/13/2024*   Hemoglobin A1C  08/07/2024   Eye exam for diabetics  09/20/2024   Medicare Annual Wellness Visit  03/06/2025   Meningitis B Vaccine  Aged Out  *Topic was postponed. The date shown is not the original due date.       03/06/2024   11:03 AM  Advanced Directives  Does Patient Have a Medical Advance Directive? No  Would patient like information on creating a medical advance directive? No - Patient declined   Advance Care  Planning is important because it: Ensures you receive medical care that aligns with your values, goals, and preferences. Provides guidance to your family and loved ones, reducing the emotional burden of decision-making during critical moments.  Vision: Annual vision screenings are recommended for early detection of glaucoma, cataracts, and diabetic retinopathy. These exams can also reveal signs of chronic conditions such as diabetes and high blood pressure.  Dental: Annual dental screenings help detect early signs of oral cancer, gum disease, and other conditions linked to overall health, including heart disease and diabetes.  Please see the attached documents for additional preventive care recommendations.

## 2024-03-26 ENCOUNTER — Telehealth: Payer: Self-pay

## 2024-03-26 ENCOUNTER — Encounter: Payer: Self-pay | Admitting: Pharmacist

## 2024-03-26 NOTE — Progress Notes (Signed)
 Pharmacy Quality Measure Review  This patient is appearing on a report for being at risk of failing the adherence measure for hypertension (ACEi/ARB) medications this calendar year.   Medication: losartan  25 mg Last fill date: 12/27/23 for 90 day supply  Insurance report was not up to date. No action needed at this time.  Medication has been refilled as of 03/25/24 x90ds Next refill due 2026.

## 2024-03-26 NOTE — Telephone Encounter (Signed)
 Copied from CRM #[id number]. Topic: General - Other >> Mar 26, 2024 11:32 AM Tobias CROME wrote: Reason for CRM: Reche with [employer] calling to speak to [facility] about a prior authorization for PET Imaging CT   Best callback number: [phone]

## 2024-03-28 ENCOUNTER — Encounter: Payer: Self-pay | Admitting: Pharmacist

## 2024-03-28 NOTE — Telephone Encounter (Signed)
 Noted

## 2024-03-28 NOTE — Progress Notes (Signed)
 Pharmacy Quality Measure Review  This patient is appearing on a report for being at risk of failing the adherence measure for hypertension (ACEi/ARB) medications this calendar year.   Medication: losartan  25 mg Last fill date: 8/13 for 90 day supply  Insurance report was not up to date. No action needed at this time.  Medication has been refilled as of 11/10 x90ds.  Next refill due 2026.

## 2024-04-03 ENCOUNTER — Ambulatory Visit: Admitting: Urology

## 2024-04-03 VITALS — BP 181/89 | HR 65

## 2024-04-03 DIAGNOSIS — Z2989 Encounter for other specified prophylactic measures: Secondary | ICD-10-CM | POA: Diagnosis not present

## 2024-04-03 DIAGNOSIS — R972 Elevated prostate specific antigen [PSA]: Secondary | ICD-10-CM | POA: Diagnosis not present

## 2024-04-03 DIAGNOSIS — C61 Malignant neoplasm of prostate: Secondary | ICD-10-CM

## 2024-04-03 MED ORDER — LEVOFLOXACIN 500 MG PO TABS
500.0000 mg | ORAL_TABLET | Freq: Once | ORAL | Status: AC
  Administered 2024-04-03: 500 mg via ORAL

## 2024-04-03 MED ORDER — GENTAMICIN SULFATE 40 MG/ML IJ SOLN
80.0000 mg | Freq: Once | INTRAMUSCULAR | Status: AC
  Administered 2024-04-03: 80 mg via INTRAMUSCULAR

## 2024-04-03 NOTE — Patient Instructions (Signed)

## 2024-04-03 NOTE — Progress Notes (Signed)
   04/03/24  Indication: Elevated PSA, 179  PSMA PET scan was ordered, but was refused by insurance  Prostate Biopsy Procedure   Informed consent was obtained, and we discussed the risks of bleeding and infection/sepsis. A time out was performed to ensure correct patient identity.  Pre-Procedure: - Last PSA Level: 179 - DRE: Nodular, firm, grossly abnormal - Gentamicin and levaquin given for antibiotic prophylaxis - Transrectal Ultrasound performed revealing a 42 gm prostate - Prostate heterogenous and abnormal, concern for local extension at the bladder neck into the bladder  Procedure: - Prostate block performed using 10 cc 1% lidocaine  and biopsies taken from sextant areas, a total of 12 under ultrasound guidance.  Post-Procedure: - Patient tolerated the procedure well - He was counseled to seek immediate medical attention if experiences significant bleeding, fevers, or severe pain - Return in one week to discuss biopsy results  Assessment/ Plan: Will follow up in 1-2 weeks to discuss pathology  Redell Burnet, MD 04/03/2024

## 2024-04-03 NOTE — Addendum Note (Signed)
 Addended by: ELOUISE SANTA BROCKS on: 04/03/2024 10:51 AM   Modules accepted: Orders

## 2024-04-08 LAB — PROSTATE CORE NEEDLE BIOPSY

## 2024-04-10 ENCOUNTER — Ambulatory Visit: Admitting: Urology

## 2024-04-10 VITALS — BP 145/54 | HR 75

## 2024-04-10 DIAGNOSIS — C61 Malignant neoplasm of prostate: Secondary | ICD-10-CM | POA: Diagnosis not present

## 2024-04-10 NOTE — Progress Notes (Signed)
   04/10/2024 12:58 PM   Joshua Sandoval 1932/11/23 [id number]  Reason for visit: New diagnosis of prostate cancer, reviewed biopsy results  History: 89 year old healthy male who was referred for PSA of 179 that was confirmed on repeat, insurance refused a PSMA PET scan and he underwent prostate biopsy 04/03/2024  Physical Exam: BP (!) 145/54 (BP Location: Left Arm, Patient Position: Sitting, Cuff Size: Large)   Pulse 75   SpO2 100%   Imaging/labs: Prostate biopsy 04/03/2024 42g prostate, all 12 cores with prostate adenocarcinoma, primarily high risk Gleason score 4+4=8 disease with max core involvement of 100%.  DRE grossly abnormal.  Transrectal ultrasound concerning for local extension at the bladder neck into the bladder  Today: He remains asymptomatic, no bone pain, no urinary symptoms, gross hematuria resolving postbiopsy Here with both his daughters today who provide most of the history and are very helpful  Plan:   Prostate cancer: We discussed his new diagnosis of prostate cancer, high suspicion for metastatic and locally advanced disease based on PSA greater than 100 as well as transrectal ultrasound findings.  We reviewed different treatment strategies for prostate cancer based on staging, but that hormone therapy/ADT is a mainstay of treatment.  Risks and benefits were discussed.  We also discussed watchful waiting as a potential option with his age and absence of symptoms.  His daughters are in agreement for PSMA PET scan for staging, and meeting with oncology to discuss systemic treatment options versus more of a palliative watchful waiting approach PSMA PET scan, call with results, referral placed to medical oncology for likely metastatic prostate cancer   Joshua JAYSON Burnet, MD  [facility] [address] [place], [place] [address] [phone]

## 2024-04-10 NOTE — Patient Instructions (Signed)
 Your biopsy showed high risk prostate cancer.  The next up is a PET scan to evaluate for any prostate cancer outside the prostate, as this changes treatment options.  We will call you with those results, we also placed a referral to oncology to discuss treatment options based on the PET scan results

## 2024-04-15 ENCOUNTER — Encounter: Payer: Self-pay | Admitting: Radiology

## 2024-04-17 ENCOUNTER — Telehealth: Payer: Self-pay | Admitting: Urology

## 2024-04-17 NOTE — Telephone Encounter (Signed)
 Kia from [facility] called regarding patient's appointment for PET INFUSION that was scheduled for Tuesday, April 23, 2024. She stated that this appointment has been cancelled, due to denial from insurance. She would like to know if there will be a peer to peer. She can be reached at [phone] ext [phone]. Please advise.

## 2024-04-19 NOTE — Telephone Encounter (Signed)
 Are you able to confirm that a Peer to peer needs to be done? I have not received anything from the insurance company on my end. Thanks

## 2024-04-23 ENCOUNTER — Ambulatory Visit

## 2024-04-24 NOTE — Telephone Encounter (Signed)
 So sorry to hear about your mom! No I have not received any updates and the family is quite concerned.

## 2024-04-26 ENCOUNTER — Inpatient Hospital Stay

## 2024-04-26 ENCOUNTER — Inpatient Hospital Stay: Attending: Internal Medicine | Admitting: Internal Medicine

## 2024-04-26 ENCOUNTER — Encounter: Payer: Self-pay | Admitting: Internal Medicine

## 2024-04-26 VITALS — BP 131/84 | HR 67 | Temp 97.1°F | Resp 20 | Ht 66.0 in | Wt 142.7 lb

## 2024-04-26 DIAGNOSIS — Z823 Family history of stroke: Secondary | ICD-10-CM | POA: Insufficient documentation

## 2024-04-26 DIAGNOSIS — C61 Malignant neoplasm of prostate: Secondary | ICD-10-CM | POA: Diagnosis not present

## 2024-04-26 DIAGNOSIS — Z79899 Other long term (current) drug therapy: Secondary | ICD-10-CM | POA: Insufficient documentation

## 2024-04-26 LAB — CBC WITH DIFFERENTIAL/PLATELET
Abs Immature Granulocytes: 0.03 K/uL (ref 0.00–0.07)
Basophils Absolute: 0 K/uL (ref 0.0–0.1)
Basophils Relative: 0 %
Eosinophils Absolute: 0.2 K/uL (ref 0.0–0.5)
Eosinophils Relative: 2 %
HCT: 41.7 % (ref 39.0–52.0)
Hemoglobin: 13.5 g/dL (ref 13.0–17.0)
Immature Granulocytes: 0 %
Lymphocytes Relative: 29 %
Lymphs Abs: 2.6 K/uL (ref 0.7–4.0)
MCH: 27.6 pg (ref 26.0–34.0)
MCHC: 32.4 g/dL (ref 30.0–36.0)
MCV: 85.1 fL (ref 80.0–100.0)
Monocytes Absolute: 0.7 K/uL (ref 0.1–1.0)
Monocytes Relative: 8 %
Neutro Abs: 5.3 K/uL (ref 1.7–7.7)
Neutrophils Relative %: 61 %
Platelets: 182 K/uL (ref 150–400)
RBC: 4.9 MIL/uL (ref 4.22–5.81)
RDW: 13.2 % (ref 11.5–15.5)
WBC: 8.8 K/uL (ref 4.0–10.5)
nRBC: 0 % (ref 0.0–0.2)

## 2024-04-26 LAB — COMPREHENSIVE METABOLIC PANEL WITH GFR
ALT: 9 U/L (ref 0–44)
AST: 21 U/L (ref 15–41)
Albumin: 4.4 g/dL (ref 3.5–5.0)
Alkaline Phosphatase: 54 U/L (ref 38–126)
Anion gap: 11 (ref 5–15)
BUN: 19 mg/dL (ref 8–23)
CO2: 26 mmol/L (ref 22–32)
Calcium: 10.2 mg/dL (ref 8.9–10.3)
Chloride: 102 mmol/L (ref 98–111)
Creatinine, Ser: 1.1 mg/dL (ref 0.61–1.24)
GFR, Estimated: >60 mL/min (ref >60)
Glucose, Bld: 108 mg/dL — ABNORMAL HIGH (ref 70–99)
Potassium: 4.8 mmol/L (ref 3.5–5.1)
Sodium: 139 mmol/L (ref 135–145)
Total Bilirubin: 1 mg/dL (ref 0.0–1.2)
Total Protein: 7.8 g/dL (ref 6.5–8.1)

## 2024-04-26 LAB — PSA: Prostatic Specific Antigen: 281.4 ng/mL — ABNORMAL HIGH (ref 0.00–4.00)

## 2024-04-26 NOTE — Assessment & Plan Note (Addendum)
#   Prostate cancer- # Prostate biopsy 04/03/2024 42g prostate, all 12 cores with prostate adenocarcinoma, primarily high risk Gleason score 4+4=8 disease with max core involvement of 100%.  DRE grossly abnormal.  Transrectal ultrasound concerning for local extension at the bladder neck into the bladder.  Dr. Etheleen.    # I had a long discussion the patient and his daughter regarding the concerning diagnosis of prostate cancer-which seems to be aggressive; and also with PSA above 100 concerning for metastatic disease.  However, unable to get the scan done because of insurance.  Discussed with urology.-Will order scan.  If this is an ongoing issue-will plan CT chest and pelvis and also a bone scan.  # #I had a long discussion regarding the concerning findings progressive disease.  Again discussed at length the importance of starting ADT-to treat his progressive prostate cancer.  I reviewed the mechanism of action of ADT/blocking testosterone.  Again reviewed the potential side effects including but not limited to-fatigue hot flashes loss of libido.  Also reviewed that bone health/cardiovascular as long-term complications.  I would recommend Firmagon.  Understands treatments are palliative not curative. Discussed treatments are in general indefinite; however treatment breaks were given based upon tolerance preference.   Discussed regarding use of novel androgen receptor antagonists.  Will discuss at next visit.  # weight loss/poor appetite secondary to underlying malignancy.-Recommend nutritional evaluation.   # Demenatia- on aricept   Thank you Dr.Sninski MD for allowing me to participate in the care of your pleasant patient. Please do not hesitate to contact me with questions or concerns in the interim.  Pet call daughter-   # DISPOSITION:  # PET scan 2 weeks- # Refer to nutrition/schedule along with Firmagon injection # labs today- cbc/cmp; PSA-  # firmagon next week # follow up in 5 weeks- MD;  labs- cbc/cmp; PSA- Firmagon- Dr.B  # 60 minutes face-to-face with the patient discussing the above plan of care; more than 50% of time spent on prognosis/ natural history; counseling and coordination.

## 2024-04-26 NOTE — Progress Notes (Signed)
 Patient has no concerns

## 2024-04-26 NOTE — Progress Notes (Signed)
 [facility] CONSULT NOTE  Patient Care Team: Dineen Rollene MATSU, FNP as PCP - General (Family Medicine) End, Lonni, MD as PCP - Cardiology (Cardiology) Francisca Redell BROCKS, MD as Consulting Physician (Urology) Rennie Cindy SAUNDERS, MD as Consulting Physician (Oncology)  CHIEF COMPLAINTS/PURPOSE OF CONSULTATION: prostate cancer  Oncology History   No problem history exists.    HISTORY OF PRESENTING ILLNESS: Patient ambulating- independently.   Alone/Accompanied by family.   Joshua Sandoval 89 y.o.  male pleasant patient with newly diagnosed prostate cancer has been referred by urology for further evaluation recommendations.  Discussed the use of AI scribe software for clinical note transcription with the patient, who gave verbal consent to proceed.  History of Present Illness   Joshua Sandoval is an 89 year old male with newly diagnosed high-grade, high-risk prostate cancer who presents for oncology consultation regarding management and staging.  He was found to have an elevated PSA of 179 in September 2025 after evaluation for genitourinary bleeding, which led to further testing. Biopsy was initially deferred due to the elevated PSA, but was ultimately performed on April 03, 2024. Staging PET scan has been recommended multiple times but has been denied by insurance, most recently on April 23, 2024. He has not had a repeat PSA since September. He denies dysuria or incontinence, but reports nocturia and increased urinary frequency. He has not had recurrent genitourinary bleeding since the initial episode.  Since summer 2025, he has experienced decreased appetite and unintentional weight loss from a baseline of 150-160 lbs to 140 lbs, with a recent 4 lb weight gain. He frequently feels cold and requires an electric heater for comfort.  He developed shingles involving the right leg, back, and hip in late summer/early fall 2025, followed by persistent postherpetic neuropathic pain  and numbness in the right leg and fingertips. These symptoms have improved but occasionally recur. He also reports intermittent swelling and numbness in his feet and knees.  He remains active, continues to drive, and participates in hobbies such as working on cars. 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      Review of Systems  Constitutional:  Positive for malaise/fatigue and weight loss. Negative for chills, diaphoresis and fever.  HENT:  Negative for nosebleeds and sore throat.   Eyes:  Negative for double vision.  Respiratory:  Negative for cough, hemoptysis, sputum production, shortness of breath and wheezing.   Cardiovascular:  Negative for chest pain, palpitations, orthopnea and leg swelling.  Gastrointestinal:  Negative for abdominal pain, blood in stool, constipation, diarrhea, heartburn, melena, nausea and vomiting.  Genitourinary:  Negative for dysuria, frequency and urgency.  Musculoskeletal:  Negative for back pain and joint pain.  Skin: Negative.  Negative for itching and rash.  Neurological:  Negative for dizziness, tingling, focal weakness, weakness and headaches.  Endo/Heme/Allergies:  Does not bruise/bleed easily.  Psychiatric/Behavioral:  Negative for depression. The patient is not nervous/anxious and does not have insomnia.     MEDICAL HISTORY:  Past Medical History:  Diagnosis Date   Diastolic dysfunction    a. 12/2017 Echo: EF 50-55%, no rwma, Gr1 DD, nl RV fxn; b. 03/2020 Echo: EF 55-60%, no rwma, Gr1 DD, Mildly red RV fxn, triv MR, mild to mod AoV sclerosis w/o stenosis.   Elevated PSA    History of stress test    a. 01/2018 MV: EF 55%. No ischemia/infarct-->Low risk.   Hypertension    Prostate cancer (  HCC)    PVC's (premature ventricular contractions)    a. 02/2018 Holter: RSR, >7k PVCs (15% burden). 641 runs of NSVT up to 6 beats.   RBBB     SURGICAL HISTORY: Past Surgical  History:  Procedure Laterality Date   CATARACT EXTRACTION W/PHACO Right 12/27/2022   Procedure: CATARACT EXTRACTION PHACO AND INTRAOCULAR LENS PLACEMENT (IOC) RIGHT 8.98 01:03.7;  Surgeon: Jaye Fallow, MD;  Location: [facility];  Service: Ophthalmology;  Laterality: Right;   CATARACT EXTRACTION W/PHACO Left 01/24/2023   Procedure: CATARACT EXTRACTION PHACO AND INTRAOCULAR LENS PLACEMENT (IOC) LEFT 10.65 00:54.0;  Surgeon: Jaye Fallow, MD;  Location: [facility];  Service: Ophthalmology;  Laterality: Left;   NO PAST SURGERIES      SOCIAL HISTORY: Social History   Socioeconomic History   Marital status: Married    Spouse name: Not on file   Number of children: Not on file   Years of education: Not on file   Highest education level: Not on file  Occupational History   Not on file  Tobacco Use   Smoking status: Former    Types: Cigars    Quit date: 1980    Years since quitting: 45.9   Smokeless tobacco: Never  Vaping Use   Vaping status: Never Used  Substance and Sexual Activity   Alcohol use: Not Currently   Drug use: No   Sexual activity: Not Currently    Partners: Female  Other Topics Concern   Not on file  Social History Narrative   married   Social Drivers of Health   Tobacco Use: Medium Risk (04/26/2024)   Patient History    Smoking Tobacco Use: Former    Smokeless Tobacco Use: Never    Passive Exposure: Not on Actuary Strain: Low Risk (03/06/2024)   Overall Financial Resource Strain (CARDIA)    Difficulty of Paying Living Expenses: Not hard at all  Food Insecurity: No Food Insecurity (04/26/2024)   Epic    Worried About Radiation Protection Practitioner of Food in the Last Year: Never true    Ran Out of Food in the Last Year: Never true  Transportation Needs: No Transportation Needs (04/26/2024)   Epic    Lack of Transportation (Medical): No    Lack of Transportation (Non-Medical): No  Physical Activity: Inactive (03/06/2024)    Exercise Vital Sign    Days of Exercise per Week: 0 days    Minutes of Exercise per Session: 0 min  Stress: No Stress Concern Present (03/06/2024)   [employer] of Occupational Health - Occupational Stress Questionnaire    Feeling of Stress: Not at all  Social Connections: Socially Integrated (03/06/2024)   Social Connection and Isolation Panel    Frequency of Communication with Friends and Family: More than three times a week    Frequency of Social Gatherings with Friends and Family: More than three times a week    Attends Religious Services: More than 4 times per year    Active Member of Clubs or Organizations: Yes    Attends Banker Meetings: More than 4 times per year    Marital Status: Married  Catering Manager Violence: Not At Risk (04/26/2024)   Epic    Fear of Current or Ex-Partner: No    Emotionally Abused: No    Physically Abused: No    Sexually Abused: No  Depression (PHQ2-9): Low Risk (04/26/2024)   Depression (PHQ2-9)    PHQ-2 Score: 0  Recent Concern: Depression (PHQ2-9) - Medium  Risk (03/06/2024)   Depression (PHQ2-9)    PHQ-2 Score: 6  Alcohol Screen: Low Risk (03/06/2024)   Alcohol Screen    Last Alcohol Screening Score (AUDIT): 0  Housing: Low Risk (04/26/2024)   Epic    Unable to Pay for Housing in the Last Year: No    Number of Times Moved in the Last Year: 0    Homeless in the Last Year: No  Utilities: Not At Risk (04/26/2024)   Epic    Threatened with loss of utilities: No  Health Literacy: Inadequate Health Literacy (03/06/2024)   B1300 Health Literacy    Frequency of need for help with medical instructions: Sometimes    FAMILY HISTORY: Family History  Problem Relation Age of Onset   Hypertension Mother    Stroke Mother     ALLERGIES:  is allergic to other.  MEDICATIONS:  Current Outpatient Medications  Medication Sig Dispense Refill   amLODipine  (NORVASC ) 10 MG tablet TAKE 1 TABLET(10 MG) BY MOUTH DAILY 90 tablet 3    aspirin EC 81 MG tablet Take 81 mg by mouth as needed.      cyanocobalamin  (VITAMIN B12) 1000 MCG tablet Take 1 tablet (1,000 mcg total) by mouth daily. 90 tablet 3   donepezil  (ARICEPT ) 10 MG tablet Take 1 tablet (10 mg total) by mouth at bedtime. 90 tablet 3   gabapentin  (NEURONTIN ) 100 MG capsule Take 1 capsule (100 mg total) by mouth at bedtime as needed. 90 capsule 1   losartan  (COZAAR ) 25 MG tablet Take 1 tablet (25 mg total) by mouth daily. 90 tablet 3   metoprolol  succinate (TOPROL -XL) 50 MG 24 hr tablet Take 1 tablet (50 mg total) by mouth daily with or immediately following a meal.. 90 tablet 3   rosuvastatin  (CRESTOR ) 5 MG tablet Take 1 tablet (5 mg total) by mouth daily. 90 tablet 3   No current facility-administered medications for this visit.    PHYSICAL EXAMINATION:   Vitals:   04/26/24 1411  BP: 131/84  Pulse: 67  Resp: 20  Temp: (!) 97.1 F (36.2 C)  SpO2: 100%   Filed Weights   04/26/24 1411  Weight: 142 lb 11.2 oz (64.7 kg)    Physical Exam Vitals and nursing note reviewed.  HENT:     Head: Normocephalic and atraumatic.     Mouth/Throat:     Pharynx: Oropharynx is clear.  Eyes:     Extraocular Movements: Extraocular movements intact.     Pupils: Pupils are equal, round, and reactive to light.  Cardiovascular:     Rate and Rhythm: Normal rate and regular rhythm.  Pulmonary:     Comments: Decreased breath sounds bilaterally.  Abdominal:     Palpations: Abdomen is soft.  Musculoskeletal:        General: Normal range of motion.     Cervical back: Normal range of motion.  Skin:    General: Skin is warm.  Neurological:     General: No focal deficit present.     Mental Status: He is alert and oriented to person, place, and time.  Psychiatric:        Behavior: Behavior normal.        Judgment: Judgment normal.     LABORATORY DATA:  I have reviewed the data as listed Lab Results  Component Value Date   WBC 8.8 04/26/2024   HGB 13.5 04/26/2024    HCT 41.7 04/26/2024   MCV 85.1 04/26/2024   PLT 182 04/26/2024   Recent  Labs    08/10/23 1418 02/08/24 1125 04/26/24 1516  NA 136 139 139  K 4.8 4.6 4.8  CL 98 100 102  CO2 26 30 26   GLUCOSE 115* 104* 108*  BUN 18 19 19   CREATININE 1.23 0.88 1.10  CALCIUM  9.9 10.0 10.2  GFRNONAA  --   --  >60  PROT 7.7 7.2 7.8  ALBUMIN 4.4 4.4 4.4  AST 21 16 21   ALT 11 8 9   ALKPHOS 46 43 54  BILITOT 0.9 0.7 1.0    RADIOGRAPHIC STUDIES: I have personally reviewed the radiological images as listed and agreed with the findings in the report. No results found.   Prostate cancer [facility]) # Prostate cancer- # Prostate biopsy 04/03/2024 42g prostate, all 12 cores with prostate adenocarcinoma, primarily high risk Gleason score 4+4=8 disease with max core involvement of 100%.  DRE grossly abnormal.  Transrectal ultrasound concerning for local extension at the bladder neck into the bladder.  Dr. Etheleen.    # I had a long discussion the patient and his daughter regarding the concerning diagnosis of prostate cancer-which seems to be aggressive; and also with PSA above 100 concerning for metastatic disease.  However, unable to get the scan done because of insurance.  Discussed with urology.-Will order scan.  If this is an ongoing issue-will plan CT chest and pelvis and also a bone scan.  # #I had a long discussion regarding the concerning findings progressive disease.  Again discussed at length the importance of starting ADT-to treat his progressive prostate cancer.  I reviewed the mechanism of action of ADT/blocking testosterone.  Again reviewed the potential side effects including but not limited to-fatigue hot flashes loss of libido.  Also reviewed that bone health/cardiovascular as long-term complications.  I would recommend Firmagon.  Understands treatments are palliative not curative. Discussed treatments are in general indefinite; however treatment breaks were given based upon tolerance preference.    Discussed regarding use of novel androgen receptor antagonists.  Will discuss at next visit.  # weight loss/poor appetite secondary to underlying malignancy.-Recommend nutritional evaluation.   # Demenatia- on aricept   Thank you Dr.Sninski MD for allowing me to participate in the care of your pleasant patient. Please do not hesitate to contact me with questions or concerns in the interim.  Pet call daughter-   # DISPOSITION:  # PET scan 2 weeks- # Refer to nutrition/schedule along with Firmagon injection # labs today- cbc/cmp; PSA-  # firmagon next week # follow up in 5 weeks- MD; labs- cbc/cmp; PSA- Firmagon- Dr.B  # 60 minutes face-to-face with the patient discussing the above plan of care; more than 50% of time spent on prognosis/ natural history; counseling and coordination.  Above plan of care was discussed with patient/family in detail.  My contact information was given to the patient/family.     Cindy JONELLE Joe, MD 04/26/2024 4:06 PM

## 2024-04-27 ENCOUNTER — Ambulatory Visit: Payer: Self-pay | Admitting: Internal Medicine

## 2024-05-03 ENCOUNTER — Inpatient Hospital Stay

## 2024-05-03 DIAGNOSIS — C61 Malignant neoplasm of prostate: Secondary | ICD-10-CM | POA: Diagnosis not present

## 2024-05-03 MED ORDER — DEGARELIX ACETATE(240 MG DOSE) 120 MG/VIAL ~~LOC~~ SOLR
240.0000 mg | Freq: Once | SUBCUTANEOUS | Status: AC
  Administered 2024-05-03: 240 mg via SUBCUTANEOUS
  Filled 2024-05-03: qty 6

## 2024-05-13 ENCOUNTER — Ambulatory Visit
Admission: RE | Admit: 2024-05-13 | Discharge: 2024-05-13 | Disposition: A | Discharge status: Home / Self Care | Source: Ambulatory Visit | Attending: Internal Medicine | Admitting: Internal Medicine

## 2024-05-13 ENCOUNTER — Ambulatory Visit: Admitting: Family

## 2024-05-13 ENCOUNTER — Encounter: Payer: Self-pay | Admitting: Internal Medicine

## 2024-05-13 DIAGNOSIS — C7951 Secondary malignant neoplasm of bone: Secondary | ICD-10-CM | POA: Insufficient documentation

## 2024-05-13 DIAGNOSIS — C61 Malignant neoplasm of prostate: Secondary | ICD-10-CM | POA: Diagnosis present

## 2024-05-13 MED ORDER — FLOTUFOLASTAT F 18 GALLIUM 296-5846 MBQ/ML IV SOLN
8.0000 | Freq: Once | INTRAVENOUS | Status: AC
  Administered 2024-05-13: 8.53 via INTRAVENOUS

## 2024-05-21 ENCOUNTER — Other Ambulatory Visit: Payer: Self-pay | Admitting: Internal Medicine

## 2024-05-21 ENCOUNTER — Telehealth: Payer: Self-pay | Admitting: Internal Medicine

## 2024-05-21 NOTE — Telephone Encounter (Signed)
 x

## 2024-05-21 NOTE — Progress Notes (Signed)
 Spoke to patient's daughter regarding the results of the PET scan-widespread metastases and also possible rectal malignancy.  Daughter Interested in moving the appointments up-please move the patient's appointments to next available/after speak with the daughter Hinch.  Keep Firmagon  appointment as planned on the 16th, JAN. GB

## 2024-05-24 ENCOUNTER — Encounter: Payer: Self-pay | Admitting: Internal Medicine

## 2024-05-24 ENCOUNTER — Inpatient Hospital Stay: Admitting: Internal Medicine

## 2024-05-24 ENCOUNTER — Inpatient Hospital Stay: Attending: Internal Medicine

## 2024-05-24 VITALS — BP 136/69 | HR 59 | Temp 97.2°F | Resp 18 | Ht 66.0 in | Wt 144.1 lb

## 2024-05-24 DIAGNOSIS — C61 Malignant neoplasm of prostate: Secondary | ICD-10-CM

## 2024-05-24 LAB — CBC WITH DIFFERENTIAL (CANCER CENTER ONLY)
Abs Immature Granulocytes: 0.01 K/uL (ref 0.00–0.07)
Basophils Absolute: 0 K/uL (ref 0.0–0.1)
Basophils Relative: 0 %
Eosinophils Absolute: 0.3 K/uL (ref 0.0–0.5)
Eosinophils Relative: 4 %
HCT: 36.8 % — ABNORMAL LOW (ref 39.0–52.0)
Hemoglobin: 12.4 g/dL — ABNORMAL LOW (ref 13.0–17.0)
Immature Granulocytes: 0 %
Lymphocytes Relative: 33 %
Lymphs Abs: 2.3 K/uL (ref 0.7–4.0)
MCH: 28.4 pg (ref 26.0–34.0)
MCHC: 33.7 g/dL (ref 30.0–36.0)
MCV: 84.2 fL (ref 80.0–100.0)
Monocytes Absolute: 0.6 K/uL (ref 0.1–1.0)
Monocytes Relative: 9 %
Neutro Abs: 3.8 K/uL (ref 1.7–7.7)
Neutrophils Relative %: 54 %
Platelet Count: 158 K/uL (ref 150–400)
RBC: 4.37 MIL/uL (ref 4.22–5.81)
RDW: 13.2 % (ref 11.5–15.5)
WBC Count: 7.1 K/uL (ref 4.0–10.5)
nRBC: 0 % (ref 0.0–0.2)

## 2024-05-24 LAB — CMP (CANCER CENTER ONLY)
ALT: 21 U/L (ref 0–44)
AST: 32 U/L (ref 15–41)
Albumin: 4.2 g/dL (ref 3.5–5.0)
Alkaline Phosphatase: 55 U/L (ref 38–126)
Anion gap: 10 (ref 5–15)
BUN: 22 mg/dL (ref 8–23)
CO2: 25 mmol/L (ref 22–32)
Calcium: 9.9 mg/dL (ref 8.9–10.3)
Chloride: 104 mmol/L (ref 98–111)
Creatinine: 1.24 mg/dL (ref 0.61–1.24)
GFR, Estimated: 56 mL/min — ABNORMAL LOW
Glucose, Bld: 117 mg/dL — ABNORMAL HIGH (ref 70–99)
Potassium: 4.8 mmol/L (ref 3.5–5.1)
Sodium: 139 mmol/L (ref 135–145)
Total Bilirubin: 0.7 mg/dL (ref 0.0–1.2)
Total Protein: 7.2 g/dL (ref 6.5–8.1)

## 2024-05-24 LAB — PSA: Prostatic Specific Antigen: 28.7 ng/mL — ABNORMAL HIGH (ref 0.00–4.00)

## 2024-05-24 NOTE — Progress Notes (Unsigned)
 PET 05/13/24.  C/o having trouble sleeping and asking for something to help.  The only med he can relay is gabapentin .  Still having some bleeding from the prostate biopsy. Daughter is asking if this is normal or something he will have to deal with? Daughter states pt will always say he is ok even if he is not.

## 2024-05-24 NOTE — Progress Notes (Unsigned)
 [facility] CONSULT NOTE  Patient Care Team: Dineen Rollene MATSU, FNP as PCP - General (Family Medicine) End, Lonni, MD as PCP - Cardiology (Cardiology) Francisca Redell BROCKS, MD as Consulting Physician (Urology) Rennie Cindy SAUNDERS, MD as Consulting Physician (Oncology)  CHIEF COMPLAINTS/PURPOSE OF CONSULTATION: prostate cancer  Oncology History  Prostate cancer [facility])  04/26/2024 Initial Diagnosis   Prostate cancer (HCC)   05/24/2024 Cancer Staging   Staging form: Prostate, AJCC 8th Edition - Clinical: Stage IVB (cT4, cN1, cM1c) - Signed by Rennie Cindy SAUNDERS, MD on 05/24/2024     HISTORY OF PRESENTING ILLNESS: Patient ambulating- independently.  Accompanied by daughter  Joshua Sandoval 89 y.o.  male pleasant patient with fairly advanced dementia with newly diagnosed prostate cancer - high volume; on eligard is here for a follow up.   Discussed the use of AI scribe software for clinical note transcription with the patient, who gave verbal consent to proceed.  History of Present Illness   Joshua Sandoval is an 89 year old male with metastatic, high-risk, high-volume, hormone-sensitive prostate adenocarcinoma who presents for oncology follow-up and review of recent PET scan.  He is receiving monthly degarelix  injections for metastatic prostate cancer, initiated on May 03, 2024. He has not received chemotherapy or surgery, and oral anti-androgen therapy has not been started. He reports no pain, urinary symptoms, or functional decline. He is independent in activities of daily living and denies fatigue or malaise. The only reported treatment-related side effect is hot flashes, described as sudden episodes of warmth, particularly nocturnally.  There is a history of intermittent rectal bleeding, initially noted during his diagnostic workup in November. Approximately one week ago, his daughter observed blood in the toilet, but he denies ongoing rectal bleeding and states this last  occurred a long time ago. His hemoglobin is 12.4 g/dL. He reports chronic changes in bowel habits, including brown, loose stools, frequent flatulence, and occasional incontinence requiring use of a pull-up. He denies constipation, abdominal pain, or dyschezia. Over-the-counter simethicone has not provided symptom relief.  He experiences chronic insomnia, attributing it to excessive rumination. Family members report nocturnal wakefulness and pacing, ongoing for several months. He has not trialed melatonin or other pharmacologic sleep aids.  He remains active and independent. Family is engaged in care and advanced care planning discussions.     Discussed the use of AI scribe software for clinical note transcription with the patient, who gave verbal consent to proceed.  History of Present Illness   Joshua Sandoval is an 89 year old male with metastatic, high-risk, high-volume, hormone-sensitive prostate adenocarcinoma who presents for oncology follow-up and review of recent PET scan.  He is receiving monthly degarelix  injections for metastatic prostate cancer, initiated on May 03, 2024. He has not received chemotherapy or surgery, and oral anti-androgen therapy has not been started. He reports no pain, urinary symptoms, or functional decline. He is independent in activities of daily living and denies fatigue or malaise. The only reported treatment-related side effect is hot flashes, described as sudden episodes of warmth, particularly nocturnally.  There is a history of intermittent rectal bleeding, initially noted during his diagnostic workup in November. Approximately one week ago, his daughter observed blood in the toilet, but he denies ongoing rectal bleeding and states this last occurred a long time ago. His hemoglobin is 12.4 g/dL. He reports chronic changes in bowel habits, including brown, loose stools, frequent flatulence, and occasional incontinence requiring use of a pull-up. He denies  constipation, abdominal pain, or dyschezia. Over-the-counter simethicone has not  provided symptom relief.  He experiences chronic insomnia, attributing it to excessive rumination. Family members report nocturnal wakefulness and pacing, ongoing for several months. He has not trialed melatonin or other pharmacologic sleep aids.  He remains active and independent. Family is engaged in care and advanced care planning discussions.      Review of Systems  Unable to perform ROS: Dementia    MEDICAL HISTORY:  Past Medical History:  Diagnosis Date   Diastolic dysfunction    a. 12/2017 Echo: EF 50-55%, no rwma, Gr1 DD, nl RV fxn; b. 03/2020 Echo: EF 55-60%, no rwma, Gr1 DD, Mildly red RV fxn, triv MR, mild to mod AoV sclerosis w/o stenosis.   Elevated PSA    History of stress test    a. 01/2018 MV: EF 55%. No ischemia/infarct-->Low risk.   Hypertension    Prostate cancer (HCC)    PVC's (premature ventricular contractions)    a. 02/2018 Holter: RSR, >7k PVCs (15% burden). 641 runs of NSVT up to 6 beats.   RBBB     SURGICAL HISTORY: Past Surgical History:  Procedure Laterality Date   CATARACT EXTRACTION W/PHACO Right 12/27/2022   Procedure: CATARACT EXTRACTION PHACO AND INTRAOCULAR LENS PLACEMENT (IOC) RIGHT 8.98 01:03.7;  Surgeon: Jaye Fallow, MD;  Location: [facility];  Service: Ophthalmology;  Laterality: Right;   CATARACT EXTRACTION W/PHACO Left 01/24/2023   Procedure: CATARACT EXTRACTION PHACO AND INTRAOCULAR LENS PLACEMENT (IOC) LEFT 10.65 00:54.0;  Surgeon: Jaye Fallow, MD;  Location: [facility];  Service: Ophthalmology;  Laterality: Left;   NO PAST SURGERIES      SOCIAL HISTORY: Social History   Socioeconomic History   Marital status: Married    Spouse name: Not on file   Number of children: Not on file   Years of education: Not on file   Highest education level: Not on file  Occupational History   Not on file  Tobacco Use   Smoking status: Former     Types: Cigars    Quit date: 1980    Years since quitting: 46.0   Smokeless tobacco: Never  Vaping Use   Vaping status: Never Used  Substance and Sexual Activity   Alcohol use: Not Currently   Drug use: No   Sexual activity: Not Currently    Partners: Female  Other Topics Concern   Not on file  Social History Narrative   married   Social Drivers of Health   Tobacco Use: Medium Risk (05/24/2024)   Patient History    Smoking Tobacco Use: Former    Smokeless Tobacco Use: Never    Passive Exposure: Not on Actuary Strain: Low Risk (03/06/2024)   Overall Financial Resource Strain (CARDIA)    Difficulty of Paying Living Expenses: Not hard at all  Food Insecurity: No Food Insecurity (04/26/2024)   Epic    Worried About Radiation Protection Practitioner of Food in the Last Year: Never true    Ran Out of Food in the Last Year: Never true  Transportation Needs: No Transportation Needs (04/26/2024)   Epic    Lack of Transportation (Medical): No    Lack of Transportation (Non-Medical): No  Physical Activity: Inactive (03/06/2024)   Exercise Vital Sign    Days of Exercise per Week: 0 days    Minutes of Exercise per Session: 0 min  Stress: No Stress Concern Present (03/06/2024)   [employer] of Occupational Health - Occupational Stress Questionnaire    Feeling of Stress: Not at all  Social Connections:  Socially Integrated (03/06/2024)   Social Connection and Isolation Panel    Frequency of Communication with Friends and Family: More than three times a week    Frequency of Social Gatherings with Friends and Family: More than three times a week    Attends Religious Services: More than 4 times per year    Active Member of Clubs or Organizations: Yes    Attends Banker Meetings: More than 4 times per year    Marital Status: Married  Catering Manager Violence: Not At Risk (04/26/2024)   Epic    Fear of Current or Ex-Partner: No    Emotionally Abused: No    Physically  Abused: No    Sexually Abused: No  Depression (PHQ2-9): Low Risk (05/24/2024)   Depression (PHQ2-9)    PHQ-2 Score: 0  Recent Concern: Depression (PHQ2-9) - Medium Risk (03/06/2024)   Depression (PHQ2-9)    PHQ-2 Score: 6  Alcohol Screen: Low Risk (03/06/2024)   Alcohol Screen    Last Alcohol Screening Score (AUDIT): 0  Housing: Low Risk (04/26/2024)   Epic    Unable to Pay for Housing in the Last Year: No    Number of Times Moved in the Last Year: 0    Homeless in the Last Year: No  Utilities: Not At Risk (04/26/2024)   Epic    Threatened with loss of utilities: No  Health Literacy: Inadequate Health Literacy (03/06/2024)   B1300 Health Literacy    Frequency of need for help with medical instructions: Sometimes    FAMILY HISTORY: Family History  Problem Relation Age of Onset   Hypertension Mother    Stroke Mother     ALLERGIES:  is allergic to other.  MEDICATIONS:  Current Outpatient Medications  Medication Sig Dispense Refill   amLODipine  (NORVASC ) 10 MG tablet TAKE 1 TABLET(10 MG) BY MOUTH DAILY 90 tablet 3   aspirin EC 81 MG tablet Take 81 mg by mouth as needed.      cyanocobalamin  (VITAMIN B12) 1000 MCG tablet Take 1 tablet (1,000 mcg total) by mouth daily. 90 tablet 3   donepezil  (ARICEPT ) 10 MG tablet Take 1 tablet (10 mg total) by mouth at bedtime. 90 tablet 3   gabapentin  (NEURONTIN ) 100 MG capsule Take 1 capsule (100 mg total) by mouth at bedtime as needed. 90 capsule 1   losartan  (COZAAR ) 25 MG tablet Take 1 tablet (25 mg total) by mouth daily. 90 tablet 3   metoprolol  succinate (TOPROL -XL) 50 MG 24 hr tablet Take 1 tablet (50 mg total) by mouth daily with or immediately following a meal.. 90 tablet 3   rosuvastatin  (CRESTOR ) 5 MG tablet Take 1 tablet (5 mg total) by mouth daily. 90 tablet 3   No current facility-administered medications for this visit.    PHYSICAL EXAMINATION:   Vitals:   05/24/24 1455  BP: 136/69  Pulse: (!) 59  Resp: 18  Temp: (!)  97.2 F (36.2 C)  SpO2: 99%   Filed Weights   05/24/24 1455  Weight: 144 lb 1.6 oz (65.4 kg)    Physical Exam Vitals and nursing note reviewed.  HENT:     Head: Normocephalic and atraumatic.     Mouth/Throat:     Pharynx: Oropharynx is clear.  Eyes:     Extraocular Movements: Extraocular movements intact.     Pupils: Pupils are equal, round, and reactive to light.  Cardiovascular:     Rate and Rhythm: Normal rate and regular rhythm.  Pulmonary:  Comments: Decreased breath sounds bilaterally.  Abdominal:     Palpations: Abdomen is soft.  Musculoskeletal:        General: Normal range of motion.     Cervical back: Normal range of motion.  Skin:    General: Skin is warm.  Neurological:     General: No focal deficit present.     Mental Status: He is alert and oriented to person, place, and time.  Psychiatric:        Behavior: Behavior normal.        Judgment: Judgment normal.     LABORATORY DATA:  I have reviewed the data as listed Lab Results  Component Value Date   WBC 7.1 05/24/2024   HGB 12.4 (L) 05/24/2024   HCT 36.8 (L) 05/24/2024   MCV 84.2 05/24/2024   PLT 158 05/24/2024   Recent Labs    02/08/24 1125 04/26/24 1516 05/24/24 1513  NA 139 139 139  K 4.6 4.8 4.8  CL 100 102 104  CO2 30 26 25   GLUCOSE 104* 108* 117*  BUN 19 19 22   CREATININE 0.88 1.10 1.24  CALCIUM  10.0 10.2 9.9  GFRNONAA  --  >60 56*  PROT 7.2 7.8 7.2  ALBUMIN 4.4 4.4 4.2  AST 16 21 32  ALT 8 9 21   ALKPHOS 43 54 55  BILITOT 0.7 1.0 0.7    RADIOGRAPHIC STUDIES: I have personally reviewed the radiological images as listed and agreed with the findings in the report. NM PET (PSMA) SKULL TO MID THIGH Result Date: 05/21/2024 CLINICAL DATA:  Newly diagnosed prostate carcinoma.  Staging. EXAM: NUCLEAR MEDICINE PET SKULL BASE TO THIGH TECHNIQUE: 8.5 mCi Flotufolastat (Posluma ) was injected intravenously. Full-ring PET imaging was performed from the skull base to thigh after the  radiotracer. CT data was obtained and used for attenuation correction and anatomic localization. COMPARISON:  None Available. FINDINGS: NECK Two sub-cm bilateral mid jugular lymph nodes are seen which show mild radiotracer accumulation, with SUV max of 4.0 on the right and 3.2 on the left. Metastatic disease cannot be excluded. Incidental CT finding: None. CHEST Sub-cm mediastinal lymph nodes in the subcarinal and precarinal regions show mild radiotracer accumulation, with SUV max measuring up to 3.3. No suspicious pulmonary nodules on the CT scan. Incidental CT finding: None. ABDOMEN/PELVIS Prostate: Radiotracer accumulation is seen throughout the majority of the prostate gland, with SUV max of 12.4. Radiotracer accumulation is also seen within the left lateral and anterior walls of the rectum, with SUV max of 7.3. This corresponds to rectal wall thickening on CT images, although there is no evidence of tumor extension from the prostate gland. Primary rectal carcinoma cannot be excluded. Lymph nodes: Sub-centimeter lymph nodes with radiotracer accumulation are seen in the left internal iliac chain, bilateral common iliac chains, and left paraaortic region and aortocaval space consistent with lymph node metastases. The largest index lymph node in the left paraaortic region measures 11 mm on image 104/6, with SUV max of 7.1. A sub-centimeter right retrocrural lymph node is seen on image 77/6 which has SUV max 4.2, also suspicious for metastatic disease. Liver: No evidence of liver metastasis. Incidental CT finding: None. SKELETON Multiple small sclerotic lesions with mild radiotracer accumulation are seen within the spine, bilateral ribs, left scapula, and pelvis, consistent with bone metastases. Index metastatic lesion in the left ilium has SUV max of 2.9. IMPRESSION: Widespread radiotracer accumulation throughout the prostate gland, consistent with known prostate carcinoma. Radiotracer accumulation within the left  lateral and  anterior walls of the rectum, with associated rectal wall thickening. Primary rectal carcinoma cannot be excluded; recommend correlation with rectal exam or colonoscopy. Small metastatic lymph nodes in the left internal iliac chain, bilateral common iliac chains, abdominal retroperitoneum, and right retrocrural region, consistent with metastatic disease. Sub-cm mediastinal and bilateral cervical lymph nodes with mild radiotracer accumulation. These are indeterminate; metastatic disease cannot be excluded. Diffuse small sclerotic bone metastases. Electronically Signed   By: Norleen DELENA Kil M.D.   On: 05/21/2024 10:12     Prostate cancer [facility]) # Prostate cancer- # Prostate biopsy 04/03/2024 42g prostate, all 12 cores with prostate adenocarcinoma, primarily high risk Gleason score 4+4=8 disease with max core involvement of 100%.  DRE grossly abnormal.  Transrectal ultrasound concerning for local extension at the bladder neck into the bladder.  Dr. Etheleen. JAN 2025- PSMA PET scan: Widespread radiotracer accumulation throughout the prostate gland, consistent with known prostate carcinoma. Radiotracer accumulation within the left lateral and anterior walls of the rectum, with associated rectal wall thickening. Primary rectal carcinoma cannot be excluded; recommend correlation with rectal exam or colonoscopy. Small metastatic lymph nodes in the left internal iliac chain, bilateral common iliac chains, abdominal retroperitoneum, and right retrocrural region, consistent with metastatic disease. Sub-cm mediastinal and bilateral cervical lymph nodes with mild radiotracer accumulation. These are indeterminate; metastatic disease cannot be excluded. Diffuse small sclerotic bone metastases.      # #I had a long discussion regarding the concerning findings progressive disease.  Again discussed at length the importance of starting ADT-to treat his progressive prostate cancer.  I reviewed the mechanism of action  of ADT/blocking testosterone.  Again reviewed the potential side effects including but not limited to-fatigue hot flashes loss of libido.  Also reviewed that bone health/cardiovascular as long-term complications.  I would recommend Firmagon .  Understands treatments are palliative not curative. Discussed treatments are in general indefinite; however treatment breaks were given based upon tolerance preference.   Discussed regarding use of novel androgen receptor antagonists.  Will discuss at next visit.  # weight loss/poor appetite secondary to underlying malignancy.-awaiting nutritional evaluation.   # Dementia- on aricept   # Palliative care evaluation: Introduced palliative care philosophy and services. I discussed the need for palliative care evaluation/symptom management to help quality of life in the context of incurable disease.  Patient's daughter is interested; will make referral.  Reine, daughter-   # DISPOSITION:  # as planned- shot on 1/19- # follow up in 5 weeks- MD; labs- cbc/cmp; iron studies; ferritin; PSA- Firmagon ; Palliative care evaluation- Dr.B  # I reviewed the blood work- with the patient in detail; also reviewed the imaging independently [as summarized above]; and with the patient in detail.   # 40 minutes face-to-face with the patient discussing the above plan of care; more than 50% of time spent on prognosis/ natural history; counseling and coordination.  Above plan of care was discussed with patient/family in detail.  My contact information was given to the patient/family.     Cindy JONELLE Joe, MD 05/24/2024 4:26 PM

## 2024-05-24 NOTE — Assessment & Plan Note (Addendum)
#   Prostate cancer- # Prostate biopsy 04/03/2024 42g prostate, all 12 cores with prostate adenocarcinoma, primarily high risk Gleason score 4+4=8 disease with max core involvement of 100%.  DRE grossly abnormal.  Transrectal ultrasound concerning for local extension at the bladder neck into the bladder.  Dr. Etheleen. JAN 2025- PSMA PET scan: Widespread radiotracer accumulation throughout the prostate gland, consistent with known prostate carcinoma. Radiotracer accumulation within the left lateral and anterior walls of the rectum, with associated rectal wall thickening. Primary rectal carcinoma cannot be excluded; recommend correlation with rectal exam or colonoscopy. Small metastatic lymph nodes in the left internal iliac chain, bilateral common iliac chains, abdominal retroperitoneum, and right retrocrural region, consistent with metastatic disease. Sub-cm mediastinal and bilateral cervical lymph nodes with mild radiotracer accumulation. These are indeterminate; metastatic disease cannot be excluded. Diffuse small sclerotic bone metastases.      # #I had a long discussion regarding the concerning findings progressive disease.  Again discussed at length the importance of starting ADT-to treat his progressive prostate cancer.  I reviewed the mechanism of action of ADT/blocking testosterone.  Again reviewed the potential side effects including but not limited to-fatigue hot flashes loss of libido.  Also reviewed that bone health/cardiovascular as long-term complications.  I would recommend Firmagon .  Understands treatments are palliative not curative. Discussed treatments are in general indefinite; however treatment breaks were given based upon tolerance preference.   Discussed regarding use of novel androgen receptor antagonists.  Will discuss at next visit.  # weight loss/poor appetite secondary to underlying malignancy.-awaiting nutritional evaluation.   # Dementia- on aricept   # Palliative care  evaluation: Introduced palliative care philosophy and services. I discussed the need for palliative care evaluation/symptom management to help quality of life in the context of incurable disease.  Patient's daughter is interested; will make referral.  Reine, daughter-   # DISPOSITION:  # as planned- shot on 1/19-Palliative care evaluation- # follow up in 5 weeks- MD; labs- cbc/cmp; iron studies; ferritin; PSA- Firmagon ;  Dr.B  # I reviewed the blood work- with the patient in detail; also reviewed the imaging independently [as summarized above]; and with the patient in detail.   # 40 minutes face-to-face with the patient discussing the above plan of care; more than 50% of time spent on prognosis/ natural history; counseling and coordination.

## 2024-05-31 ENCOUNTER — Inpatient Hospital Stay

## 2024-05-31 ENCOUNTER — Inpatient Hospital Stay: Admitting: Hospice and Palliative Medicine

## 2024-05-31 ENCOUNTER — Inpatient Hospital Stay: Admitting: Internal Medicine

## 2024-05-31 DIAGNOSIS — C61 Malignant neoplasm of prostate: Secondary | ICD-10-CM

## 2024-05-31 MED ORDER — DEGARELIX ACETATE 80 MG ~~LOC~~ SOLR
80.0000 mg | Freq: Once | SUBCUTANEOUS | Status: AC
  Administered 2024-05-31: 80 mg via SUBCUTANEOUS
  Filled 2024-05-31: qty 4

## 2024-05-31 NOTE — Progress Notes (Signed)
 Nutrition Assessment   Reason for Assessment:  Weight loss, decreased appetite   ASSESSMENT:  89 year old male with metastatic high risk prostate adenocarcinoma. Past medical history of dementia, HTN, PVC.  On Firmagon .  Met with patient and wife following injection.  Reports that he has not eaten anything since 3am (ate chicken salad sandwich then said peanut butter and jelly sandwich).  Usually goes to eat breakfast every morning either biscuit or at restaurant (eggs, sausage, grits, coffee).  Last night ate meatloaf and mashed potatoes and devil dog.  Also had pizza.  Says that he eats all day long when he is hungry.  Says that he is not eating as much as before.  Has ensure original at home but does not drink every day.     Medications: reviewed   Labs: reviewed   Anthropometrics:   Height: 66 inches Weight: 144 lb 1.6 oz on 1/9 160 lb 15 oz on 08/08/23 BMI: 23  11% weight loss in the last 10 months   Estimated Energy Needs  Kcals: 1625-1950 Protein: 81-98 g Fluid: 1625-1950 ml   NUTRITION DIAGNOSIS: Unintentional weight loss related to cancer ? Dementia as evidenced by 11% weight loss in the last 10 months and decreased intake   INTERVENTION:  Recommend 350 calorie oral nutrition supplement.  Encouraged patient to drink one daily at least Discussed ways to add calories and protein to foods.  Handout given Encouraged eating q 2-3 hours Contact information given   MONITORING, EVALUATION, GOAL: weight trends, intake   Next Visit: Thursday, Feb 12 phone call  Shubh Chiara B. Dasie SOLON, CSO, LDN Registered Dietitian [phone]

## 2024-06-05 ENCOUNTER — Encounter: Payer: Self-pay | Admitting: Family

## 2024-06-05 ENCOUNTER — Ambulatory Visit: Admitting: Family

## 2024-06-05 VITALS — BP 128/68 | HR 56 | Temp 97.5°F | Ht 66.0 in | Wt 146.4 lb

## 2024-06-05 DIAGNOSIS — I10 Essential (primary) hypertension: Secondary | ICD-10-CM

## 2024-06-05 DIAGNOSIS — B0229 Other postherpetic nervous system involvement: Secondary | ICD-10-CM

## 2024-06-05 DIAGNOSIS — J309 Allergic rhinitis, unspecified: Secondary | ICD-10-CM

## 2024-06-05 DIAGNOSIS — R200 Anesthesia of skin: Secondary | ICD-10-CM | POA: Diagnosis not present

## 2024-06-05 LAB — B12 AND FOLATE PANEL
Folate: 16.9 ng/mL
Vitamin B-12: 401 pg/mL (ref 211–911)

## 2024-06-05 MED ORDER — AZELASTINE HCL 0.1 % NA SOLN: 1.0000 | Freq: Two times a day (BID) | NASAL | 4 refills | Status: AC

## 2024-06-05 MED ORDER — GABAPENTIN 100 MG PO CAPS: 200.0000 mg | ORAL_CAPSULE | Freq: Every day | ORAL | 3 refills | Status: AC

## 2024-06-05 NOTE — Patient Instructions (Signed)
"   For blood pressure  Continue losartan  25 mg daily, metoprolol  succinate 50 mg daily.   As you have not been taking amlodipine  10 mg every day; I HAVE DISCONTINUED MEDICATION as blood pressure is very well controlled  Increase gabapentin  for numbness from 100mg  to 200mg  at bedtime; this medication can make you sleepy.     "

## 2024-06-05 NOTE — Assessment & Plan Note (Addendum)
 Chronic, stable.  Continue losartan  25 mg daily, metoprolol  succinate 50 mg daily. Per wife today, he has not been taking amlodipine  10 mg every day; I have dced as blood pressure very well controlled and concerned for potential hypotension.

## 2024-06-05 NOTE — Progress Notes (Unsigned)
" ° °  Assessment & Plan:  There are no diagnoses linked to this encounter.   Return precautions given.   Risks, benefits, and alternatives of the medications and treatment plan prescribed today were discussed, and patient expressed understanding.   Education regarding symptom management and diagnosis given to patient on AVS either electronically or printed.  No follow-ups on file.  Rollene Northern, FNP  Subjective:    Patient ID: Joshua Sandoval, male    DOB: December 06, 1932, 89 y.o.   MRN: [id number]  CC: Joshua Sandoval is a 89 y.o. male who presents today for follow up.   HPI: Accompanied by wife     Follow-up oncology 06/27/2024 metastatic prostate cancer, follow-up cardiology 06/28/2024   Allergies: Other Medications Ordered Prior to Encounter[1]  Review of Systems    Objective:    BP 128/68   Pulse (!) 56   Temp (!) 97.5 F (36.4 C) (Oral)   Ht 5' 6 (1.676 m)   Wt 146 lb 6.4 oz (66.4 kg)   SpO2 98%   BMI 23.63 kg/m  BP Readings from Last 3 Encounters:  06/05/24 128/68  05/24/24 136/69  04/26/24 131/84   Wt Readings from Last 3 Encounters:  06/05/24 146 lb 6.4 oz (66.4 kg)  05/24/24 144 lb 1.6 oz (65.4 kg)  04/26/24 142 lb 11.2 oz (64.7 kg)    Physical Exam        [1]  Current Outpatient Medications on File Prior to Visit  Medication Sig Dispense Refill   aspirin EC 81 MG tablet Take 81 mg by mouth as needed.      cyanocobalamin  (VITAMIN B12) 1000 MCG tablet Take 1 tablet (1,000 mcg total) by mouth daily. 90 tablet 3   gabapentin  (NEURONTIN ) 100 MG capsule Take 1 capsule (100 mg total) by mouth at bedtime as needed. 90 capsule 1   losartan  (COZAAR ) 25 MG tablet Take 1 tablet (25 mg total) by mouth daily. 90 tablet 3   metoprolol  succinate (TOPROL -XL) 50 MG 24 hr tablet Take 1 tablet (50 mg total) by mouth daily with or immediately following a meal.. 90 tablet 3   rosuvastatin  (CRESTOR ) 5 MG tablet Take 1 tablet (5 mg total) by mouth daily. 90 tablet 3   amLODipine   (NORVASC ) 10 MG tablet TAKE 1 TABLET(10 MG) BY MOUTH DAILY (Patient not taking: Reported on 06/05/2024) 90 tablet 3   donepezil  (ARICEPT ) 10 MG tablet Take 1 tablet (10 mg total) by mouth at bedtime. (Patient not taking: Reported on 06/05/2024) 90 tablet 3   No current facility-administered medications on file prior to visit.   "

## 2024-06-06 ENCOUNTER — Ambulatory Visit: Payer: Self-pay | Admitting: Family

## 2024-06-07 NOTE — Assessment & Plan Note (Signed)
 Chronic, suboptimal control .azelastine  nasal spray was prescribed.

## 2024-06-07 NOTE — Assessment & Plan Note (Addendum)
 He experiences chronic fingertip numbness, possibly due to low B12. Continue B12 supplementation.  Increase gabapentin  100 mg nightly to 200 mg nightly.  Monitor for sedation

## 2024-06-27 ENCOUNTER — Inpatient Hospital Stay

## 2024-06-28 ENCOUNTER — Inpatient Hospital Stay: Admitting: Hospice and Palliative Medicine

## 2025-03-10 ENCOUNTER — Ambulatory Visit
